# Patient Record
Sex: Male | Born: 1995
Health system: Southern US, Community
[De-identification: ages and names within clinical notes are randomized; demographics above are authoritative.]

## PROBLEM LIST (undated history)

## (undated) ENCOUNTER — Ambulatory Visit (HOSPITAL_COMMUNITY): Source: Home / Self Care

## (undated) DIAGNOSIS — Q893 Situs inversus: Secondary | ICD-10-CM

## (undated) DIAGNOSIS — R011 Cardiac murmur, unspecified: Secondary | ICD-10-CM

## (undated) DIAGNOSIS — A539 Syphilis, unspecified: Secondary | ICD-10-CM

## (undated) DIAGNOSIS — B20 Human immunodeficiency virus [HIV] disease: Secondary | ICD-10-CM

## (undated) DIAGNOSIS — J45909 Unspecified asthma, uncomplicated: Secondary | ICD-10-CM

## (undated) HISTORY — DX: Syphilis, unspecified: A53.9

## (undated) HISTORY — DX: Human immunodeficiency virus (HIV) disease: B20

---
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
## 2014-03-27 ENCOUNTER — Emergency Department (HOSPITAL_COMMUNITY)
Admission: EM | Admit: 2014-03-27 | Discharge: 2014-03-27 | Disposition: A | Discharge status: Home / Self Care | Payer: Medicaid Other | Attending: Emergency Medicine | Admitting: Emergency Medicine

## 2014-03-27 ENCOUNTER — Encounter (HOSPITAL_COMMUNITY): Payer: Self-pay | Admitting: Emergency Medicine

## 2014-03-27 ENCOUNTER — Emergency Department (HOSPITAL_COMMUNITY): Payer: Medicaid Other

## 2014-03-27 DIAGNOSIS — Z79899 Other long term (current) drug therapy: Secondary | ICD-10-CM | POA: Insufficient documentation

## 2014-03-27 DIAGNOSIS — J45901 Unspecified asthma with (acute) exacerbation: Secondary | ICD-10-CM | POA: Insufficient documentation

## 2014-03-27 DIAGNOSIS — IMO0002 Reserved for concepts with insufficient information to code with codable children: Secondary | ICD-10-CM | POA: Insufficient documentation

## 2014-03-27 DIAGNOSIS — Q893 Situs inversus: Secondary | ICD-10-CM | POA: Insufficient documentation

## 2014-03-27 DIAGNOSIS — R011 Cardiac murmur, unspecified: Secondary | ICD-10-CM | POA: Insufficient documentation

## 2014-03-27 DIAGNOSIS — J45909 Unspecified asthma, uncomplicated: Secondary | ICD-10-CM

## 2014-03-27 MED ORDER — ALBUTEROL SULFATE HFA 108 (90 BASE) MCG/ACT IN AERS: 2.0000 | INHALATION_SPRAY | RESPIRATORY_TRACT | Status: DC | PRN

## 2014-03-27 NOTE — Discharge Instructions (Signed)
Use the inhaler as needed for wheezing or shortness of breath. You can take ibuprofen 600 mg 4 times a day for fever. Recheck if you get a cough, sore throat, runny nose or struggle to breathe.  Asthma Attack Prevention Although there is no way to prevent asthma from starting, you can take steps to control the disease and reduce its symptoms. Learn about your asthma and how to control it. Take an active role to control your asthma by working with your health care provider to create and follow an asthma action plan. An asthma action plan guides you in:  Taking your medicines properly.  Avoiding things that set off your asthma or make your asthma worse (asthma triggers).  Tracking your level of asthma control.  Responding to worsening asthma.  Seeking emergency care when needed. To track your asthma, keep records of your symptoms, check your peak flow number using a handheld device that shows how well air moves out of your lungs (peak flow meter), and get regular asthma checkups.  WHAT ARE SOME WAYS TO PREVENT AN ASTHMA ATTACK?  Take medicines as directed by your health care provider.  Keep track of your asthma symptoms and level of control.  With your health care provider, write a detailed plan for taking medicines and managing an asthma attack. Then be sure to follow your action plan. Asthma is an ongoing condition that needs regular monitoring and treatment.  Identify and avoid asthma triggers. Many outdoor allergens and irritants (such as pollen, mold, cold air, and air pollution) can trigger asthma attacks. Find out what your asthma triggers are and take steps to avoid them.  Monitor your breathing. Learn to recognize warning signs of an attack, such as coughing, wheezing, or shortness of breath. Your lung function may decrease before you notice any signs or symptoms, so regularly measure and record your peak airflow with a home peak flow meter.  Identify and treat attacks early. If you  act quickly, you are less likely to have a severe attack. You will also need less medicine to control your symptoms. When your peak flow measurements decrease and alert you to an upcoming attack, take your medicine as instructed and immediately stop any activity that may have triggered the attack. If your symptoms do not improve, get medical help.  Pay attention to increasing quick-relief inhaler use. If you find yourself relying on your quick-relief inhaler, your asthma is not under control. See your health care provider about adjusting your treatment. WHAT CAN MAKE MY SYMPTOMS WORSE? A number of common things can set off or make your asthma symptoms worse and cause temporary increased inflammation of your airways. Keep track of your asthma symptoms for several weeks, detailing all the environmental and emotional factors that are linked with your asthma. When you have an asthma attack, go back to your asthma diary to see which factor, or combination of factors, might have contributed to it. Once you know what these factors are, you can take steps to control many of them. If you have allergies and asthma, it is important to take asthma prevention steps at home. Minimizing contact with the substance to which you are allergic will help prevent an asthma attack. Some triggers and ways to avoid these triggers are: Animal Dander:  Some people are allergic to the flakes of skin or dried saliva from animals with fur or feathers.   There is no such thing as a hypoallergenic dog or cat breed. All dogs or cats can cause  allergies, even if they don't shed.  Keep these pets out of your home.  If you are not able to keep a pet outdoors, keep the pet out of your bedroom and other sleeping areas at all times, and keep the door closed.  Remove carpets and furniture covered with cloth from your home. If that is not possible, keep the pet away from fabric-covered furniture and carpets. Dust Mites: Many people with  asthma are allergic to dust mites. Dust mites are tiny bugs that are found in every home in mattresses, pillows, carpets, fabric-covered furniture, bedcovers, clothes, stuffed toys, and other fabric-covered items.   Cover your mattress in a special dust-proof cover.  Cover your pillow in a special dust-proof cover, or wash the pillow each week in hot water. Water must be hotter than 130 F (54.4 C) to kill dust mites. Cold or warm water used with detergent and bleach can also be effective.  Wash the sheets and blankets on your bed each week in hot water.  Try not to sleep or lie on cloth-covered cushions.  Call ahead when traveling and ask for a smoke-free hotel room. Bring your own bedding and pillows in case the hotel only supplies feather pillows and down comforters, which may contain dust mites and cause asthma symptoms.  Remove carpets from your bedroom and those laid on concrete, if you can.  Keep stuffed toys out of the bed, or wash the toys weekly in hot water or cooler water with detergent and bleach. Cockroaches: Many people with asthma are allergic to the droppings and remains of cockroaches.   Keep food and garbage in closed containers. Never leave food out.  Use poison baits, traps, powders, gels, or paste (for example, boric acid).  If a spray is used to kill cockroaches, stay out of the room until the odor goes away. Indoor Mold:  Fix leaky faucets, pipes, or other sources of water that have mold around them.  Clean floors and moldy surfaces with a fungicide or diluted bleach.  Avoid using humidifiers, vaporizers, or swamp coolers. These can spread molds through the air. Pollen and Outdoor Mold:  When pollen or mold spore counts are high, try to keep your windows closed.  Stay indoors with windows closed from late morning to afternoon. Pollen and some mold spore counts are highest at that time.  Ask your health care provider whether you need to take  anti-inflammatory medicine or increase your dose of the medicine before your allergy season starts. Other Irritants to Avoid:  Tobacco smoke is an irritant. If you smoke, ask your health care provider how you can quit. Ask family members to quit smoking, too. Do not allow smoking in your home or car.  If possible, do not use a wood-burning stove, kerosene heater, or fireplace. Minimize exposure to all sources of smoke, including incense, candles, fires, and fireworks.  Try to stay away from strong odors and sprays, such as perfume, talcum powder, hair spray, and paints.  Decrease humidity in your home and use an indoor air cleaning device. Reduce indoor humidity to below 60%. Dehumidifiers or central air conditioners can do this.  Decrease house dust exposure by changing furnace and air cooler filters frequently.  Try to have someone else vacuum for you once or twice a week. Stay out of rooms while they are being vacuumed and for a short while afterward.  If you vacuum, use a dust mask from a hardware store, a double-layered or microfilter vacuum cleaner bag,  or a vacuum cleaner with a HEPA filter.  Sulfites in foods and beverages can be irritants. Do not drink beer or wine or eat dried fruit, processed potatoes, or shrimp if they cause asthma symptoms.  Cold air can trigger an asthma attack. Cover your nose and mouth with a scarf on cold or windy days.  Several health conditions can make asthma more difficult to manage, including a runny nose, sinus infections, reflux disease, psychological stress, and sleep apnea. Work with your health care provider to manage these conditions.  Avoid close contact with people who have a respiratory infection such as a cold or the flu, since your asthma symptoms may get worse if you catch the infection. Wash your hands thoroughly after touching items that may have been handled by people with a respiratory infection.  Get a flu shot every year to protect  against the flu virus, which often makes asthma worse for days or weeks. Also get a pneumonia shot if you have not previously had one. Unlike the flu shot, the pneumonia shot does not need to be given yearly. Medicines:  Talk to your health care provider about whether it is safe for you to take aspirin or non-steroidal anti-inflammatory medicines (NSAIDs). In a small number of people with asthma, aspirin and NSAIDs can cause asthma attacks. These medicines must be avoided by people who have known aspirin-sensitive asthma. It is important that people with aspirin-sensitive asthma read labels of all over-the-counter medicines used to treat pain, colds, coughs, and fever.  Beta-blockers and ACE inhibitors are other medicines you should discuss with your health care provider. HOW CAN I FIND OUT WHAT I AM ALLERGIC TO? Ask your asthma health care provider about allergy skin testing or blood testing (the RAST test) to identify the allergens to which you are sensitive. If you are found to have allergies, the most important thing to do is to try to avoid exposure to any allergens that you are sensitive to as much as possible. Other treatments for allergies, such as medicines and allergy shots (immunotherapy) are available.  CAN I EXERCISE? Follow your health care provider's advice regarding asthma treatment before exercising. It is important to maintain a regular exercise program, but vigorous exercise or exercise in cold, humid, or dry environments can cause asthma attacks, especially for those people who have exercise-induced asthma. Document Released: 09/14/2009 Document Revised: 10/01/2013 Document Reviewed: 04/03/2013 [employer] Patient Information 2015 [employer] [place]. This information is not intended to replace advice given to you by your health care provider. Make sure you discuss any questions you have with your health care provider.

## 2014-03-27 NOTE — ED Notes (Signed)
Having chest tightness and shortness of breath. Asthma attack before leaving home. Has been running a fever at home of 101 per patient. Used inhaler at home.

## 2014-03-27 NOTE — ED Provider Notes (Signed)
CSN: [id number]     Arrival date & time 03/27/14  1940 History   First MD Initiated Contact with Patient 03/27/14 1949 This chart was scribed for Ward GivensIva L Knapp, MD by Valera CastleSteven Perry, ED Scribe. This patient was seen in room APA01/APA01 and the patient's care was started at 8:07 PM.    Chief Complaint  Patient presents with  . Chest Pain  . Asthma   (Consider location/radiation/quality/duration/timing/severity/associated sxs/prior Treatment) HPI HPI Comments: Kelly Knight is a 18 y.o. male with h/o asthma, who presents to the Emergency Department complaining of an asthma flare up including symptoms of intermittent chest tightness, SOB, and wheezing, onset a few days ago but getting worse today. He has tried his inhaler with temporary relief, stating it helps for a few hours. He denies chest tightness, wheezing currently. He reports associated dry mouth. He states his last asthma flare up was last Winter. He denies h/o hospitalization for his asthma. He states he used to be put on steroids in his youth. He also reports associated chills and fever, with a max temperature of 102, onset last night around 7:00 PM. He denies chest pain, sore throat, rhinorrhea, cough, vomiting, diarrhea, and any other associated symptoms. He denies h/o smoking. He reports starting college Monday at [employer].   He denies having PCP due to just turning 17 and aging out at his pediatrician.  Past Medical History  Diagnosis Date  . Asthma   . Situs inversus   . Heart murmur    History reviewed. No pertinent past surgical history. No family history on file. History  Substance Use Topics  . Smoking status: Never Smoker   . Smokeless tobacco: Not on file  . Alcohol Use: No  college student  Review of Systems  Constitutional: Positive for fever (102) and chills.  HENT: Negative for rhinorrhea, sore throat and trouble swallowing.   Respiratory: Positive for chest tightness, shortness of breath and wheezing.    Cardiovascular: Negative for chest pain.  Gastrointestinal: Negative for nausea, vomiting and diarrhea.  All other systems reviewed and are negative.   Allergies  Sulfa antibiotics  Home Medications   Prior to Admission medications   Medication Sig Start Date End Date Taking? Authorizing Illona Bulman  acetaminophen (TYLENOL) 500 MG tablet Take 500 mg by mouth once as needed for pain (chest pain as a result of cough).   Yes Historical Tristain Daily, MD  albuterol (PROVENTIL HFA;VENTOLIN HFA) 108 (90 BASE) MCG/ACT inhaler Inhale 2 puffs into the lungs every 6 (six) hours as needed for wheezing.   Yes Historical Yannet Rincon, MD  albuterol (PROVENTIL) (2.5 MG/3ML) 0.083% nebulizer solution Take 2.5 mg by nebulization every 6 (six) hours as needed for wheezing.   Yes Historical Molley Houser, MD  beclomethasone (QVAR) 80 MCG/ACT inhaler Inhale 2 puffs into the lungs 2 (two) times daily.   Yes Historical Anarely Nicholls, MD  albuterol (PROVENTIL HFA;VENTOLIN HFA) 108 (90 BASE) MCG/ACT inhaler Inhale 2 puffs into the lungs every 4 (four) hours as needed. 03/27/14   Ward GivensIva L Knapp, MD   BP 122/79  Pulse 103  Temp(Src) 99.9 F (37.7 C) (Oral)  Resp 20  Ht 5\' 5"  (1.651 m)  Wt 145 lb 7 oz (65.97 kg)  BMI 24.20 kg/m2  SpO2 100%  Vital signs normal except tachycardia, low grade temp  Physical Exam  Nursing note and vitals reviewed. Constitutional: He is oriented to person, place, and time. He appears well-developed and well-nourished.  Non-toxic appearance. He does not appear ill. No distress.  HENT:  Head: Normocephalic and atraumatic.  Right Ear: External ear normal.  Left Ear: External ear normal.  Nose: Nose normal. No mucosal edema or rhinorrhea.  Mouth/Throat: Oropharynx is clear and moist. Mucous membranes are dry. No dental abscesses or uvula swelling.  Mouth is dry.  Eyes: Conjunctivae and EOM are normal. Pupils are equal, round, and reactive to light.  Neck: Normal range of motion and full passive range  of motion without pain. Neck supple.  Cardiovascular: Normal rate and regular rhythm.  Exam reveals no gallop and no friction rub.   Murmur (faint short systolic murmur heard base left lower sternal border) heard.  Systolic murmur is present  Pulmonary/Chest: Effort normal and breath sounds normal. No respiratory distress. He has no wheezes. He has no rhonchi. He has no rales. He exhibits no tenderness and no crepitus.  Abdominal: Soft. Normal appearance and bowel sounds are normal. He exhibits no distension. There is no tenderness. There is no rebound and no guarding.  Musculoskeletal: Normal range of motion. He exhibits no edema and no tenderness.  Moves all extremities well.   Neurological: He is alert and oriented to person, place, and time. He has normal strength. No cranial nerve deficit.  Skin: Skin is warm, dry and intact. No rash noted. No erythema. No pallor.  Psychiatric: He has a normal mood and affect. His speech is normal and behavior is normal. His mood appears not anxious.    ED Course  Procedures (including critical care time)  Medications - No data to display  DIAGNOSTIC STUDIES: Oxygen Saturation is 100% on room air, normal by my interpretation.    COORDINATION OF CARE: 8:14 PM-Discussed treatment plan which includes CXR with pt at bedside and pt agreed to plan. Patient agrees that he does not have any wheezing for any chest pain at this time. He does not want a breathing treatment at this time.  9:37 PM - Pt reports feeling improved. Discussed negative imaging results with pt. He denies chest tightness currently. Pt requests a Rx for another inhaler.   Dg Chest 2 View  03/27/2014   CLINICAL DATA:  Mid chest pain for 2 days. Fever. History of asthma.  EXAM: CHEST  2 VIEW  COMPARISON:  Chest radiograph performed 07/01/2005  FINDINGS: The lungs are well-aerated and clear. There is no evidence of focal opacification, pleural effusion or pneumothorax.  The heart is normal  in size; the mediastinal contour is within normal limits. No acute osseous abnormalities are seen.  IMPRESSION: No acute cardiopulmonary process seen.   Electronically Signed   By: Roanna RaiderJeffery  Chang M.D.   On: 03/27/2014 21:14     EKG Interpretation   Date/Time:  Thursday March 27 2014 19:54:03 EDT Ventricular Rate:  88 PR Interval:  158 QRS Duration: 83 QT Interval:  323 QTC Calculation: 391 R Axis:   102 Text Interpretation:  Sinus rhythm Borderline right axis deviation  Abnormal Q suggests anterior infarct Borderline T abnormalities, inferior  leads No old tracing to compare Confirmed by KNAPP  MD-I, IVA ([id number]) on  03/27/2014 9:38:42 PM      MDM   Final diagnoses:  Asthma    I personally performed the services described in this documentation, which was scribed in my presence. The recorded information has been reviewed and considered.  Devoria AlbeIva Knapp, MD, [employer]     Ward GivensIva L Knapp, MD 03/27/14 [phone]

## 2014-07-15 ENCOUNTER — Encounter (HOSPITAL_COMMUNITY): Payer: Self-pay | Admitting: Emergency Medicine

## 2014-07-15 ENCOUNTER — Emergency Department (HOSPITAL_COMMUNITY)
Admission: EM | Admit: 2014-07-15 | Discharge: 2014-07-15 | Disposition: A | Discharge status: Home / Self Care | Payer: Medicaid Other | Attending: Emergency Medicine | Admitting: Emergency Medicine

## 2014-07-15 DIAGNOSIS — Z79899 Other long term (current) drug therapy: Secondary | ICD-10-CM | POA: Insufficient documentation

## 2014-07-15 DIAGNOSIS — Q893 Situs inversus: Secondary | ICD-10-CM | POA: Insufficient documentation

## 2014-07-15 DIAGNOSIS — R011 Cardiac murmur, unspecified: Secondary | ICD-10-CM | POA: Insufficient documentation

## 2014-07-15 DIAGNOSIS — L02412 Cutaneous abscess of left axilla: Secondary | ICD-10-CM | POA: Insufficient documentation

## 2014-07-15 DIAGNOSIS — J45909 Unspecified asthma, uncomplicated: Secondary | ICD-10-CM | POA: Insufficient documentation

## 2014-07-15 DIAGNOSIS — Z7951 Long term (current) use of inhaled steroids: Secondary | ICD-10-CM | POA: Insufficient documentation

## 2014-07-15 MED ORDER — DOXYCYCLINE HYCLATE 100 MG PO CAPS: 100.0000 mg | ORAL_CAPSULE | Freq: Two times a day (BID) | ORAL | Status: DC

## 2014-07-15 MED ORDER — HYDROCODONE-ACETAMINOPHEN 5-325 MG PO TABS: ORAL_TABLET | ORAL | Status: DC

## 2014-07-15 MED ORDER — LIDOCAINE HCL (PF) 2 % IJ SOLN
10.0000 mL | Freq: Once | INTRAMUSCULAR | Status: DC
Start: 2014-07-15 — End: 2014-07-15
  Filled 2014-07-15: qty 10

## 2014-07-15 NOTE — ED Notes (Signed)
Abscess under left arm x 4 days

## 2014-07-15 NOTE — ED Notes (Signed)
Suture tray set up at bedside

## 2014-07-15 NOTE — ED Provider Notes (Signed)
CSN: [id number]     Arrival date & time 07/15/14  1249 History   First MD Initiated Contact with Patient 07/15/14 1259     Chief Complaint  Patient presents with  . Abscess     (Consider location/radiation/quality/duration/timing/severity/associated sxs/prior Treatment) HPI   Kelly Knight is a 18 y.o. male who presents to the Emergency Department complaining of pain, redness, and swelling of the left axilla for 4 days. He states that he has tried over-the-counter boil medication and warm compresses without relief. He states pain is worse with movement of his arm and improves if he keeps his arm above his head. He denies any fever, chills, nausea or vomiting, numbness or weakness to his arm. He he also denies use of new deodorant or  Past Medical History  Diagnosis Date  . Asthma   . Situs inversus   . Heart murmur    History reviewed. No pertinent past surgical history. No family history on file. History  Substance Use Topics  . Smoking status: Never Smoker   . Smokeless tobacco: Not on file  . Alcohol Use: No    Review of Systems  Constitutional: Negative for fever and chills.  Gastrointestinal: Negative for nausea and vomiting.  Musculoskeletal: Negative for arthralgias and joint swelling.  Skin: Positive for color change.       Abscess   Hematological: Negative for adenopathy.  All other systems reviewed and are negative.     Allergies  Sulfa antibiotics  Home Medications   Prior to Admission medications   Medication Sig Start Date End Date Taking? Authorizing Provider  acetaminophen (TYLENOL) 500 MG tablet Take 500 mg by mouth every 6 (six) hours as needed (pain).    Yes Historical Provider, MD  albuterol (PROVENTIL HFA;VENTOLIN HFA) 108 (90 BASE) MCG/ACT inhaler Inhale 2 puffs into the lungs every 6 (six) hours as needed for wheezing.   Yes Historical Provider, MD  albuterol (PROVENTIL) (2.5 MG/3ML) 0.083% nebulizer solution Take 2.5 mg by nebulization  every 6 (six) hours as needed for wheezing.   Yes Historical Provider, MD  beclomethasone (QVAR) 80 MCG/ACT inhaler Inhale 2 puffs into the lungs 2 (two) times daily.   Yes Historical Provider, MD  doxycycline (VIBRAMYCIN) 100 MG capsule Take 1 capsule (100 mg total) by mouth 2 (two) times daily. For 10 days 07/15/14   Summer Mccolgan L. Yusra Ravert, PA-C  HYDROcodone-acetaminophen (NORCO/VICODIN) 5-325 MG per tablet Take one-two tabs po q 4-6 hrs prn pain 07/15/14   Nikia Levels L. Eulas Schweitzer, PA-C   BP 124/66  Pulse 84  Temp(Src) 98.6 F (37 C) (Oral)  Resp 16  SpO2 100% Physical Exam  Nursing note and vitals reviewed. Constitutional: He is oriented to person, place, and time. He appears well-developed and well-nourished. No distress.  HENT:  Head: Normocephalic and atraumatic.  Cardiovascular: Normal rate, regular rhythm, normal heart sounds and intact distal pulses.   No murmur heard. Pulmonary/Chest: Effort normal and breath sounds normal. No respiratory distress.  Neurological: He is alert and oriented to person, place, and time. He exhibits normal muscle tone. Coordination normal.  Skin: Skin is warm and dry. There is erythema.  Abscess to the left axilla. Moderate fluctuance. Minimal erythema, no drainage    ED Course  Procedures (including critical care time) Labs Review Labs Reviewed - No data to display  Imaging Review No results found.   EKG Interpretation None       INCISION AND DRAINAGE Performed by: Maxwell CaulRIPLETT,Nehal Witting L. Consent: Verbal consent obtained. Risks and  benefits: risks, benefits and alternatives were discussed Type: abscess  Body area: Left axilla Anesthesia: local infiltration  Incision was made with a #11 scalpel.  Local anesthetic: lidocaine 2 % without epinephrine  Anesthetic total: 3 ml  Complexity: complex Blunt dissection to break up loculations  Drainage: purulent  Drainage amount: Copious  Packing material: 1/4 in iodoform gauze  Patient tolerance:  Patient tolerated the procedure well with no immediate complications.    MDM   Final diagnoses:  Abscess of axilla, left   Patient is well appearing. Vital signs are stable. He is nontoxic appearing. Localized abscess to the left axilla without significant cellulitis. He agrees to keep the area bandaged and packing to be removed in 2 days. Prescription for Vicodin and doxycycline. Patient agrees to return here for any worsening symptoms.     Annamae Shivley L. Dezmond Downie, PA-C 07/15/14 1425

## 2014-07-15 NOTE — Discharge Instructions (Signed)
Abscess °An abscess (boil or furuncle) is an infected area on or under the skin. This area is filled with yellowish-white fluid (pus) and other material (debris). °HOME CARE  °· Only take medicines as told by your doctor. °· If you were given antibiotic medicine, take it as directed. Finish the medicine even if you start to feel better. °· If gauze is used, follow your doctor's directions for changing the gauze. °· To avoid spreading the infection: °¨ Keep your abscess covered with a bandage. °¨ Wash your hands well. °¨ Do not share personal care items, towels, or whirlpools with others. °¨ Avoid skin contact with others. °· Keep your skin and clothes clean around the abscess. °· Keep all doctor visits as told. °GET HELP RIGHT AWAY IF:  °· You have more pain, puffiness (swelling), or redness in the wound site. °· You have more fluid or blood coming from the wound site. °· You have muscle aches, chills, or you feel sick. °· You have a fever. °MAKE SURE YOU:  °· Understand these instructions. °· Will watch your condition. °· Will get help right away if you are not doing well or get worse. °Document Released: 03/14/2008 Document Revised: 03/27/2012 Document Reviewed: 12/09/2011 °[employer]® Patient Information ©2015 [employer]. This information is not intended to replace advice given to you by your health care provider. Make sure you discuss any questions you have with your health care provider. ° °

## 2014-07-15 NOTE — ED Notes (Signed)
Telfa Dressing applied.Patient given discharge instruction, verbalized understand. Patient ambulatory out of the department.

## 2014-07-15 NOTE — ED Notes (Signed)
T Triplett at the bedside to lance abscess

## 2014-07-16 NOTE — ED Provider Notes (Signed)
Medical screening examination/treatment/procedure(s) were performed by non-physician practitioner and as supervising physician I was immediately available for consultation/collaboration.   EKG Interpretation None       Kamla Skilton, MD 07/16/14 0730 

## 2017-02-09 ENCOUNTER — Emergency Department (HOSPITAL_COMMUNITY): Payer: BLUE CROSS/BLUE SHIELD

## 2017-02-09 ENCOUNTER — Encounter (HOSPITAL_COMMUNITY): Payer: Self-pay | Admitting: *Deleted

## 2017-02-09 ENCOUNTER — Emergency Department (HOSPITAL_COMMUNITY)
Admission: EM | Admit: 2017-02-09 | Discharge: 2017-02-09 | Disposition: A | Discharge status: Home / Self Care | Payer: BLUE CROSS/BLUE SHIELD | Attending: Emergency Medicine | Admitting: Emergency Medicine

## 2017-02-09 DIAGNOSIS — Y92410 Unspecified street and highway as the place of occurrence of the external cause: Secondary | ICD-10-CM | POA: Insufficient documentation

## 2017-02-09 DIAGNOSIS — Y999 Unspecified external cause status: Secondary | ICD-10-CM | POA: Insufficient documentation

## 2017-02-09 DIAGNOSIS — S0990XA Unspecified injury of head, initial encounter: Secondary | ICD-10-CM | POA: Diagnosis not present

## 2017-02-09 DIAGNOSIS — S46812A Strain of other muscles, fascia and tendons at shoulder and upper arm level, left arm, initial encounter: Secondary | ICD-10-CM | POA: Insufficient documentation

## 2017-02-09 DIAGNOSIS — Z79899 Other long term (current) drug therapy: Secondary | ICD-10-CM | POA: Diagnosis not present

## 2017-02-09 DIAGNOSIS — Y9389 Activity, other specified: Secondary | ICD-10-CM | POA: Diagnosis not present

## 2017-02-09 DIAGNOSIS — S46811A Strain of other muscles, fascia and tendons at shoulder and upper arm level, right arm, initial encounter: Secondary | ICD-10-CM | POA: Diagnosis not present

## 2017-02-09 DIAGNOSIS — M25561 Pain in right knee: Secondary | ICD-10-CM | POA: Insufficient documentation

## 2017-02-09 DIAGNOSIS — S4991XA Unspecified injury of right shoulder and upper arm, initial encounter: Secondary | ICD-10-CM | POA: Diagnosis present

## 2017-02-09 DIAGNOSIS — S0083XA Contusion of other part of head, initial encounter: Secondary | ICD-10-CM

## 2017-02-09 DIAGNOSIS — J45909 Unspecified asthma, uncomplicated: Secondary | ICD-10-CM | POA: Insufficient documentation

## 2017-02-09 DIAGNOSIS — T148XXA Other injury of unspecified body region, initial encounter: Secondary | ICD-10-CM

## 2017-02-09 LAB — CBG MONITORING, ED: Glucose-Capillary: 88 mg/dL (ref 65–99)

## 2017-02-09 MED ORDER — CYCLOBENZAPRINE HCL 10 MG PO TABS
10.0000 mg | ORAL_TABLET | Freq: Once | ORAL | Status: AC
  Administered 2017-02-09: 10 mg via ORAL
  Filled 2017-02-09: qty 1

## 2017-02-09 MED ORDER — IBUPROFEN 800 MG PO TABS
800.0000 mg | ORAL_TABLET | Freq: Once | ORAL | Status: AC
  Administered 2017-02-09: 800 mg via ORAL
  Filled 2017-02-09: qty 1

## 2017-02-09 MED ORDER — CYCLOBENZAPRINE HCL 10 MG PO TABS: 10.0000 mg | ORAL_TABLET | Freq: Three times a day (TID) | ORAL | 0 refills | Status: DC

## 2017-02-09 MED ORDER — IBUPROFEN 600 MG PO TABS: 600.0000 mg | ORAL_TABLET | Freq: Four times a day (QID) | ORAL | 0 refills | Status: DC

## 2017-02-09 NOTE — ED Notes (Signed)
Patient transported to CT 
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
## 2017-02-09 NOTE — ED Notes (Signed)
Patient transported to X-ray 
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
## 2017-02-14 ENCOUNTER — Ambulatory Visit: Payer: Self-pay | Admitting: Family Medicine

## 2017-03-02 ENCOUNTER — Emergency Department (HOSPITAL_COMMUNITY)
Admission: EM | Admit: 2017-03-02 | Discharge: 2017-03-02 | Disposition: A | Discharge status: Home / Self Care | Payer: BLUE CROSS/BLUE SHIELD | Attending: Emergency Medicine | Admitting: Emergency Medicine

## 2017-03-02 ENCOUNTER — Encounter (HOSPITAL_COMMUNITY): Payer: Self-pay

## 2017-03-02 DIAGNOSIS — K0889 Other specified disorders of teeth and supporting structures: Secondary | ICD-10-CM | POA: Diagnosis present

## 2017-03-02 DIAGNOSIS — K029 Dental caries, unspecified: Secondary | ICD-10-CM | POA: Insufficient documentation

## 2017-03-02 DIAGNOSIS — J45909 Unspecified asthma, uncomplicated: Secondary | ICD-10-CM | POA: Diagnosis not present

## 2017-03-02 MED ORDER — IBUPROFEN 600 MG PO TABS: 600.0000 mg | ORAL_TABLET | Freq: Four times a day (QID) | ORAL | 0 refills | Status: DC | PRN

## 2017-03-02 MED ORDER — PENICILLIN V POTASSIUM 500 MG PO TABS: 500.0000 mg | ORAL_TABLET | Freq: Four times a day (QID) | ORAL | 0 refills | Status: AC

## 2017-03-02 NOTE — Discharge Instructions (Signed)
Keep your dental appt for next week.  Warm salt water rinses. °

## 2017-03-02 NOTE — ED Provider Notes (Signed)
[facility] Provider Note   CSN: [id number] Arrival date & time: 03/02/17  1044     History   Chief Complaint Chief Complaint  Patient presents with  . Dental Pain    HPI Kelly Knight is a 21 y.o. male.  HPI   Kelly Knight is a 21 y.o. male who presents to the Emergency Department complaining of dental pain for nearly one week.  Pain has been increasing in severity.  Taking ibuprofen with some improvement.  Describes a sharp pain to left lower molars that radiates to left face and ear.  Worse with chewing.  He denies facial swelling, fever, chills, neck pain or difficulty swallowing.   Past Medical History:  Diagnosis Date  . Asthma   . Heart murmur   . Situs inversus     There are no active problems to display for this patient.   History reviewed. No pertinent surgical history.     Home Medications    Prior to Admission medications   Medication Sig Start Date End Date Taking? Authorizing Provider  acetaminophen (TYLENOL) 500 MG tablet Take 500 mg by mouth every 6 (six) hours as needed (pain).     [provider]  albuterol (PROVENTIL HFA;VENTOLIN HFA) 108 (90 BASE) MCG/ACT inhaler Inhale 2 puffs into the lungs every 6 (six) hours as needed for wheezing.    [provider]  albuterol (PROVENTIL) (2.5 MG/3ML) 0.083% nebulizer solution Take 2.5 mg by nebulization every 6 (six) hours as needed for wheezing.    [provider]  beclomethasone (QVAR) 80 MCG/ACT inhaler Inhale 2 puffs into the lungs 2 (two) times daily.    [provider]  cyclobenzaprine (FLEXERIL) 10 MG tablet Take 1 tablet (10 mg total) by mouth 3 (three) times daily. 02/09/17   Ivery Quale, PA-C  doxycycline (VIBRAMYCIN) 100 MG capsule Take 1 capsule (100 mg total) by mouth 2 (two) times daily. For 10 days 07/15/14   Pauline Aus, PA-C  HYDROcodone-acetaminophen (NORCO/VICODIN) 5-325 MG per tablet Take one-two tabs po q 4-6 hrs prn pain 07/15/14    Alexsis Branscom, PA-C  ibuprofen (ADVIL,MOTRIN) 600 MG tablet Take 1 tablet (600 mg total) by mouth 4 (four) times daily. 02/09/17   Ivery Quale, PA-C    Family History No family history on file.  Social History Social History  Substance Use Topics  . Smoking status: Never Smoker  . Smokeless tobacco: Never Used  . Alcohol use No     Allergies   Shrimp [shellfish allergy] and Sulfa antibiotics   Review of Systems Review of Systems  Constitutional: Negative for appetite change and fever.  HENT: Positive for dental problem. Negative for congestion, facial swelling, sore throat and trouble swallowing.   Eyes: Negative for pain and visual disturbance.  Musculoskeletal: Negative for neck pain and neck stiffness.  Neurological: Negative for dizziness, facial asymmetry and headaches.  Hematological: Negative for adenopathy.  All other systems reviewed and are negative.    Physical Exam Updated Vital Signs BP 136/86 (BP Location: Right Arm)   Pulse 93   Temp 98 F (36.7 C) (Oral)   Resp 15   Ht 5\' 10"  (1.778 m)   Wt 63.5 kg (140 lb)   SpO2 100%   BMI 20.09 kg/m   Physical Exam  Constitutional: He is oriented to person, place, and time. He appears well-developed and well-nourished. No distress.  HENT:  Head: Normocephalic and atraumatic.  Right Ear: Tympanic membrane and ear canal normal.  Left Ear:  Tympanic membrane and ear canal normal.  Mouth/Throat: Uvula is midline, oropharynx is clear and moist and mucous membranes are normal. No trismus in the jaw. Dental caries present. No dental abscesses or uvula swelling.  ttp of the left lower second and third molars. No obvious decay.  No facial swelling, obvious dental abscess, trismus, or sublingual abnml.    Neck: Normal range of motion. Neck supple.  Cardiovascular: Normal rate, regular rhythm and normal heart sounds.   No murmur heard. Pulmonary/Chest: Effort normal and breath sounds normal.  Musculoskeletal: Normal  range of motion.  Lymphadenopathy:    He has no cervical adenopathy.  Neurological: He is alert and oriented to person, place, and time. He exhibits normal muscle tone. Coordination normal.  Skin: Skin is warm and dry.  Nursing note and vitals reviewed.    ED Treatments / Results  Labs (all labs ordered are listed, but only abnormal results are displayed) Labs Reviewed - No data to display  EKG  EKG Interpretation None       Radiology No results found.  Procedures Procedures (including critical care time)  Medications Ordered in ED Medications - No data to display   Initial Impression / Assessment and Plan / ED Course  I have reviewed the triage vital signs and the nursing notes.  Pertinent labs & imaging results that were available during my care of the patient were reviewed by me and considered in my medical decision making (see chart for details).    Pt well appearing.  Airway patent.  No concerning sx's for Ludwig's angina or dental abscess.  Pt agrees to f/u with his dentist.     Final Clinical Impressions(s) / ED Diagnoses   Final diagnoses:  Pain, dental    New Prescriptions New Prescriptions   No medications on file     Pauline Ausriplett, Toby Breithaupt, Cordelia Poche-C 03/04/17 1749    Mancel BaleWentz, Elliott, MD 03/07/17 1319

## 2017-03-02 NOTE — ED Triage Notes (Signed)
Pt. Is complaining of lower wisdom tooth pain on the left side. States pain started on Friday and has taken 600mg  ibuprofen for the pain, but hasn't had any relief. No drainage coming from tooth, just extreme pain.

## 2018-12-21 ENCOUNTER — Other Ambulatory Visit: Payer: Self-pay | Admitting: *Deleted

## 2018-12-21 ENCOUNTER — Ambulatory Visit: Payer: Self-pay

## 2018-12-21 ENCOUNTER — Other Ambulatory Visit: Payer: Self-pay

## 2018-12-21 DIAGNOSIS — B20 Human immunodeficiency virus [HIV] disease: Secondary | ICD-10-CM

## 2018-12-21 DIAGNOSIS — Z113 Encounter for screening for infections with a predominantly sexual mode of transmission: Secondary | ICD-10-CM

## 2018-12-21 DIAGNOSIS — Z79899 Other long term (current) drug therapy: Secondary | ICD-10-CM

## 2019-01-17 ENCOUNTER — Other Ambulatory Visit (HOSPITAL_COMMUNITY)
Admission: RE | Admit: 2019-01-17 | Discharge: 2019-01-17 | Disposition: A | Discharge status: Home / Self Care | Payer: Medicaid Other | Source: Ambulatory Visit | Attending: Infectious Diseases | Admitting: Infectious Diseases

## 2019-01-17 ENCOUNTER — Ambulatory Visit (INDEPENDENT_AMBULATORY_CARE_PROVIDER_SITE_OTHER): Payer: Self-pay | Admitting: Pharmacist

## 2019-01-17 ENCOUNTER — Ambulatory Visit (INDEPENDENT_AMBULATORY_CARE_PROVIDER_SITE_OTHER): Payer: Self-pay | Admitting: Infectious Diseases

## 2019-01-17 ENCOUNTER — Encounter: Payer: Self-pay | Admitting: Infectious Diseases

## 2019-01-17 ENCOUNTER — Telehealth: Payer: Self-pay | Admitting: Pharmacy Technician

## 2019-01-17 ENCOUNTER — Other Ambulatory Visit: Payer: Self-pay

## 2019-01-17 VITALS — BP 130/80 | HR 85 | Temp 98.3°F | Wt 164.0 lb

## 2019-01-17 DIAGNOSIS — B20 Human immunodeficiency virus [HIV] disease: Secondary | ICD-10-CM | POA: Insufficient documentation

## 2019-01-17 DIAGNOSIS — Z113 Encounter for screening for infections with a predominantly sexual mode of transmission: Secondary | ICD-10-CM

## 2019-01-17 DIAGNOSIS — Z79899 Other long term (current) drug therapy: Secondary | ICD-10-CM

## 2019-01-17 DIAGNOSIS — A539 Syphilis, unspecified: Secondary | ICD-10-CM

## 2019-01-17 MED ORDER — EMTRICITABINE-TENOFOVIR AF 200-25 MG PO TABS: 1.0000 | ORAL_TABLET | Freq: Every day | ORAL | 5 refills | Status: DC

## 2019-01-17 MED ORDER — DOLUTEGRAVIR SODIUM 50 MG PO TABS: 50.0000 mg | ORAL_TABLET | Freq: Every day | ORAL | 5 refills | Status: DC

## 2019-01-17 MED FILL — DESCOVY 200-25 MG TABS: 200-25 | 30 days supply | Qty: 30 | Fill #0

## 2019-01-17 MED FILL — TIVICAY 50 MG TABLET: 50 | 30 days supply | Qty: 30 | Fill #0

## 2019-01-17 NOTE — Progress Notes (Signed)
HPI: Kelly Knight is a 23 y.o. male who presents to the [facility] clinic today to initiate care with Judeth CornfieldStephanie for his newly diagnosed HIV infection.  There are no active problems to display for this patient.   Patient's Medications  New Prescriptions   No medications on file  Previous Medications   ACETAMINOPHEN (TYLENOL) 500 MG TABLET    Take 500 mg by mouth every 6 (six) hours as needed (pain).    ALBUTEROL (PROVENTIL HFA;VENTOLIN HFA) 108 (90 BASE) MCG/ACT INHALER    Inhale 2 puffs into the lungs every 6 (six) hours as needed for wheezing.   ALBUTEROL (PROVENTIL) (2.5 MG/3ML) 0.083% NEBULIZER SOLUTION    Take 2.5 mg by nebulization every 6 (six) hours as needed for wheezing.   BECLOMETHASONE (QVAR) 80 MCG/ACT INHALER    Inhale 2 puffs into the lungs 2 (two) times daily.   CYCLOBENZAPRINE (FLEXERIL) 10 MG TABLET    Take 1 tablet (10 mg total) by mouth 3 (three) times daily.   DOLUTEGRAVIR (TIVICAY) 50 MG TABLET    Take 1 tablet (50 mg total) by mouth daily.   DOXYCYCLINE (VIBRAMYCIN) 100 MG CAPSULE    Take 1 capsule (100 mg total) by mouth 2 (two) times daily. For 10 days   EMTRICITABINE-TENOFOVIR AF (DESCOVY) 200-25 MG TABLET    Take 1 tablet by mouth daily.   HYDROCODONE-ACETAMINOPHEN (NORCO/VICODIN) 5-325 MG PER TABLET    Take one-two tabs po q 4-6 hrs prn pain   IBUPROFEN (ADVIL,MOTRIN) 600 MG TABLET    Take 1 tablet (600 mg total) by mouth every 6 (six) hours as needed.  Modified Medications   No medications on file  Discontinued Medications   No medications on file    Allergies: Allergies  Allergen Reactions  . Shrimp [Shellfish Allergy]   . Sulfa Antibiotics Rash    Past Medical History: Past Medical History:  Diagnosis Date  . Asthma   . Heart murmur   . Situs inversus     Social History: Social History   Socioeconomic History  . Marital status: Single    Spouse name: Not on file  . Number of children: Not on file  . Years of education: Not on file  .  Highest education level: Not on file  Occupational History  . Not on file  Social Needs  . Financial resource strain: Not on file  . Food insecurity:    Worry: Not on file    Inability: Not on file  . Transportation needs:    Medical: Not on file    Non-medical: Not on file  Tobacco Use  . Smoking status: Never Smoker  . Smokeless tobacco: Never Used  Substance and Sexual Activity  . Alcohol use: No    Comment: socially   . Drug use: No  . Sexual activity: Not Currently    Partners: Male  Lifestyle  . Physical activity:    Days per week: Not on file    Minutes per session: Not on file  . Stress: Not on file  Relationships  . Social connections:    Talks on phone: Not on file    Gets together: Not on file    Attends religious service: Not on file    Active member of club or organization: Not on file    Attends meetings of clubs or organizations: Not on file    Relationship status: Not on file  Other Topics Concern  . Not on file  Social History Narrative  .  Not on file    Labs: No results found for: HIV1RNAQUANT, HIV1RNAVL, CD4TABS  RPR and STI No results found for: LABRPR, RPRTITER  No flowsheet data found.  Hepatitis B No results found for: HEPBSAB, HEPBSAG, HEPBCAB Hepatitis C No results found for: HEPCAB, HCVRNAPCRQN Hepatitis A No results found for: HAV Lipids: No results found for: CHOL, TRIG, HDL, CHOLHDL, VLDL, LDLCALC  Current HIV Regimen: Treatment naive  Assessment: Kelly Knight is here today to initiate care with Judeth Cornfield for his newly diagnosed HIV infection.  He is treatment naive but we do not have any current labs on him yet as he missed his initial lab appointment a few weeks ago.  He will get labs drawn today.  He has issues with swallowing pills and is requesting something small or something that can be crushed.  Will start him on Tivicay and Descovy.  Explained that Tivicay and Descovy are two pills once daily with or without food.  Explained to him the importance of not missing any doses. Explained resistance and how it develops and why it is so important to take both medications daily and not skip days or doses. Counseled on the importance of never taking one without the other. Counseled him to take it around the same time each day. Counseled on what to do if dose is missed, if closer to missed dose take immediately, if closer to next dose then skip and resume normal schedule.   Cautioned on possible side effects the first week or so including nausea, diarrhea, dizziness, and headaches but that they should resolve after the first couple of weeks. I reviewed patient medications and found no drug interactions. Counseled patient to separate Tivicay from divalent cations including multivitamins. Discussed with patient to call clinic if he starts a new medication or herbal supplement. I gave the patient my card and told him to call me with any issues/questions/concerns.  He is uninsured and applied for HMAP today. Kathie Rhodes was able to get him a 30 day supply of both Tivicay and Descovy and he will go pick up today at [facility].  Plan: - Start Tivicay and Descovy once daily  Isak Sotomayor L. Gildardo Tickner, PharmD, BCIDP, AAHIVP, CPP Infectious Diseases Clinical Pharmacist [facility] 01/17/2019, 3:43 PM

## 2019-01-17 NOTE — Progress Notes (Signed)
Name: Kelly Knight  DOB: December 24, 1995 MRN: [id number] PCP: Patient, No Pcp Per    Patient Active Problem List   Diagnosis Date Noted   HIV (human immunodeficiency virus infection) ([place]) 01/23/2019   Syphilis 01/23/2019     Brief Narrative:  Kelly Knight is a 23 y.o. male with HIV disease, Dx 10/2018. CD4 nadir unknown.  HIV Risk: MSM History of OIs: none Entry labs not yet drawn  Previous Regimens:  naive  Genotypes:  Not yet drawn   Subjective:   Chief Complaint  Patient presents with   New Patient (Initial Visit)     HPI: Kelly Knight is in for his first visit for HIV care. He was diagnosed formally in January of 2020 in the setting of routine asymptomatic screening for STIs through the [facility]. He reports that he is a versatile MSM partner with inconsistent condom use. He is not currently sexually active and has not been since finding out about his HIV test. He reports that he has had a little bit of a hard time coming to grips with this diagnosis but feels better after discussing with some friends whom are on treatment and after doing a little research on his own. He reports that he often has a hard time swallowing pills and sometimes needs to crush medications. He has not yet discussed results with his parents but has discussed with his aunt whom is very supportive. He would like to bring her at future visit if possible. He reports no symptoms or concerns today suggestive of associated opportunistic infection or advancing HIV disease such as fevers, night sweats, weight loss, anorexia, cough, SOB, nausea, vomiting, diarrhea, headache, sensory changes, lymphadenopathy or oral thrush.    He has not yet been treated for his syphilis infection but plans to go to [facility] this week for treatment. He has no symptoms and denies ever having had any distinct rash but does recall a genital ulcer in the past but cannot recall specifically when. Denies any rashes, vision changes, headaches,  ringing in the ears, dizziness or neck pain.    Depression screen PHQ 2/9 01/17/2019  Decreased Interest 0  Down, Depressed, Hopeless 1  PHQ - 2 Score 1   He does not have PCP. Uncertain as to childhood vaccine history but was born in [place] and thinks he got all his shots. No other significant health history.   Review of Systems  Constitutional: Negative for chills, fever, malaise/fatigue and weight loss.  HENT: Negative for sore throat.   Respiratory: Negative for cough, sputum production and shortness of breath.   Cardiovascular: Negative.   Gastrointestinal: Negative for abdominal pain, diarrhea and vomiting.  Musculoskeletal: Negative for joint pain, myalgias and neck pain.  Skin: Negative for rash.  Neurological: Negative for headaches.  Psychiatric/Behavioral: Negative for depression and substance abuse. The patient is not nervous/anxious.     Past Medical History:  Diagnosis Date   Asthma    Heart murmur    HIV (human immunodeficiency virus infection) ([place]) 01/23/2019   Situs inversus    Syphilis 01/23/2019    Outpatient Medications Prior to Visit  Medication Sig Dispense Refill   acetaminophen (TYLENOL) 500 MG tablet Take 500 mg by mouth every 6 (six) hours as needed (pain).      albuterol (PROVENTIL HFA;VENTOLIN HFA) 108 (90 BASE) MCG/ACT inhaler Inhale 2 puffs into the lungs every 6 (six) hours as needed for wheezing.     albuterol (PROVENTIL) (2.5 MG/3ML) 0.083% nebulizer solution Take 2.5 mg  by nebulization every 6 (six) hours as needed for wheezing.     ibuprofen (ADVIL,MOTRIN) 600 MG tablet Take 1 tablet (600 mg total) by mouth every 6 (six) hours as needed. 30 tablet 0   beclomethasone (QVAR) 80 MCG/ACT inhaler Inhale 2 puffs into the lungs 2 (two) times daily.     cyclobenzaprine (FLEXERIL) 10 MG tablet Take 1 tablet (10 mg total) by mouth 3 (three) times daily. (Patient not taking: Reported on 01/17/2019) 20 tablet 0   doxycycline (VIBRAMYCIN) 100 MG capsule  Take 1 capsule (100 mg total) by mouth 2 (two) times daily. For 10 days (Patient not taking: Reported on 01/17/2019) 20 capsule 0   HYDROcodone-acetaminophen (NORCO/VICODIN) 5-325 MG per tablet Take one-two tabs po q 4-6 hrs prn pain (Patient not taking: Reported on 01/17/2019) 20 tablet 0   No facility-administered medications prior to visit.      Allergies  Allergen Reactions   Shrimp [Shellfish Allergy]    Sulfa Antibiotics Rash    Social History   Tobacco Use   Smoking status: Never Smoker   Smokeless tobacco: Never Used  Substance Use Topics   Alcohol use: No    Comment: socially    Drug use: No    Family History  Problem Relation Age of Onset   Diabetes Mother    Hypertension Mother    Hypertension Father     Social History   Substance and Sexual Activity  Sexual Activity Not Currently   Partners: Male     Objective:   Vitals:   01/17/19 1356  BP: 130/80  Pulse: 85  Temp: 98.3 F (36.8 C)  TempSrc: Oral  Weight: 164 lb (74.4 kg)   Body mass index is 23.53 kg/m.  Physical Exam Constitutional:      Appearance: He is well-developed.     Comments: Seated comfortably in chair during visit.   HENT:     Mouth/Throat:     Mouth: Mucous membranes are moist.     Dentition: Normal dentition. No dental abscesses.     Pharynx: Oropharynx is clear. No oropharyngeal exudate.  Eyes:     General: No scleral icterus.    Conjunctiva/sclera: Conjunctivae normal.     Pupils: Pupils are equal, round, and reactive to light.  Cardiovascular:     Rate and Rhythm: Normal rate and regular rhythm.     Heart sounds: Normal heart sounds.  Pulmonary:     Effort: Pulmonary effort is normal.     Breath sounds: Normal breath sounds.  Abdominal:     General: There is no distension.     Palpations: Abdomen is soft.     Tenderness: There is no abdominal tenderness.  Lymphadenopathy:     Cervical: No cervical adenopathy.  Skin:    General: Skin is warm and dry.      Capillary Refill: Capillary refill takes less than 2 seconds.     Findings: No rash.  Neurological:     Mental Status: He is alert and oriented to person, place, and time.  Psychiatric:        Judgment: Judgment normal.     Comments: In good spirits today and engaged in care discussion.      Lab Results Lab Results  Component Value Date   WBC 6.4 01/17/2019   HGB 13.1 (L) 01/17/2019   HCT 39.8 01/17/2019   MCV 82.9 01/17/2019   PLT 235 01/17/2019    Lab Results  Component Value Date   CREATININE 0.88 01/17/2019  BUN 14 01/17/2019   NA 134 (L) 01/17/2019   K 3.9 01/17/2019   CL 100 01/17/2019   CO2 25 01/17/2019    Lab Results  Component Value Date   ALT 23 01/17/2019   AST 20 01/17/2019   ALKPHOS 265 02/09/2010   BILITOT 0.3 01/17/2019    Lab Results  Component Value Date   CHOL 131 01/17/2019   HDL 31 (L) 01/17/2019   LDLCALC 84 01/17/2019   TRIG 69 01/17/2019   CHOLHDL 4.2 01/17/2019   CD4 T Cell Abs (/uL)  Date Value  01/17/2019 360 (L)     Assessment & Plan:   Problem List Items Addressed This Visit      Unprioritized   HIV (human immunodeficiency virus infection) ([place]) - Primary (Chronic)    New patient here to establish for HIV care. I discussed with [name] treatment options/side effects, benefits of treatment and long-term outcomes. I discussed how HIV is transmitted and the process of untreated HIV including increased risk for opportunistic infections, cancer, dementia and renal failure. Patient was counseled on routine HIV care including medication adherence, blood monitoring, necessary vaccines and follow up visits. Counseled regarding safe sex practices including: condom use, partner disclosure, limiting partners. Patient spent time talking with our pharmacist Cassie regarding successful practices of ART and understands to reach out to our clinic in the future with questions.   Kelly Knight start [place] for HIV treatment given his  desire for crushable regimen. He is uninsured and Kelly Knight need referral to financial team to obtain HMAP/RW (he met with Timmothy Sours today.) Kelly Knight return to clinic in 4 weeks to review lab work and continue HIV education. Discussed interval for re-application today and services provided on  HMAP.   General introduction to our clinic and integrated services. Discussed counseling and THP services/resources today. Dental referral currently unable to be placed with current COVID-19 pandemic. No acute needs.   I spent greater than 45 minutes with the patient today. Greater than 50% of the time spent face-to-face counseling and coordination of care re: HIV and health maintenance.        Relevant Medications   dolutegravir (TIVICAY) 50 MG tablet   emtricitabine-tenofovir AF (DESCOVY) 200-25 MG tablet   Other Relevant Orders   HIV-1/2 AB - differentiation (Completed)   Syphilis    Latent syphilis of unknown duration. Untreated currently but with plans to go to [facility] this [facility] for treatment. He Kelly Knight need weekly IM injections of 2.4 million units bicillin x 3. Follow titers every 3-3m       Relevant Medications   dolutegravir (TIVICAY) 50 MG tablet   emtricitabine-tenofovir AF (DESCOVY) 200-25 MG tablet    Other Visit Diagnoses    High risk medication use       Screening examination for venereal disease       Relevant Orders   Rpr titer (Completed)   Fluorescent treponemal ab(fta)-IgG-bld (Completed)     Kelly Knight give prevnar at upcoming visit (declined today) and appropriate hepatitis vaccine boosters as indicated.   SJanene Madeira MSN, NP-C RGundersen St Josephs Hlth Svcsfor Infectious DCoburgPager: [phone]: [phone] 01/23/19  1:50 PM

## 2019-01-17 NOTE — Telephone Encounter (Signed)
[facility] Patient Advocate Encounter  Completed and sent [employer] Advancing Access application for Descovy for this patient who is uninsured.   Patient is approved for 30 days to allow for ADAP to be approved.  BIN [id number] PCN    [id number] GRP     [id number] ID       [id number]   Completed and sent [employer] patient assistance application for Tivicay for this patient who is uninsured. Patient is approved for 3- 30 day fills to allow for ADAP approval. BIN [id number] PCN    [id number] GRP    [id number] ID     [id number]  Kathie Rhodes E. Dimas Aguas CPhT Specialty Pharmacy Patient [facility] Phone: [phone] Fax:  [phone]

## 2019-01-17 NOTE — Patient Instructions (Addendum)
Very nice to meet you today!  The medications that I want start you on today are called Tivicay and Descovy.  The Tivicay is a small circle yellow pill and the Descovy is a small blue rectangle pill.  These both can be crushed and taken with applesauce as you need.  Please take them always together at the same time every day.  Side effects initially can include some mild headache (please take Tylenol to treat), loose bowel movements.  These typically resolve with 3 to 4 weeks of treatment.  You are more than welcome to bring your aunt with you to your next appointment with me in 2 months.  Please sign up for your my chart so you can see your lab work results from today.  Please come back in 4 weeks after you start your medication to meet with our pharmacy team so you can review your lab work and we will repeat your viral load.  Would like to give you your pneumonia vaccine at your return visit.

## 2019-01-18 LAB — URINALYSIS
Bilirubin Urine: NEGATIVE
Glucose, UA: NEGATIVE
Hgb urine dipstick: NEGATIVE
Ketones, ur: NEGATIVE
Nitrite: NEGATIVE
Protein, ur: NEGATIVE
Specific Gravity, Urine: 1.028 (ref 1.001–1.03)
pH: 7.5 (ref 5.0–8.0)

## 2019-01-18 LAB — T-HELPER CELL (CD4) - (RCID CLINIC ONLY)
CD4 % Helper T Cell: 10 % — ABNORMAL LOW (ref 33–55)
CD4 T Cell Abs: 360 /uL — ABNORMAL LOW (ref 400–2700)

## 2019-01-21 LAB — URINE CYTOLOGY ANCILLARY ONLY
Chlamydia: POSITIVE — AB
Neisseria Gonorrhea: POSITIVE — AB

## 2019-01-21 NOTE — Progress Notes (Signed)
Attempted to call Kelly Knight about his labs but not able to leave voice mail on his phone - his syphilis titer is extremely elevated. [facility] - I want to make sure he has received his first of 3 injections. If he has not would like to offer him treatment at [facility].   Please try to call him back later this afternoon please. I want to know if he is having any headaches, vision changes, vision loss, blurry vision, neck pain, ringing in the ears, feels off-balance. If the answers to this is yes Kelly Knight likely need to refer to ER for evaluation of neurosyphilis. I am happy to talk with him further should he call back - feel free to text/page me if he does.   I Kelly Knight send a MyChart to also get in touch with him.

## 2019-01-22 ENCOUNTER — Telehealth: Payer: Self-pay | Admitting: *Deleted

## 2019-01-22 NOTE — Telephone Encounter (Signed)
Per Kelly Knight called and left a message for him to call the office as soon as he is able to. Will inform of results once he calls back.

## 2019-01-22 NOTE — Telephone Encounter (Signed)
-----   Message from Blanchard Kelch, NP sent at 01/21/2019 12:55 PM EDT ----- Attempted to call Kelly Knight about his labs but not able to leave voice mail on his phone - his syphilis titer is extremely elevated. He was [facility] to [facility] with [facility] - I want to make sure he has received his first of 3 injections. If he has not would like to offer him treatment at [facility].   Please try to call him back later this afternoon please. I want to know if he is having any headaches, vision changes, vision loss, blurry vision, neck pain, ringing in the ears, feels off-balance. If the answers to this is yes Kelly Knight likely need to refer to ER for evaluation of neurosyphilis. I am happy to talk with him further should he call back - feel free to text/page me if he does.   I Kelly Knight send a MyChart to also get in touch with him.

## 2019-01-22 NOTE — Telephone Encounter (Signed)
If he is not quick to call back (I see he read my myChart message I sent him) would refer him back to [facility] to ensure he gets started on treatment.   Thank you for your help - he again did not answer my call this afternoon.

## 2019-01-23 ENCOUNTER — Encounter: Payer: Self-pay | Admitting: Infectious Diseases

## 2019-01-23 DIAGNOSIS — B20 Human immunodeficiency virus [HIV] disease: Secondary | ICD-10-CM

## 2019-01-23 DIAGNOSIS — A539 Syphilis, unspecified: Secondary | ICD-10-CM

## 2019-01-23 DIAGNOSIS — Z8619 Personal history of other infectious and parasitic diseases: Secondary | ICD-10-CM | POA: Insufficient documentation

## 2019-01-23 DIAGNOSIS — Z21 Asymptomatic human immunodeficiency virus [HIV] infection status: Secondary | ICD-10-CM

## 2019-01-23 HISTORY — DX: Syphilis, unspecified: A53.9

## 2019-01-23 HISTORY — DX: Asymptomatic human immunodeficiency virus (hiv) infection status: Z21

## 2019-01-23 HISTORY — DX: Human immunodeficiency virus (HIV) disease: B20

## 2019-01-23 NOTE — Assessment & Plan Note (Signed)
Latent syphilis of unknown duration. Untreated currently but with plans to go to [facility] this [facility] for treatment. He will need weekly IM injections of 2.4 million units bicillin x 3. Follow titers every 3-31m.

## 2019-01-23 NOTE — Assessment & Plan Note (Signed)
New patient here to establish for HIV care. I discussed with Kelly Knight treatment options/side effects, benefits of treatment and long-term outcomes. I discussed how HIV is transmitted and the process of untreated HIV including increased risk for opportunistic infections, cancer, dementia and renal failure. Patient was counseled on routine HIV care including medication adherence, blood monitoring, necessary vaccines and follow up visits. Counseled regarding safe sex practices including: condom use, partner disclosure, limiting partners. Patient spent time talking with our pharmacist Cassie regarding successful practices of ART and understands to reach out to our clinic in the future with questions.   Will start [place] for HIV treatment given his desire for crushable regimen. He is uninsured and will need referral to financial team to obtain HMAP/RW (he met with Timmothy Sours today.) Will return to clinic in 4 weeks to review lab work and continue HIV education. Discussed interval for re-application today and services provided on [place] HMAP.   General introduction to our clinic and integrated services. Discussed counseling and THP services/resources today. Dental referral currently unable to be placed with current COVID-19 pandemic. No acute needs.   I spent greater than 45 minutes with the patient today. Greater than 50% of the time spent face-to-face counseling and coordination of care re: HIV and health maintenance.

## 2019-01-23 NOTE — Telephone Encounter (Signed)
Information sent to [facility] for help linking to treatment for +RPR.

## 2019-01-28 ENCOUNTER — Telehealth: Payer: Self-pay | Admitting: Pharmacist

## 2019-01-28 NOTE — Telephone Encounter (Signed)
Patient is approved for [employer] Advancing Access from 01/17/2019 to 01/17/2020. The processing numbers for the pharmacy are:  ID: [id number] BIN: [id number] PCN: [id number] Group: [id number]

## 2019-01-31 LAB — COMPLETE METABOLIC PANEL WITH GFR
AG Ratio: 0.7 (calc) — ABNORMAL LOW (ref 1.0–2.5)
ALT: 23 U/L (ref 9–46)
AST: 20 U/L (ref 10–40)
Albumin: 3.9 g/dL (ref 3.6–5.1)
Alkaline phosphatase (APISO): 76 U/L (ref 36–130)
BUN: 14 mg/dL (ref 7–25)
CO2: 25 mmol/L (ref 20–32)
Calcium: 9.4 mg/dL (ref 8.6–10.3)
Chloride: 100 mmol/L (ref 98–110)
Creat: 0.88 mg/dL (ref 0.60–1.35)
GFR, Est African American: 140 mL/min/{1.73_m2} (ref >=60)
GFR, Est Non African American: 121 mL/min/{1.73_m2} (ref >=60)
Globulin: 5.5 g/dL (calc) — ABNORMAL HIGH (ref 1.9–3.7)
Glucose, Bld: 84 mg/dL (ref 65–99)
Potassium: 3.9 mmol/L (ref 3.5–5.3)
Sodium: 134 mmol/L — ABNORMAL LOW (ref 135–146)
Total Bilirubin: 0.3 mg/dL (ref 0.2–1.2)
Total Protein: 9.4 g/dL — ABNORMAL HIGH (ref 6.1–8.1)

## 2019-01-31 LAB — CBC WITH DIFFERENTIAL/PLATELET
Absolute Monocytes: 518 cells/uL (ref 200–950)
Basophils Absolute: 19 cells/uL (ref 0–200)
Basophils Relative: 0.3 %
Eosinophils Absolute: 237 cells/uL (ref 15–500)
Eosinophils Relative: 3.7 %
HCT: 39.8 % (ref 38.5–50.0)
Hemoglobin: 13.1 g/dL — ABNORMAL LOW (ref 13.2–17.1)
Lymphs Abs: 3622 cells/uL (ref 850–3900)
MCH: 27.3 pg (ref 27.0–33.0)
MCHC: 32.9 g/dL (ref 32.0–36.0)
MCV: 82.9 fL (ref 80.0–100.0)
MPV: 12.8 fL — ABNORMAL HIGH (ref 7.5–12.5)
Monocytes Relative: 8.1 %
Neutro Abs: 2003 cells/uL (ref 1500–7800)
Neutrophils Relative %: 31.3 %
Platelets: 235 10*3/uL (ref 140–400)
RBC: 4.8 10*6/uL (ref 4.20–5.80)
RDW: 14.1 % (ref 11.0–15.0)
Total Lymphocyte: 56.6 %
WBC: 6.4 10*3/uL (ref 3.8–10.8)

## 2019-01-31 LAB — HIV-1 RNA ULTRAQUANT REFLEX TO GENTYP+
HIV 1 RNA Quant: 223000 copies/mL — ABNORMAL HIGH
HIV-1 RNA Quant, Log: 5.35 Log copies/mL — ABNORMAL HIGH

## 2019-01-31 LAB — HIV ANTIBODY (ROUTINE TESTING W REFLEX): HIV 1&2 Ab, 4th Generation: REACTIVE — AB

## 2019-01-31 LAB — HEPATITIS C ANTIBODY
Hepatitis C Ab: NONREACTIVE
SIGNAL TO CUT-OFF: 0.18 (ref <1.00)

## 2019-01-31 LAB — HEPATITIS B SURFACE ANTIGEN: Hepatitis B Surface Ag: NONREACTIVE

## 2019-01-31 LAB — HEPATITIS A ANTIBODY, TOTAL: Hepatitis A AB,Total: REACTIVE — AB

## 2019-01-31 LAB — FLUORESCENT TREPONEMAL AB(FTA)-IGG-BLD: Fluorescent Treponemal ABS: REACTIVE — AB

## 2019-01-31 LAB — QUANTIFERON-TB GOLD PLUS
Mitogen-NIL: >10 IU/mL
NIL: 0.04 IU/mL
QuantiFERON-TB Gold Plus: NEGATIVE
TB1-NIL: 0.01 IU/mL
TB2-NIL: 0.01 IU/mL

## 2019-01-31 LAB — HEPATITIS B CORE ANTIBODY, TOTAL: Hep B Core Total Ab: NONREACTIVE

## 2019-01-31 LAB — HIV-1 GENOTYPE: HIV-1 Genotype: DETECTED — AB

## 2019-01-31 LAB — HLA B*5701: HLA-B*5701 w/rflx HLA-B High: NEGATIVE

## 2019-01-31 LAB — HIV-1/2 AB - DIFFERENTIATION
HIV-1 antibody: POSITIVE — AB
HIV-2 Ab: NEGATIVE

## 2019-01-31 LAB — LIPID PANEL
Cholesterol: 131 mg/dL (ref <200)
HDL: 31 mg/dL — ABNORMAL LOW (ref >=40)
LDL Cholesterol (Calc): 84 mg/dL (calc)
Non-HDL Cholesterol (Calc): 100 mg/dL (calc) (ref <130)
Total CHOL/HDL Ratio: 4.2 (calc) (ref <5.0)
Triglycerides: 69 mg/dL (ref <150)

## 2019-01-31 LAB — HEPATITIS B SURFACE ANTIBODY,QUALITATIVE: Hep B S Ab: NONREACTIVE

## 2019-01-31 LAB — RPR TITER: RPR Titer: 1:2048 {titer} — ABNORMAL HIGH

## 2019-01-31 LAB — RPR: RPR Ser Ql: REACTIVE — AB

## 2019-02-12 ENCOUNTER — Encounter: Payer: Self-pay | Admitting: Infectious Diseases

## 2019-02-15 MED FILL — DESCOVY 200-25 MG TABS: 200-25 | 30 days supply | Qty: 30 | Fill #1

## 2019-02-15 MED FILL — TIVICAY 50 MG TABLET: 50 | 30 days supply | Qty: 30 | Fill #1

## 2019-02-18 ENCOUNTER — Telehealth: Payer: Self-pay | Admitting: *Deleted

## 2019-02-18 NOTE — Telephone Encounter (Signed)
Melton Alar from [facility] called to follow up on patient's syphilis treatment. Per our records, he has not responded to [place], we referred him to [facility].  Per Melton Alar, patient keeps telling the health department that he is getting treatment here. She will follow up with him today. Please call Melton Alar (work cell [phone]) if patient is successfully treated at [facility]. She will let us know as well if they are able to reach the patient and successfully treat him.  Andree Coss, RN

## 2019-03-12 ENCOUNTER — Ambulatory Visit: Payer: Medicaid Other | Admitting: Infectious Diseases

## 2019-03-14 ENCOUNTER — Telehealth: Payer: Self-pay | Admitting: *Deleted

## 2019-03-14 ENCOUNTER — Ambulatory Visit: Payer: Medicaid Other | Admitting: Infectious Diseases

## 2019-03-14 NOTE — Telephone Encounter (Signed)
Notified Kelly Knight at [facility] that patient has no-showed twice to [facility].  She states he has not followed up with her for syphilis treatment. She is unable to do field visits right now, but will send him a letter.  She will refer him to [facility] and local health department for further (potential legal) action regarding untreated syphilis. Andree Coss, RN

## 2019-05-09 ENCOUNTER — Other Ambulatory Visit: Payer: Self-pay

## 2019-05-09 ENCOUNTER — Ambulatory Visit (INDEPENDENT_AMBULATORY_CARE_PROVIDER_SITE_OTHER): Payer: Self-pay | Admitting: Infectious Diseases

## 2019-05-09 ENCOUNTER — Encounter: Payer: Self-pay | Admitting: Infectious Diseases

## 2019-05-09 ENCOUNTER — Telehealth: Payer: Self-pay | Admitting: Infectious Diseases

## 2019-05-09 VITALS — BP 114/84 | HR 83 | Temp 98.7°F

## 2019-05-09 DIAGNOSIS — A539 Syphilis, unspecified: Secondary | ICD-10-CM

## 2019-05-09 DIAGNOSIS — Z23 Encounter for immunization: Secondary | ICD-10-CM

## 2019-05-09 DIAGNOSIS — Z113 Encounter for screening for infections with a predominantly sexual mode of transmission: Secondary | ICD-10-CM

## 2019-05-09 DIAGNOSIS — B20 Human immunodeficiency virus [HIV] disease: Secondary | ICD-10-CM

## 2019-05-09 DIAGNOSIS — Z202 Contact with and (suspected) exposure to infections with a predominantly sexual mode of transmission: Secondary | ICD-10-CM

## 2019-05-09 DIAGNOSIS — F329 Major depressive disorder, single episode, unspecified: Secondary | ICD-10-CM

## 2019-05-09 DIAGNOSIS — Z21 Asymptomatic human immunodeficiency virus [HIV] infection status: Secondary | ICD-10-CM

## 2019-05-09 MED ORDER — PENICILLIN G BENZATHINE 1200000 UNIT/2ML IM SUSP
1.2000 10*6.[IU] | Freq: Once | INTRAMUSCULAR | Status: AC
  Administered 2019-05-09: 17:00:00 1.2 10*6.[IU] via INTRAMUSCULAR

## 2019-05-09 MED ORDER — DIPHENOXYLATE-ATROPINE 2.5-0.025 MG PO TABS: 1.0000 | ORAL_TABLET | Freq: Four times a day (QID) | ORAL | 0 refills | Status: DC | PRN

## 2019-05-09 MED ORDER — PENICILLIN G BENZATHINE 1200000 UNIT/2ML IM SUSP
1.2000 10*6.[IU] | Freq: Once | INTRAMUSCULAR | Status: AC
  Administered 2019-05-09: 1.2 10*6.[IU] via INTRAMUSCULAR

## 2019-05-09 NOTE — Telephone Encounter (Signed)
COVID-19 Pre-Screening Questions: ° °Do you currently have a fever (>100 °F), chills or unexplained body aches? N ° °Are you currently experiencing new cough, shortness of breath, sore throat, runny nose? N °•  °Have you recently travelled outside the state of [place] in the last 14 days? N  °•  °Have you been in contact with someone that is currently pending confirmation of Covid19 testing or has been confirmed to have the Covid19 virus?  N ° °**If the patient answers NO to ALL questions -  advise the patient to please call the clinic before coming to the office should any symptoms develop.  ° ° ° °

## 2019-05-09 NOTE — Progress Notes (Signed)
Name: Kelly Knight  DOB: 1996-04-26 MRN: [id number] PCP: Patient, No Pcp Per    Patient Active Problem List   Diagnosis Date Noted  . Depression 05/10/2019  . HIV (human immunodeficiency virus infection) ([place]) 01/23/2019  . Syphilis 01/23/2019     Brief Narrative:  Kelly Knight is a 23 y.o. male with HIV disease, Dx 10/2018. CD4 nadir unknown.  HIV Risk: MSM History of OIs: none Intake Labs 01/2019: Hep B sAg (-) sAb (-)  cAb (-); hep A (+); hep c Ab (-) VL 223,000 CD4  RPR 1:2056 Quantiferon (-) HLA*B 5701 (-)   Previous Regimens: . Biktarvy   Genotypes: . 01/2019: K103N  Subjective:   Chief Complaint  Patient presents with  . Follow-up     HPI: [name] has not been seen since April of this year and has not responded to mulitple attempts to get in touch with him regarding his syphilis lab work. When I walked into his room he began crying and sobbing about his depression and worry over his condition stating that "it took everything inside of him to make an appointment today." He does have a boyfriend who he states is his support system. Other than that he has no one he relies on consistently. He states that he has lost multiple family members and had others "battling with COVID double pneumonia" recently and he cannot handle the stress. He has struggled with depression off and on since he was a teenager. He was hospitalized for suicide attempt x 2 years ago but has found counseling and talk therapy have helped. He desires this support again. He has not lost weight but is sleeping poorly. He does not wish to start antidepressants at this time. No plans for suicide only fleeting general thoughts about "what if I wasn't here." He states that his boyfriend is the reason he would never do anything to harm himself but he does think about it.   He states that he has been taking his Tivicay + Descovyevery day but has missed a few doses due to the stress. He still has  diarrhea every day (3-5 times) since starting. He still needs a crushable regimen to get the pills down, however. No associated cramping/pain, nausea or fevers.   He has never been treated for syphilis. No rashes, ulcers, vision changes. Does get some intermittent frontal headaches from time to time.   Depression screen PHQ 2/9 05/09/2019  Decreased Interest 1  Down, Depressed, Hopeless 1  PHQ - 2 Score 2  Altered sleeping 1  Tired, decreased energy 1  Change in appetite 0  Feeling bad or failure about yourself  1  Trouble concentrating 0  Moving slowly or fidgety/restless 0  Suicidal thoughts 1  PHQ-9 Score 6  Difficult doing work/chores Somewhat difficult    Review of Systems  Constitutional: Negative for chills, fever, malaise/fatigue and weight loss.  HENT: Negative for sore throat.   Respiratory: Negative for cough, sputum production and shortness of breath.   Cardiovascular: Negative.   Gastrointestinal: Negative for abdominal pain, diarrhea and vomiting.  Musculoskeletal: Negative for joint pain, myalgias and neck pain.  Skin: Negative for rash.  Neurological: Negative for headaches.  Psychiatric/Behavioral: Negative for depression and substance abuse. The patient is not nervous/anxious.     Past Medical History:  Diagnosis Date  . Asthma   . Heart murmur   . HIV (human immunodeficiency virus infection) ([place]) 01/23/2019  . Situs inversus   . Syphilis 01/23/2019  Outpatient Medications Prior to Visit  Medication Sig Dispense Refill  . acetaminophen (TYLENOL) 500 MG tablet Take 500 mg by mouth every 6 (six) hours as needed (pain).     Marland Kitchen albuterol (PROVENTIL HFA;VENTOLIN HFA) 108 (90 BASE) MCG/ACT inhaler Inhale 2 puffs into the lungs every 6 (six) hours as needed for wheezing.    Marland Kitchen albuterol (PROVENTIL) (2.5 MG/3ML) 0.083% nebulizer solution Take 2.5 mg by nebulization every 6 (six) hours as needed for wheezing.    . dolutegravir (TIVICAY) 50 MG tablet Take 1 tablet  (50 mg total) by mouth daily. 30 tablet 5  . emtricitabine-tenofovir AF (DESCOVY) 200-25 MG tablet Take 1 tablet by mouth daily. 30 tablet 5  . beclomethasone (QVAR) 80 MCG/ACT inhaler Inhale 2 puffs into the lungs 2 (two) times daily.    Marland Kitchen ibuprofen (ADVIL,MOTRIN) 600 MG tablet Take 1 tablet (600 mg total) by mouth every 6 (six) hours as needed. (Patient not taking: Reported on 05/09/2019) 30 tablet 0  . cyclobenzaprine (FLEXERIL) 10 MG tablet Take 1 tablet (10 mg total) by mouth 3 (three) times daily. (Patient not taking: Reported on 01/17/2019) 20 tablet 0  . doxycycline (VIBRAMYCIN) 100 MG capsule Take 1 capsule (100 mg total) by mouth 2 (two) times daily. For 10 days (Patient not taking: Reported on 01/17/2019) 20 capsule 0  . HYDROcodone-acetaminophen (NORCO/VICODIN) 5-325 MG per tablet Take one-two tabs po q 4-6 hrs prn pain (Patient not taking: Reported on 01/17/2019) 20 tablet 0   No facility-administered medications prior to visit.      Allergies  Allergen Reactions  . Shrimp [Shellfish Allergy]   . Sulfa Antibiotics Rash    Social History   Tobacco Use  . Smoking status: Never Smoker  . Smokeless tobacco: Never Used  Substance Use Topics  . Alcohol use: No    Comment: socially   . Drug use: No    Family History  Problem Relation Age of Onset  . Diabetes Mother   . Hypertension Mother   . Hypertension Father     Social History   Substance and Sexual Activity  Sexual Activity Not Currently  . Partners: Male     Objective:   Vitals:   05/09/19 1624  BP: 114/84  Pulse: 83  Temp: 98.7 F (37.1 C)  TempSrc: Oral   There is no height or weight on file to calculate BMI.  Physical Exam Constitutional:      Appearance: He is well-developed.     Comments: Seated comfortably in chair during visit.   HENT:     Mouth/Throat:     Mouth: Mucous membranes are moist.     Dentition: Normal dentition. No dental abscesses.     Pharynx: Oropharynx is clear. No  oropharyngeal exudate.  Eyes:     General: No scleral icterus.    Conjunctiva/sclera: Conjunctivae normal.     Pupils: Pupils are equal, round, and reactive to light.  Cardiovascular:     Rate and Rhythm: Normal rate and regular rhythm.     Heart sounds: Normal heart sounds.  Pulmonary:     Effort: Pulmonary effort is normal.     Breath sounds: Normal breath sounds.  Abdominal:     General: There is no distension.     Palpations: Abdomen is soft.     Tenderness: There is no abdominal tenderness.  Lymphadenopathy:     Cervical: No cervical adenopathy.  Skin:    General: Skin is warm and dry.     Capillary Refill:  Capillary refill takes less than 2 seconds.     Findings: No rash.  Neurological:     Mental Status: He is alert and oriented to person, place, and time.  Psychiatric:        Judgment: Judgment normal.     Comments: Crying and sobbing throughout visit. Very fearful about future of his health and inability to suppress HIV.      Lab Results Lab Results  Component Value Date   WBC 6.4 01/17/2019   HGB 13.1 (L) 01/17/2019   HCT 39.8 01/17/2019   MCV 82.9 01/17/2019   PLT 235 01/17/2019    Lab Results  Component Value Date   CREATININE 0.88 01/17/2019   BUN 14 01/17/2019   NA 134 (L) 01/17/2019   K 3.9 01/17/2019   CL 100 01/17/2019   CO2 25 01/17/2019    Lab Results  Component Value Date   ALT 23 01/17/2019   AST 20 01/17/2019   ALKPHOS 265 02/09/2010   BILITOT 0.3 01/17/2019    Lab Results  Component Value Date   CHOL 131 01/17/2019   HDL 31 (L) 01/17/2019   LDLCALC 84 01/17/2019   TRIG 69 01/17/2019   CHOLHDL 4.2 01/17/2019   HIV 1 RNA Quant (copies/mL)  Date Value  01/17/2019 223,000 (H)   CD4 T Cell Abs (/uL)  Date Value  05/09/2019 300 (L)  01/17/2019 360 (L)     Assessment & Plan:   Problem List Items Addressed This Visit      Unprioritized   HIV (human immunodeficiency virus infection) ([place]) (Chronic)    Will continue tivicay +  descovy so he can crush/split as needed. He seems to be taking it correctly. Will check VL and CD4 today. Diarrhea likely related to medications. Will check rectal STI's today and give trial of Lomotil since OTC imodium does not seem to work.  No signs of OI on exam today.  Prevnar vaccine today.  RTC in 6-8 weeks or sooner if needed.  He will schedule with Marcie Bal for support for depression exacerbation during adjustment period.       Syphilis    He had a very high RPR titer in April (1:2056). We attempted to get him in for a LP given uncontrolled HIV with high viral load and concern for possible asymptomatic neurosyphilis infection. He does not want to do this today. He is having some headaches intermittently that resolve on their own and now his exacerbated depression. Will go ahead and treat for latent syphilis of unknown duration with 3 weekly injections of Bicillin. Draw RPR titer today. If less will follow post treatment in 2-3 months to see if it is coming down. If his titer remains persistently elevated or incomplete treatment response will arrange LP.       Depression    Acute exacerbation of chronic depressed mood in the setting of prolonged adjustment period related to his HIV diagnosis. He has a lot of fear surrounding his diagnosis and fears he will never get undetectable. Unable to complete warm hand off with Marcie Bal today as she is with another patient but will have him schedule to see her next week. He does not want to consider any SSRI at this moment but will consider if counseling alone is not enough.  He is not suicidal.  Precautions and resources provided regarding emergency help should this be needed.        Other Visit Diagnoses    Routine screening for STI (sexually transmitted infection)    -  Primary   Relevant Orders   RPR (Completed)   Urine cytology ancillary only   Cytology (oral, anal, urethral) ancillary only   HIV disease ([place])       Relevant Orders   HIV-1 RNA  quant-no reflex-bld   T-helper cell (CD4)- ([facility] clinic only) (Completed)   Need for vaccination with 13-polyvalent pneumococcal conjugate vaccine       Relevant Orders   Pneumococcal conjugate vaccine 13-valent IM (Completed)   Syphilis contact, untreated       Relevant Medications   penicillin g benzathine (BICILLIN LA) 1200000 UNIT/2ML injection 1.2 Million Units (Completed)   penicillin g benzathine (BICILLIN LA) 1200000 UNIT/2ML injection 1.2 Million Units (Completed)      Janene Madeira, MSN, NP-C [facility] for Infectious [place] Pager: [phone] Office: [phone]  05/10/19  4:20 PM

## 2019-05-09 NOTE — Patient Instructions (Signed)
Will give you a shot for your syphilis treatment today. We may need to get you set up for a lumbar puncture (spine fluid draw) to make certain you don't have any syphilis infection causing your headaches. Treatment if this is the case is a little different.   Please continue taking your tivicay and descovy for now.   I will send in a new diarrhea medication that is on the [facility] pharmacy list.   Please come back next week for your second injection to treat your syphilis and the week after for your third.   I would like to see you back in the office in 2 months but here for you sooner if you need me.   Please schedule a follow up appointment with Marcie Bal to meet with her for counseling services. I think this will be very helpful for you.

## 2019-05-10 ENCOUNTER — Encounter: Payer: Self-pay | Admitting: Infectious Diseases

## 2019-05-10 DIAGNOSIS — F329 Major depressive disorder, single episode, unspecified: Secondary | ICD-10-CM | POA: Insufficient documentation

## 2019-05-10 DIAGNOSIS — F32A Depression, unspecified: Secondary | ICD-10-CM | POA: Insufficient documentation

## 2019-05-10 LAB — T-HELPER CELL (CD4) - (RCID CLINIC ONLY)
CD4 % Helper T Cell: 9 % — ABNORMAL LOW (ref 33–65)
CD4 T Cell Abs: 300 /uL — ABNORMAL LOW (ref 400–1790)

## 2019-05-10 NOTE — Assessment & Plan Note (Signed)
He had a very high RPR titer in April (1:2056). We attempted to get him in for a LP given uncontrolled HIV with high viral load and concern for possible asymptomatic neurosyphilis infection. He does not want to do this today. He is having some headaches intermittently that resolve on their own and now his exacerbated depression. Will go ahead and treat for latent syphilis of unknown duration with 3 weekly injections of Bicillin. Draw RPR titer today. If less will follow post treatment in 2-3 months to see if it is coming down. If his titer remains persistently elevated or incomplete treatment response will arrange LP.

## 2019-05-10 NOTE — Assessment & Plan Note (Signed)
Acute exacerbation of chronic depressed mood in the setting of prolonged adjustment period related to his HIV diagnosis. He has a lot of fear surrounding his diagnosis and fears he will never get undetectable. Unable to complete warm hand off with Marcie Bal today as she is with another patient but will have him schedule to see her next week. He does not want to consider any SSRI at this moment but will consider if counseling alone is not enough.  He is not suicidal.  Precautions and resources provided regarding emergency help should this be needed.

## 2019-05-10 NOTE — Assessment & Plan Note (Addendum)
Will continue tivicay + descovy so he can crush/split as needed. He seems to be taking it correctly. Will check VL and CD4 today. Diarrhea likely related to medications. Will check rectal STI's today and give trial of Lomotil since OTC imodium does not seem to work.  No signs of OI on exam today.  Prevnar vaccine today.  RTC in 6-8 weeks or sooner if needed.  He will schedule with Marcie Bal for support for depression exacerbation during adjustment period.

## 2019-05-13 ENCOUNTER — Encounter: Payer: Self-pay | Admitting: Infectious Diseases

## 2019-05-16 ENCOUNTER — Other Ambulatory Visit: Payer: Self-pay

## 2019-05-16 ENCOUNTER — Ambulatory Visit (INDEPENDENT_AMBULATORY_CARE_PROVIDER_SITE_OTHER): Payer: Self-pay

## 2019-05-16 ENCOUNTER — Telehealth: Payer: Self-pay

## 2019-05-16 DIAGNOSIS — A539 Syphilis, unspecified: Secondary | ICD-10-CM

## 2019-05-16 DIAGNOSIS — B20 Human immunodeficiency virus [HIV] disease: Secondary | ICD-10-CM

## 2019-05-16 LAB — CYTOLOGY, (ORAL, ANAL, URETHRAL) ANCILLARY ONLY
Chlamydia: POSITIVE — AB
Neisseria Gonorrhea: POSITIVE — AB

## 2019-05-16 LAB — URINE CYTOLOGY ANCILLARY ONLY
Chlamydia: POSITIVE — AB
Neisseria Gonorrhea: POSITIVE — AB

## 2019-05-16 MED ORDER — PENICILLIN G BENZATHINE 1200000 UNIT/2ML IM SUSP
1.2000 10*6.[IU] | Freq: Once | INTRAMUSCULAR | Status: AC
  Administered 2019-05-16: 1.2 10*6.[IU] via INTRAMUSCULAR

## 2019-05-16 NOTE — Telephone Encounter (Signed)
Patient made aware of new appointment for final treatment via [place], LPN

## 2019-05-16 NOTE — Telephone Encounter (Signed)
-----   Message from [place] Callas, NP sent at 05/16/2019  2:50 PM EDT ----- Regarding: RE: syphilis treatment Yes that is OK - glad he will complete the series!   Been after him for months..... ----- Message ----- From: Eugenia Mcalpine, LPN Sent: 11/14/4268   2:47 PM EDT To: [place] Callas, NP Subject: syphilis treatment                             Patient asks if he could come one day early next week for final weekly bicillin injection (Wed instead of Thurs. )  Are you ok with this? ~Masyn Rostro

## 2019-05-16 NOTE — Telephone Encounter (Signed)
-----   Message from [place] Callas, NP sent at 05/16/2019  3:22 PM EDT ----- Patient is positive on urine and rectal swab for gonorrhea and chlamydia. Patient will also need treatment at his upcoming appointment for last bicillin injection with 250 mg IM Ceftriaxone x 1 with Azithromycin 1 gm PO. Would have him have something to eat 30 minutes before taking medication to help with nausea or can bring something with him to clinic.   His sexual partners/boyfriend needs to be notified and treated at HD. Would abstain from all forms of sexual encounters until he has completed treatment for this.

## 2019-05-16 NOTE — Progress Notes (Signed)
Patient is positive on urine and rectal swab for gonorrhea and chlamydia. Patient will also need treatment at his upcoming appointment for last bicillin injection with 250 mg IM Ceftriaxone x 1 with Azithromycin 1 gm PO. Would have him have something to eat 30 minutes before taking medication to help with nausea or can bring something with him to clinic.   His sexual partners/boyfriend needs to be notified and treated at HD. Would abstain from all forms of sexual encounters until he has completed treatment for this.

## 2019-05-16 NOTE — Progress Notes (Signed)
Bicillin 2.4 IM weekly dose #2 of 3 administered today. Patient offered/accepted condoms. Tolerated injections well.  Patient would like to schedule last weekly injection one day early. LPN will verify with provider and make patient aware via [place].  Patient provided with work note.  Eugenia Mcalpine, LPN

## 2019-05-17 LAB — RPR TITER: RPR Titer: 1:128 {titer} — ABNORMAL HIGH

## 2019-05-17 LAB — HIV-1 RNA QUANT-NO REFLEX-BLD
HIV 1 RNA Quant: 307000 copies/mL — ABNORMAL HIGH
HIV-1 RNA Quant, Log: 5.49 Log copies/mL — ABNORMAL HIGH

## 2019-05-17 LAB — FLUORESCENT TREPONEMAL AB(FTA)-IGG-BLD: Fluorescent Treponemal ABS: REACTIVE — AB

## 2019-05-17 LAB — RPR: RPR Ser Ql: REACTIVE — AB

## 2019-05-20 NOTE — Telephone Encounter (Signed)
Excellent thank you

## 2019-05-20 NOTE — Telephone Encounter (Signed)
Patient is scheduled to come for bicillin #3/3 on 8/12.  Added note about ceftriaxone/azithromycin to the appointment information.

## 2019-05-22 ENCOUNTER — Ambulatory Visit: Payer: Medicaid Other

## 2019-05-22 ENCOUNTER — Telehealth: Payer: Self-pay

## 2019-05-22 NOTE — Telephone Encounter (Signed)
Attempted to call patient to reschedule missed appointment. Left voicemail to return call to [facility].  Eugenia Mcalpine, LPN

## 2019-05-22 NOTE — Telephone Encounter (Signed)
I see he may have rescheduled for tomorrow 8/13 but now that appointment is cancelled.  I reached out to Kelly Knight via his MyChart requesting him to call back. If he waits > 10 days between injections he Kelly Knight need to restart them, unfortunately.

## 2019-05-23 ENCOUNTER — Ambulatory Visit: Payer: Medicaid Other

## 2019-05-30 ENCOUNTER — Encounter: Payer: Self-pay | Admitting: Infectious Diseases

## 2019-05-30 ENCOUNTER — Ambulatory Visit (INDEPENDENT_AMBULATORY_CARE_PROVIDER_SITE_OTHER): Payer: Self-pay | Admitting: Infectious Diseases

## 2019-05-30 ENCOUNTER — Ambulatory Visit: Payer: Self-pay

## 2019-05-30 ENCOUNTER — Other Ambulatory Visit: Payer: Self-pay

## 2019-05-30 ENCOUNTER — Ambulatory Visit: Payer: Medicaid Other

## 2019-05-30 DIAGNOSIS — F329 Major depressive disorder, single episode, unspecified: Secondary | ICD-10-CM

## 2019-05-30 DIAGNOSIS — Z21 Asymptomatic human immunodeficiency virus [HIV] infection status: Secondary | ICD-10-CM

## 2019-05-30 DIAGNOSIS — A549 Gonococcal infection, unspecified: Secondary | ICD-10-CM

## 2019-05-30 DIAGNOSIS — A749 Chlamydial infection, unspecified: Secondary | ICD-10-CM

## 2019-05-30 DIAGNOSIS — A539 Syphilis, unspecified: Secondary | ICD-10-CM

## 2019-05-30 MED ORDER — PENICILLIN G BENZATHINE 1200000 UNIT/2ML IM SUSP
1.2000 10*6.[IU] | Freq: Once | INTRAMUSCULAR | Status: AC
  Administered 2019-05-30: 11:00:00 1.2 10*6.[IU] via INTRAMUSCULAR

## 2019-05-30 MED ORDER — DOXYCYCLINE MONOHYDRATE 100 MG PO CAPS: 100.0000 mg | ORAL_CAPSULE | Freq: Two times a day (BID) | ORAL | 0 refills | Status: AC

## 2019-05-30 MED ORDER — CEFTRIAXONE SODIUM 250 MG IJ SOLR
250.0000 mg | Freq: Once | INTRAMUSCULAR | Status: AC
  Administered 2019-05-30: 11:00:00 250 mg via INTRAMUSCULAR

## 2019-05-30 NOTE — Assessment & Plan Note (Signed)
Continue to meet with Marcie Bal for counseling. He does not wish to consider antidepressants at this time.

## 2019-05-30 NOTE — Assessment & Plan Note (Signed)
He has been doing well getting his Tivicay + Descovy in every day with pill crusher. He will see me back in a few weeks as planned to repeat his VL.

## 2019-05-30 NOTE — Assessment & Plan Note (Signed)
Covered with 28-days doxycycline PO.

## 2019-05-30 NOTE — Assessment & Plan Note (Addendum)
250 mg IM ceftriaxone today x 1 with doxycycline p.o. Counseled on prevention for futures STIs.

## 2019-05-30 NOTE — Assessment & Plan Note (Signed)
Unfortunately lapse between 2nd and 3rd injection today warrants restarting. We discussed this and given his difficulty with appts will give 28-day course of doxycycline. Follow up RPR in 3 months

## 2019-05-30 NOTE — Patient Instructions (Signed)
For your syphilis - will do last shot today and have you take doxycycline twice a day with food for 28 days. This will also treat chlamydia infection.   I am so pleased you met with Marcie Bal and I look forward to seeing you soon!

## 2019-05-30 NOTE — Progress Notes (Signed)
Name: Kelly Knight  DOB: [birth year] MRN: [id number] PCP: Patient, No Pcp Per    Patient Active Problem List   Diagnosis Date Noted  . Chlamydia 05/30/2019  . Gonorrhea 05/30/2019  . Depression 05/10/2019  . HIV (human immunodeficiency virus infection) ([place]) 01/23/2019  . Syphilis 01/23/2019     Brief Narrative:  Kelly Knight is a 23 y.o. male with HIV disease, Dx 10/2018. CD4 nadir unknown.  HIV Risk: MSM History of OIs: none Intake Labs 01/2019: Hep B sAg (-) sAb (-)  cAb (-); hep A (+); hep c Ab (-) VL 223,000 CD4  RPR 1:2056 --> 1:128 Quantiferon (-) HLA*B 5701 (-)   Previous Regimens: . Tivicay + Descovy (needs crushable regimen)   Genotypes: . 01/2019: K103N   Subjective:  CC:  HIV follow up care and STD treatment.    HPI: Kelly Knight is here today meeting with our clinic counselor Marcie Bal and had a very good introductory conversation to engage in counseling services. He unfortunately lost his grandmother recently and he was very close to her. He has continued to take his tivicay and descovy every day crushed.   He has has 2 shots for syphilis treatment and is here for his third today; however it has been > 14 days since his second shot.    Review of Systems  Constitutional: Negative for chills, fever, malaise/fatigue and weight loss.  HENT: Negative for sore throat.   Respiratory: Negative for cough, sputum production and shortness of breath.   Cardiovascular: Negative.   Gastrointestinal: Negative for abdominal pain, diarrhea and vomiting.  Musculoskeletal: Negative for joint pain, myalgias and neck pain.  Skin: Negative for rash.  Neurological: Negative for headaches.  Psychiatric/Behavioral: Negative for depression and substance abuse. The patient is not nervous/anxious.     Past Medical History:  Diagnosis Date  . Asthma   . Heart murmur   . HIV (human immunodeficiency virus infection) ([place]) 01/23/2019  . Situs inversus   . Syphilis 01/23/2019     Outpatient Medications Prior to Visit  Medication Sig Dispense Refill  . acetaminophen (TYLENOL) 500 MG tablet Take 500 mg by mouth every 6 (six) hours as needed (pain).     Marland Kitchen albuterol (PROVENTIL HFA;VENTOLIN HFA) 108 (90 BASE) MCG/ACT inhaler Inhale 2 puffs into the lungs every 6 (six) hours as needed for wheezing.    Marland Kitchen albuterol (PROVENTIL) (2.5 MG/3ML) 0.083% nebulizer solution Take 2.5 mg by nebulization every 6 (six) hours as needed for wheezing.    . beclomethasone (QVAR) 80 MCG/ACT inhaler Inhale 2 puffs into the lungs 2 (two) times daily.    . diphenoxylate-atropine (LOMOTIL) 2.5-0.025 MG tablet Take 1 tablet by mouth 4 (four) times daily as needed for diarrhea or loose stools. 30 tablet 0  . dolutegravir (TIVICAY) 50 MG tablet Take 1 tablet (50 mg total) by mouth daily. 30 tablet 5  . emtricitabine-tenofovir AF (DESCOVY) 200-25 MG tablet Take 1 tablet by mouth daily. 30 tablet 5  . ibuprofen (ADVIL,MOTRIN) 600 MG tablet Take 1 tablet (600 mg total) by mouth every 6 (six) hours as needed. (Patient not taking: Reported on 05/09/2019) 30 tablet 0   No facility-administered medications prior to visit.      Allergies  Allergen Reactions  . Shrimp [Shellfish Allergy]   . Sulfa Antibiotics Rash    Social History   Tobacco Use  . Smoking status: Never Smoker  . Smokeless tobacco: Never Used  Substance Use Topics  . Alcohol use: No  Comment: socially   . Drug use: No    Social History   Substance and Sexual Activity  Sexual Activity Not Currently  . Partners: Male     Objective:   There were no vitals filed for this visit. There is no height or weight on file to calculate BMI.  Physical Exam Neurological:     Mental Status: He is alert and oriented to person, place, and time.  Psychiatric:        Mood and Affect: Mood normal.        Thought Content: Thought content normal.        Judgment: Judgment normal.     Lab Results Lab Results  Component Value  Date   WBC 6.4 01/17/2019   HGB 13.1 (L) 01/17/2019   HCT 39.8 01/17/2019   MCV 82.9 01/17/2019   PLT 235 01/17/2019    Lab Results  Component Value Date   CREATININE 0.88 01/17/2019   BUN 14 01/17/2019   NA 134 (L) 01/17/2019   K 3.9 01/17/2019   CL 100 01/17/2019   CO2 25 01/17/2019    Lab Results  Component Value Date   ALT 23 01/17/2019   AST 20 01/17/2019   ALKPHOS 265 02/09/2010   BILITOT 0.3 01/17/2019    Lab Results  Component Value Date   CHOL 131 01/17/2019   HDL 31 (L) 01/17/2019   LDLCALC 84 01/17/2019   TRIG 69 01/17/2019   CHOLHDL 4.2 01/17/2019   HIV 1 RNA Quant (copies/mL)  Date Value  05/09/2019 307,000 (H)  01/17/2019 223,000 (H)   CD4 T Cell Abs (/uL)  Date Value  05/09/2019 300 (L)  01/17/2019 360 (L)     Assessment & Plan:   Problem List Items Addressed This Visit      Unprioritized   HIV (human immunodeficiency virus infection) ([place]) - Primary (Chronic)    He has been doing well getting his Tivicay + Descovy in every day with pill crusher. He Kelly Knight see me back in a few weeks as planned to repeat his VL.       Syphilis    Unfortunately lapse between 2nd and 3rd injection today warrants restarting. We discussed this and given his difficulty with appts Kelly Knight give 28-day course of doxycycline. Follow up RPR in 3 months       Relevant Medications   doxycycline (MONODOX) 100 MG capsule   Depression    Continue to meet with Marcie Bal for counseling. He does not wish to consider antidepressants at this time.       Chlamydia    Covered with 28-days doxycycline PO.       Gonorrhea    250 mg IM ceftriaxone today x 1 with doxycycline p.o. Counseled on prevention for futures STIs.       Relevant Medications   doxycycline (MONODOX) 100 MG capsule      Janene Madeira, MSN, NP-C [facility] for Infectious Disease [email or web address] Pager: [phone] Office: [phone] [place]:  [phone]   05/30/19  1:12 PM

## 2019-06-06 ENCOUNTER — Ambulatory Visit: Payer: Medicaid Other

## 2019-07-04 ENCOUNTER — Ambulatory Visit: Payer: Medicaid Other | Admitting: Infectious Diseases

## 2019-10-17 ENCOUNTER — Ambulatory Visit: Payer: Self-pay

## 2019-10-17 ENCOUNTER — Other Ambulatory Visit: Payer: Self-pay

## 2019-11-21 ENCOUNTER — Other Ambulatory Visit: Payer: Self-pay

## 2019-11-21 ENCOUNTER — Ambulatory Visit: Payer: Self-pay

## 2019-11-21 ENCOUNTER — Encounter: Payer: Self-pay | Admitting: Infectious Diseases

## 2019-11-21 ENCOUNTER — Ambulatory Visit (INDEPENDENT_AMBULATORY_CARE_PROVIDER_SITE_OTHER): Payer: Self-pay | Admitting: Infectious Diseases

## 2019-11-21 ENCOUNTER — Ambulatory Visit: Payer: Medicaid Other

## 2019-11-21 VITALS — BP 126/75 | HR 86 | Temp 97.6°F | Ht 69.0 in | Wt 164.0 lb

## 2019-11-21 DIAGNOSIS — F329 Major depressive disorder, single episode, unspecified: Secondary | ICD-10-CM

## 2019-11-21 DIAGNOSIS — Z8619 Personal history of other infectious and parasitic diseases: Secondary | ICD-10-CM

## 2019-11-21 DIAGNOSIS — B20 Human immunodeficiency virus [HIV] disease: Secondary | ICD-10-CM

## 2019-11-21 DIAGNOSIS — A539 Syphilis, unspecified: Secondary | ICD-10-CM

## 2019-11-21 DIAGNOSIS — Z23 Encounter for immunization: Secondary | ICD-10-CM

## 2019-11-21 MED ORDER — TIVICAY 50 MG PO TABS: 50.0000 mg | ORAL_TABLET | Freq: Every day | ORAL | 5 refills | Status: DC

## 2019-11-21 MED ORDER — EMTRICITABINE-TENOFOVIR AF 200-25 MG PO TABS: 1.0000 | ORAL_TABLET | Freq: Every day | ORAL | 5 refills | Status: DC

## 2019-11-21 MED ORDER — ALBUTEROL SULFATE HFA 108 (90 BASE) MCG/ACT IN AERS: 2.0000 | INHALATION_SPRAY | Freq: Four times a day (QID) | RESPIRATORY_TRACT | 3 refills | Status: DC | PRN

## 2019-11-21 MED ORDER — BECLOMETHASONE DIPROPIONATE 80 MCG/ACT IN AERS: 2.0000 | INHALATION_SPRAY | Freq: Two times a day (BID) | RESPIRATORY_TRACT | 3 refills | Status: DC

## 2019-11-21 NOTE — Patient Instructions (Addendum)
Nice to see you today!  Will get you back on your Tivicay and Descovy once a day  Please stop by the lab today   We gave you your flu shot today  Will refill your inhalers - use the GoodRx card to help with paying for them  Please schedule an appointment with Marylu Lund as soon as you can.   Please come back to see Judeth Cornfield in 4 weeks - tell them 30 minute appointment when you schedule.

## 2019-11-21 NOTE — Assessment & Plan Note (Signed)
S/P incomplete administration of Bicillin in 2020; completed 28d doxycycline as prescribed.  No active symptoms today. Will repeat titer. If still elevated would recommend LP to evaluate for asymptomatic neurosyphilis given his very elevated initial titer of 1:256.
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
## 2019-11-21 NOTE — Assessment & Plan Note (Addendum)
Discussed options regarding getting back on treatment. Offered to have him hold off another month to work on mental health but he feels that he is ready and it would cause more stress if we wait longer. Will check genotype today and resume tivicay and descovy.  He is very interested in injectable option once we have this available at clinic. Will need to make sure he will remain local and meets appointments to consider this option for him.  Will refer to [facility] today for case management assistance given he may be homeless (staying currently with his best friend).   No OIs on exam - will check CD4 as well to determine if he needs prophylaxis.

## 2019-11-21 NOTE — Assessment & Plan Note (Signed)
Active and uncontrolled. We spent a lot of time during our visit today breaking down problems/concerns and trying to prioritize and plan for forward motion. He is not at risk for harming himself and future oriented today. Will refer him back to counseling services with Marylu Lund and have him back to see me in a month.   May need medications to help him get through this - politely declined for now but will re-evaluate at upcoming visit.

## 2019-11-22 ENCOUNTER — Encounter: Payer: Self-pay | Admitting: Infectious Diseases

## 2019-11-22 LAB — T-HELPER CELL (CD4) - (RCID CLINIC ONLY)
CD4 % Helper T Cell: 10 % — ABNORMAL LOW (ref 33–65)
CD4 T Cell Abs: 220 /uL — ABNORMAL LOW (ref 400–1790)

## 2019-11-27 ENCOUNTER — Other Ambulatory Visit: Payer: Self-pay | Admitting: Pharmacist

## 2019-11-27 DIAGNOSIS — J45909 Unspecified asthma, uncomplicated: Secondary | ICD-10-CM

## 2019-11-27 MED ORDER — FLOVENT HFA 44 MCG/ACT IN AERO: 2.0000 | INHALATION_SPRAY | Freq: Two times a day (BID) | RESPIRATORY_TRACT | 12 refills | Status: DC

## 2019-11-27 NOTE — Progress Notes (Addendum)
Qvar not on Medicaid formulary. Will send in Flovent HFA instead.

## 2019-12-04 NOTE — Progress Notes (Signed)
RPR has not dropped by at least 4 fold. He had an extremely high titer at onset and delay for treatment. Will discuss with him re: need for LP to investigate asymptomatic neurosyphilis.

## 2019-12-05 ENCOUNTER — Ambulatory Visit: Payer: Self-pay

## 2019-12-05 ENCOUNTER — Other Ambulatory Visit: Payer: Self-pay

## 2019-12-09 LAB — HIV-1 INTEGRASE GENOTYPE

## 2019-12-09 LAB — HIV RNA, RTPCR W/R GT (RTI, PI,INT)
HIV 1 RNA Quant: 220000 copies/mL — ABNORMAL HIGH
HIV-1 RNA Quant, Log: 5.34 Log copies/mL — ABNORMAL HIGH

## 2019-12-09 LAB — HIV-1 GENOTYPE: HIV-1 Genotype: DETECTED — AB

## 2019-12-09 LAB — FLUORESCENT TREPONEMAL AB(FTA)-IGG-BLD: Fluorescent Treponemal ABS: REACTIVE — AB

## 2019-12-09 LAB — RPR TITER: RPR Titer: 1:64 {titer} — ABNORMAL HIGH

## 2019-12-09 LAB — RPR: RPR Ser Ql: REACTIVE — AB

## 2019-12-18 ENCOUNTER — Telehealth: Payer: Self-pay

## 2019-12-18 NOTE — Telephone Encounter (Signed)
COVID-19 Pre-Screening Questions:12/18/19  Do you currently have a fever (>100 F), chills or unexplained body aches?NO   Are you currently experiencing new cough, shortness of breath, sore throat, runny nose? NO  .  Have you recently travelled outside the state of [place] in the last 14 days?NO .  Have you been in contact with someone that is currently pending confirmation of Covid19 testing or has been confirmed to have the Covid19 virus?  NO   **If the patient answers NO to ALL questions -  advise the patient to please call the clinic before coming to the office should any symptoms develop.     

## 2019-12-19 ENCOUNTER — Other Ambulatory Visit: Payer: Self-pay

## 2019-12-19 ENCOUNTER — Ambulatory Visit (INDEPENDENT_AMBULATORY_CARE_PROVIDER_SITE_OTHER): Payer: Self-pay | Admitting: Infectious Diseases

## 2019-12-19 ENCOUNTER — Encounter: Payer: Self-pay | Admitting: Infectious Diseases

## 2019-12-19 ENCOUNTER — Ambulatory Visit: Payer: Medicaid Other

## 2019-12-19 VITALS — BP 126/80 | HR 84 | Temp 97.9°F | Ht 69.0 in | Wt 165.0 lb

## 2019-12-19 DIAGNOSIS — Z8619 Personal history of other infectious and parasitic diseases: Secondary | ICD-10-CM

## 2019-12-19 DIAGNOSIS — F329 Major depressive disorder, single episode, unspecified: Secondary | ICD-10-CM

## 2019-12-19 DIAGNOSIS — F419 Anxiety disorder, unspecified: Secondary | ICD-10-CM

## 2019-12-19 DIAGNOSIS — Z21 Asymptomatic human immunodeficiency virus [HIV] infection status: Secondary | ICD-10-CM

## 2019-12-19 DIAGNOSIS — F32A Depression, unspecified: Secondary | ICD-10-CM

## 2019-12-19 NOTE — Assessment & Plan Note (Signed)
Seems that he is doing very well with resuming his Tivicay and Descovy. He will continue this everyday. Will have him sign for consideration of Cabenuva today so we can start that process for him.   I would like for him to stay on the current regimen for another 2 months prior to transitioning to oral lead in with Cabotegravir/Rilpivirine x 28d so we can keep viral load suppressed.  RTC in 2 months.

## 2019-12-19 NOTE — Assessment & Plan Note (Addendum)
Initial titer very high in 01-2019 1:2056. Received 3 injections of Bicillin 05/09/19, 05/16/19, 05/30/19. Titer follow up 1:128 with appropriate drop 4-fold.  Titer now 1:64 - still elevated but may still be dropping and finding nadir. Will follow up again in 3 months. May need to consider LP to evaluate for asymptomatic neurosyphilis if titer remains 1:64; discussed with Will today and understands the plan.

## 2019-12-19 NOTE — Patient Instructions (Signed)
Nice to see you!  Please stop by the lab today before you leave to repeat your viral load.   I would also like for you to meet with our pharmacy team to sign consent for the injectable treatment Cabenuva   I would like to see you back in 2 months to check in with you.

## 2019-12-19 NOTE — Progress Notes (Signed)
Name: Kelly Knight  DOB: 1996-05-03 MRN: [id number] PCP: Patient, No Pcp Per    Patient Active Problem List   Diagnosis Date Noted  . HIV (human immunodeficiency virus infection) ([place]) 01/23/2019    Priority: High  . Anxiety and depression 05/10/2019  . History of syphilis 01/23/2019     Brief Narrative:  Kelly Knight is a 24 y.o. male with HIV disease, Dx 10/2018. CD4 nadir unknown.  HIV Risk: MSM History of OIs: none Intake Labs 01/2019: Hep B sAg (-) sAb (-)  cAb (-); hep A (+); hep c Ab (-) VL 223,000 CD4  RPR 1:2056 --> 1:128 --> 1:64  Quantiferon (-) HLA*B 5701 (-)   Previous Regimens: . Tivicay + Descovy (needs crushable regimen)   Genotypes: . 01/2019: K103N   Subjective:  CC:  HIV follow up care  Depression follow up    HPI: Kelly Knight has been doing well since our last office visit. Has resumed his Tivicay + Descovy every day without missed dose. He crushes the pills and puts them in his yogurt. He has had no side effects to his medications since resuming 4 weeks ago.   He is excited because he is now working a part time job at [employer] and loves it. Gets him out of the house and is good for his soul. He has also re-kindled conversations with his mother. Really trying to focus on working on moving forward with regards to his depression/anxiety. He has a meeting with Marcie Bal today which he is very much looking forward to. He describes his mood in general lately as happy. Sleeping well and eating well.    Review of Systems  Constitutional: Negative for appetite change, chills, fatigue, fever and unexpected weight change.  HENT: Negative for sore throat.   Eyes: Negative for visual disturbance.  Respiratory: Negative for cough, chest tightness, shortness of breath and wheezing.   Cardiovascular: Negative for chest pain and leg swelling.  Gastrointestinal: Negative for abdominal pain, diarrhea and nausea.  Genitourinary: Negative for discharge, dysuria  and genital sores.  Musculoskeletal: Negative for joint swelling.  Skin: Negative for color change and rash.  Neurological: Negative for dizziness and headaches.  Hematological: Negative for adenopathy.  Psychiatric/Behavioral: Positive for dysphoric mood. Negative for sleep disturbance. The patient is nervous/anxious.      Past Medical History:  Diagnosis Date  . Asthma   . Heart murmur   . HIV (human immunodeficiency virus infection) ([place]) 01/23/2019  . Situs inversus   . Syphilis 01/23/2019    Outpatient Medications Prior to Visit  Medication Sig Dispense Refill  . acetaminophen (TYLENOL) 500 MG tablet Take 500 mg by mouth every 6 (six) hours as needed (pain).     Marland Kitchen albuterol (PROVENTIL) (2.5 MG/3ML) 0.083% nebulizer solution Take 2.5 mg by nebulization every 6 (six) hours as needed for wheezing.    Marland Kitchen albuterol (VENTOLIN HFA) 108 (90 Base) MCG/ACT inhaler Inhale 2 puffs into the lungs every 6 (six) hours as needed for wheezing. 18 g 3  . dolutegravir (TIVICAY) 50 MG tablet Take 1 tablet (50 mg total) by mouth daily. 30 tablet 5  . emtricitabine-tenofovir AF (DESCOVY) 200-25 MG tablet Take 1 tablet by mouth daily. 30 tablet 5  . diphenoxylate-atropine (LOMOTIL) 2.5-0.025 MG tablet Take 1 tablet by mouth 4 (four) times daily as needed for diarrhea or loose stools. (Patient not taking: Reported on 12/19/2019) 30 tablet 0  . fluticasone (FLOVENT HFA) 44 MCG/ACT inhaler Inhale 2 puffs into the  lungs in the morning and at bedtime. 1 Inhaler 12  . ibuprofen (ADVIL,MOTRIN) 600 MG tablet Take 1 tablet (600 mg total) by mouth every 6 (six) hours as needed. (Patient not taking: Reported on 05/09/2019) 30 tablet 0   No facility-administered medications prior to visit.     Allergies  Allergen Reactions  . Shrimp [Shellfish Allergy]   . Sulfa Antibiotics Rash    Social History   Tobacco Use  . Smoking status: Never Smoker  . Smokeless tobacco: Never Used  Substance Use Topics  . Alcohol  use: Yes    Comment: socially   . Drug use: Yes    Types: Marijuana    Social History   Substance and Sexual Activity  Sexual Activity Not Currently  . Partners: Male     Objective:   Vitals:   12/19/19 0906  BP: 126/80  Pulse: 84  Temp: 97.9 F (36.6 C)  SpO2: 100%  Weight: 165 lb (74.8 kg)  Height: '5\' 9"'  (1.753 m)   Body mass index is 24.37 kg/m.  Physical Exam Vitals reviewed.  Constitutional:      Comments: Seated in chair in no physical distress.   HENT:     Mouth/Throat:     Mouth: No oral lesions.     Dentition: Normal dentition. No dental caries.  Eyes:     General: No scleral icterus. Cardiovascular:     Rate and Rhythm: Normal rate and regular rhythm.     Heart sounds: Normal heart sounds.  Pulmonary:     Effort: Pulmonary effort is normal.     Breath sounds: Normal breath sounds.  Abdominal:     General: There is no distension.     Palpations: Abdomen is soft.     Tenderness: There is no abdominal tenderness.  Lymphadenopathy:     Cervical: No cervical adenopathy.  Skin:    General: Skin is warm and dry.     Findings: No rash.  Neurological:     Mental Status: He is alert and oriented to person, place, and time.  Psychiatric:        Thought Content: Thought content normal.     Comments: In good spirits today. Smiling.      Lab Results Lab Results  Component Value Date   WBC 6.4 01/17/2019   HGB 13.1 (L) 01/17/2019   HCT 39.8 01/17/2019   MCV 82.9 01/17/2019   PLT 235 01/17/2019    Lab Results  Component Value Date   CREATININE 0.88 01/17/2019   BUN 14 01/17/2019   NA 134 (L) 01/17/2019   K 3.9 01/17/2019   CL 100 01/17/2019   CO2 25 01/17/2019    Lab Results  Component Value Date   ALT 23 01/17/2019   AST 20 01/17/2019   ALKPHOS 265 02/09/2010   BILITOT 0.3 01/17/2019    Lab Results  Component Value Date   CHOL 131 01/17/2019   HDL 31 (L) 01/17/2019   LDLCALC 84 01/17/2019   TRIG 69 01/17/2019   CHOLHDL 4.2  01/17/2019   HIV 1 RNA Quant (copies/mL)  Date Value  11/21/2019 220,000 (H)  05/09/2019 307,000 (H)  01/17/2019 223,000 (H)   CD4 T Cell Abs (/uL)  Date Value  11/21/2019 220 (L)  05/09/2019 300 (L)  01/17/2019 360 (L)     Assessment & Plan:   Problem List Items Addressed This Visit      High   HIV (human immunodeficiency virus infection) ([place]) - Primary (Chronic)  Seems that he is doing very well with resuming his Tivicay and Descovy. He Kelly Knight continue this everyday. Kelly Knight have him sign for consideration of Cabenuva today so we can start that process for him.   I would like for him to stay on the current regimen for another 2 months prior to transitioning to oral lead in with Cabotegravir/Rilpivirine x 28d so we can keep viral load suppressed.  RTC in 2 months.       Relevant Orders   HIV-1 RNA quant-no reflex-bld     Unprioritized   History of syphilis    Initial titer very high in 01-2019 1:2056. Received 3 injections of Bicillin 05/09/19, 05/16/19, 05/30/19. Titer follow up 1:128 with appropriate drop 4-fold.  Titer now 1:64 - still elevated but may still be dropping and finding nadir. Kelly Knight follow up again in 3 months. May need to consider LP to evaluate for asymptomatic neurosyphilis if titer remains 1:64; discussed with Kelly Knight today and understands the plan.       Anxiety and depression    He has made good strides forward in dealing with his anxiety and depression. He now has a part time job which has been very positive for him.  He Kelly Knight continue to work with Marcie Bal.          Janene Madeira, MSN, NP-C [facility] Infect[facility] Disease [email or web address] Pager: [phone] Office: [place]: [phone]   12/19/19  11:24 AM

## 2019-12-19 NOTE — Assessment & Plan Note (Addendum)
He has made good strides forward in dealing with his anxiety and depression. He now has a part time job which has been very positive for him.  He will continue to work with Marylu Lund.

## 2019-12-21 LAB — HIV-1 RNA QUANT-NO REFLEX-BLD
HIV 1 RNA Quant: 82 copies/mL — ABNORMAL HIGH
HIV-1 RNA Quant, Log: 1.91 Log copies/mL — ABNORMAL HIGH

## 2019-12-24 ENCOUNTER — Other Ambulatory Visit: Payer: Self-pay

## 2019-12-25 ENCOUNTER — Telehealth: Payer: Self-pay | Admitting: *Deleted

## 2019-12-25 NOTE — Telephone Encounter (Signed)
RN called patient regarding lomotil refill request. Per patient, he has restarted descovy/tivicay and feels his diarrhea is related to that. It is not as severe as last summer. He will try immodium at [facility] request, will call back if this is ineffective. Andree Coss, RN

## 2020-02-12 ENCOUNTER — Telehealth: Payer: Self-pay | Admitting: Pharmacist

## 2020-02-12 NOTE — Telephone Encounter (Signed)
Faxed patient's Cabenuva enrollment form to [facility] today. They will assess patient's insurance and send a benefits investigation form detailing how to start the injections (where to get it from and cost). Once benefits investigation is complete, we will order oral lead-in therapy and start patient on treatment. Will update encounter with further details when available. 

## 2020-02-17 ENCOUNTER — Telehealth: Payer: Self-pay

## 2020-02-17 NOTE — Telephone Encounter (Signed)
COVID-19 Pre-Screening Questions:02/17/20  Do you currently have a fever (>100 F), chills or unexplained body aches? NO  Are you currently experiencing new cough, shortness of breath, sore throat, runny nose? NO  .  Have you recently travelled outside the state of [place] in the last 14 days? NO .  Have you been in contact with someone that is currently pending confirmation of Covid19 testing or has been confirmed to have the Covid19 virus? NO  **If the patient answers NO to ALL questions -  advise the patient to please call the clinic before coming to the office should any symptoms develop.     

## 2020-02-18 ENCOUNTER — Other Ambulatory Visit: Payer: Self-pay

## 2020-02-18 ENCOUNTER — Ambulatory Visit (INDEPENDENT_AMBULATORY_CARE_PROVIDER_SITE_OTHER): Payer: Self-pay | Admitting: Infectious Diseases

## 2020-02-18 ENCOUNTER — Encounter: Payer: Self-pay | Admitting: Infectious Diseases

## 2020-02-18 VITALS — BP 125/80 | HR 70 | Temp 98.1°F | Ht 70.0 in | Wt 157.0 lb

## 2020-02-18 DIAGNOSIS — F419 Anxiety disorder, unspecified: Secondary | ICD-10-CM

## 2020-02-18 DIAGNOSIS — Z8619 Personal history of other infectious and parasitic diseases: Secondary | ICD-10-CM

## 2020-02-18 DIAGNOSIS — Z21 Asymptomatic human immunodeficiency virus [HIV] infection status: Secondary | ICD-10-CM

## 2020-02-18 DIAGNOSIS — F329 Major depressive disorder, single episode, unspecified: Secondary | ICD-10-CM

## 2020-02-18 NOTE — Progress Notes (Signed)
Name: Kelly Knight  DOB: 1996-05-14 MRN: [id number] PCP: Patient, No Pcp Per    Patient Active Problem List   Diagnosis Date Noted  . HIV (human immunodeficiency virus infection) ([place]) 01/23/2019    Priority: High  . Anxiety and depression 05/10/2019  . History of syphilis 01/23/2019     Brief Narrative:  Kelly Knight is a 24 y.o. male with HIV disease, Dx 10/2018. CD4 nadir unknown.  HIV Risk: MSM History of OIs: none Intake Labs 01/2019: Hep B sAg (-) sAb (-)  cAb (-); hep A (+); hep c Ab (-) VL 223,000 CD4  RPR 1:2056 --> 1:128 --> 1:64  Quantiferon (-) HLA*B 5701 (-)   Previous Regimens: . Tivicay + Descovy (needs crushable regimen)   Genotypes: . 01/2019: K103N   Subjective:  CC:  HIV follow up care. Depression follow up - he is much improved and in a good head space.    HPI: Kelly Knight is here for follow up and feeling very good today. He has been working with Marcie Bal our counselor and surrounded himself with supportive family/friends and is in a much better space. He is starting school this summer at [employer] for communications major. No changes in his weight  He has done great taking his Tivicay and Descovy and hasn't missed any doses. He does on occasion need to crush it up in yogurt. He was having some diarrhea but this has since resolved with time and food from the [employer]. Rice was especially helpful.    Review of Systems  Constitutional: Negative for appetite change, chills, fatigue, fever and unexpected weight change.  HENT: Negative for sore throat.   Eyes: Negative for visual disturbance.  Respiratory: Negative for cough, chest tightness, shortness of breath and wheezing.   Cardiovascular: Negative for chest pain and leg swelling.  Gastrointestinal: Negative for abdominal pain, diarrhea and nausea.  Genitourinary: Negative for discharge, dysuria and genital sores.  Musculoskeletal: Negative for joint swelling.  Skin: Negative for color change and  rash.  Neurological: Negative for dizziness and headaches.  Hematological: Negative for adenopathy.  Psychiatric/Behavioral: Negative for dysphoric mood and sleep disturbance. The patient is not nervous/anxious.      Past Medical History:  Diagnosis Date  . Asthma   . Heart murmur   . HIV (human immunodeficiency virus infection) ([place]) 01/23/2019  . Situs inversus   . Syphilis 01/23/2019    Outpatient Medications Prior to Visit  Medication Sig Dispense Refill  . albuterol (VENTOLIN HFA) 108 (90 Base) MCG/ACT inhaler Inhale 2 puffs into the lungs every 6 (six) hours as needed for wheezing. 18 g 3  . dolutegravir (TIVICAY) 50 MG tablet Take 1 tablet (50 mg total) by mouth daily. 30 tablet 5  . emtricitabine-tenofovir AF (DESCOVY) 200-25 MG tablet Take 1 tablet by mouth daily. 30 tablet 5  . acetaminophen (TYLENOL) 500 MG tablet Take 500 mg by mouth every 6 (six) hours as needed (pain).     Marland Kitchen albuterol (PROVENTIL) (2.5 MG/3ML) 0.083% nebulizer solution Take 2.5 mg by nebulization every 6 (six) hours as needed for wheezing.    . diphenoxylate-atropine (LOMOTIL) 2.5-0.025 MG tablet Take 1 tablet by mouth 4 (four) times daily as needed for diarrhea or loose stools. (Patient not taking: Reported on 12/19/2019) 30 tablet 0   No facility-administered medications prior to visit.     Allergies  Allergen Reactions  . Shrimp [Shellfish Allergy]   . Sulfa Antibiotics Rash    Social History   Tobacco  Use  . Smoking status: Never Smoker  . Smokeless tobacco: Never Used  Substance Use Topics  . Alcohol use: Yes    Comment: socially   . Drug use: Yes    Types: Marijuana    Social History   Substance and Sexual Activity  Sexual Activity Not Currently  . Partners: Male     Objective:   Vitals:   02/18/20 0908  BP: 125/80  Pulse: 70  Temp: 98.1 F (36.7 C)  SpO2: 99%  Weight: 157 lb (71.2 kg)  Height: '5\' 10"'  (1.778 m)   Body mass index is 22.53 kg/m.  Physical Exam Vitals  reviewed.  Constitutional:      Comments: Seated in chair in no physical distress.   HENT:     Mouth/Throat:     Mouth: No oral lesions.     Dentition: Normal dentition. No dental caries.  Eyes:     General: No scleral icterus. Cardiovascular:     Rate and Rhythm: Normal rate and regular rhythm.     Heart sounds: Normal heart sounds.  Pulmonary:     Effort: Pulmonary effort is normal.     Breath sounds: Normal breath sounds.  Abdominal:     General: There is no distension.     Palpations: Abdomen is soft.     Tenderness: There is no abdominal tenderness.  Lymphadenopathy:     Cervical: No cervical adenopathy.  Skin:    General: Skin is warm and dry.     Findings: No rash.  Neurological:     Mental Status: He is alert and oriented to person, place, and time.  Psychiatric:        Thought Content: Thought content normal.     Comments: In good spirits today. Smiling.      Lab Results Lab Results  Component Value Date   WBC 6.4 01/17/2019   HGB 13.1 (L) 01/17/2019   HCT 39.8 01/17/2019   MCV 82.9 01/17/2019   PLT 235 01/17/2019    Lab Results  Component Value Date   CREATININE 0.88 01/17/2019   BUN 14 01/17/2019   NA 134 (L) 01/17/2019   K 3.9 01/17/2019   CL 100 01/17/2019   CO2 25 01/17/2019    Lab Results  Component Value Date   ALT 23 01/17/2019   AST 20 01/17/2019   ALKPHOS 265 02/09/2010   BILITOT 0.3 01/17/2019    Lab Results  Component Value Date   CHOL 131 01/17/2019   HDL 31 (L) 01/17/2019   LDLCALC 84 01/17/2019   TRIG 69 01/17/2019   CHOLHDL 4.2 01/17/2019   HIV 1 RNA Quant (copies/mL)  Date Value  12/19/2019 82 (H)  11/21/2019 220,000 (H)  05/09/2019 307,000 (H)   CD4 T Cell Abs (/uL)  Date Value  11/21/2019 220 (L)  05/09/2019 300 (L)  01/17/2019 360 (L)     Assessment & Plan:   Problem List Items Addressed This Visit      High   HIV (human immunodeficiency virus infection) ([place] ) - Primary (Chronic)    He has done very well  on Tivicay and Descovy getting back on track. Kelly Knight update VL and CD4 today to follow for therapeutic effect and to help give him reassurance he is undetectable.  He is taking this correctly and no concerns with drug interactions at this time.  He is going to get the COVID vaccine soon - Kelly Knight defer pneumovax until he completes this.  RTC in 3-4 months.  Relevant Orders   HIV-1 RNA quant-no reflex-bld   T-helper cell (CD4)- ([facility] clinic only)     Unprioritized   History of syphilis    Follow RPR titer with next lab draw.       Anxiety and depression    Much improved today - Kelly Knight continue to follow closely for support and see how he is doing getting back in school.          Janene Madeira, MSN, NP-C [facility] [email or web address] Pager: [phone] Office: [place]: [phone]   02/18/20  9:35 AM

## 2020-02-18 NOTE — Assessment & Plan Note (Signed)
He has done very well on Tivicay and Descovy getting back on track. Will update VL and CD4 today to follow for therapeutic effect and to help give him reassurance he is undetectable.  He is taking this correctly and no concerns with drug interactions at this time.  He is going to get the COVID vaccine soon - will defer pneumovax until he completes this.  RTC in 3-4 months.

## 2020-02-18 NOTE — Assessment & Plan Note (Signed)
Much improved today - will continue to follow closely for support and see how he is doing getting back in school.

## 2020-02-18 NOTE — Assessment & Plan Note (Signed)
Follow RPR titer with next lab draw.

## 2020-02-18 NOTE — Patient Instructions (Addendum)
Please continue your Tivicay and Descovy - you are taking it perfectly. Please stop by the lab on your way out so we can update your blood work.   Hopefully we can get the process for [facility] for you soon - it will be a slow roll out for [place] but you are one of the first on my list!  Radene Knee is a great option if you can tolerate. Ocean spray saline rinse will also be helpful for you.  Flonase - this is a allergy nasal spray that will be helpful for you in the mornings everyday when you wake up.   Please schedule a follow up in 3 months. Can do labs same day

## 2020-02-19 LAB — T-HELPER CELL (CD4) - (RCID CLINIC ONLY)
CD4 % Helper T Cell: 11 % — ABNORMAL LOW (ref 33–65)
CD4 T Cell Abs: 296 /uL — ABNORMAL LOW (ref 400–1790)

## 2020-02-21 LAB — HIV-1 RNA QUANT-NO REFLEX-BLD
HIV 1 RNA Quant: <20 copies/mL — AB
HIV-1 RNA Quant, Log: <1.3 Log copies/mL — AB

## 2020-05-11 ENCOUNTER — Other Ambulatory Visit: Payer: Self-pay | Admitting: Infectious Diseases

## 2020-05-11 DIAGNOSIS — B20 Human immunodeficiency virus [HIV] disease: Secondary | ICD-10-CM

## 2020-06-02 ENCOUNTER — Other Ambulatory Visit: Payer: Self-pay

## 2020-06-02 ENCOUNTER — Encounter: Payer: Self-pay | Admitting: Infectious Diseases

## 2020-06-02 ENCOUNTER — Ambulatory Visit (INDEPENDENT_AMBULATORY_CARE_PROVIDER_SITE_OTHER): Payer: Self-pay | Admitting: Infectious Diseases

## 2020-06-02 VITALS — BP 120/74 | HR 55 | Temp 98.3°F | Wt 150.0 lb

## 2020-06-02 DIAGNOSIS — Z21 Asymptomatic human immunodeficiency virus [HIV] infection status: Secondary | ICD-10-CM

## 2020-06-02 DIAGNOSIS — A539 Syphilis, unspecified: Secondary | ICD-10-CM

## 2020-06-02 DIAGNOSIS — Z9109 Other allergy status, other than to drugs and biological substances: Secondary | ICD-10-CM

## 2020-06-02 MED ORDER — ALBUTEROL SULFATE HFA 108 (90 BASE) MCG/ACT IN AERS
2.0000 | INHALATION_SPRAY | Freq: Four times a day (QID) | RESPIRATORY_TRACT | 6 refills | Status: DC | PRN
Start: 2020-06-02 — End: 2021-10-29

## 2020-06-02 NOTE — Progress Notes (Signed)
Name: Kelly Knight  DOB: 07-Mar-1996 MRN: [id number] PCP: Patient, No Pcp Per    Patient Active Problem List   Diagnosis Date Noted   HIV (human immunodeficiency virus infection) ([place]) 01/23/2019    Priority: High   Environmental allergies 07/05/2020   Anxiety and depression 05/10/2019   History of syphilis 01/23/2019     Brief Narrative:  Kelly Knight is a 24 y.o. male with HIV disease, Dx 10/2018. CD4 nadir unknown.  HIV Risk: MSM History of OIs: none Intake Labs 01/2019: Hep B sAg (-) sAb (-)  cAb (-); hep A (+); hep c Ab (-) VL 223,000 CD4  RPR 1:2056 --> 1:128 --> 1:64  Quantiferon (-) HLA*B 5701 (-)   Previous Regimens:  Tivicay + Descovy (needs crushable regimen)   Genotypes:  01/2019: K103N   Subjective:  CC:  HIV follow up care.  Allergies and coughing with mold in apartment.      HPI: Kelly Knight is doing well. He is getting ready to start a new job in [place] and planning a move but would like to continue care here locally with [place].  He continues to take his Tivicay and Descovy with no trouble.  No missed doses.  Mood continues to do better and better and he is sleeping well.  Main concern today includes coughing and increased allergies likely from mold in his apartment.  He has been working with his landlord trying to get this taken care of, however not much has been done.  He has not taken much over-the-counter to help and made some suggestions.  Cough is worse at night.  He does have his inhaler if needed which does help.    Review of Systems  Constitutional: Negative for appetite change, chills, fatigue, fever and unexpected weight change.  HENT: Positive for postnasal drip and rhinorrhea. Negative for sore throat.   Eyes: Negative for visual disturbance.  Respiratory: Positive for cough and wheezing. Negative for chest tightness and shortness of breath.   Cardiovascular: Negative for chest pain and leg swelling.  Gastrointestinal:  Negative for abdominal pain, diarrhea and nausea.  Genitourinary: Negative for discharge, dysuria and genital sores.  Musculoskeletal: Negative for joint swelling.  Skin: Negative for color change and rash.  Neurological: Negative for dizziness and headaches.  Hematological: Negative for adenopathy.  Psychiatric/Behavioral: Negative for dysphoric mood and sleep disturbance. The patient is not nervous/anxious.      Past Medical History:  Diagnosis Date   Asthma    Heart murmur    HIV (human immunodeficiency virus infection) ([place]) 01/23/2019   Situs inversus    Syphilis 01/23/2019    Outpatient Medications Prior to Visit  Medication Sig Dispense Refill   acetaminophen (TYLENOL) 500 MG tablet Take 500 mg by mouth every 6 (six) hours as needed (pain).      albuterol (PROVENTIL) (2.5 MG/3ML) 0.083% nebulizer solution Take 2.5 mg by nebulization every 6 (six) hours as needed for wheezing.     albuterol (VENTOLIN HFA) 108 (90 Base) MCG/ACT inhaler Inhale 2 puffs into the lungs every 6 (six) hours as needed for wheezing. 18 g 3   DESCOVY 200-25 MG tablet TAKE 1 TABLET BY MOUTH DAILY 30 tablet 5   diphenoxylate-atropine (LOMOTIL) 2.5-0.025 MG tablet Take 1 tablet by mouth 4 (four) times daily as needed for diarrhea or loose stools. 30 tablet 0   TIVICAY 50 MG tablet TAKE 1 TABLET(50 MG) BY MOUTH DAILY 30 tablet 5   No facility-administered medications prior to visit.  Allergies  Allergen Reactions   Shrimp [Shellfish Allergy]    Sulfa Antibiotics Rash    Social History   Tobacco Use   Smoking status: Never Smoker   Smokeless tobacco: Never Used  Substance Use Topics   Alcohol use: Yes    Comment: socially    Drug use: Not Currently    Types: Marijuana    Social History   Substance and Sexual Activity  Sexual Activity Not Currently   Partners: Male     Objective:   Vitals:   06/02/20 0848  BP: 120/74  Pulse: (!) 55  Temp: 98.3 F (36.8 C)   TempSrc: Oral  Weight: 150 lb (68 kg)   Body mass index is 21.52 kg/m.  Physical Exam Vitals reviewed.  Constitutional:      Comments: Seated in chair in no physical distress.   HENT:     Nose: Congestion present. No rhinorrhea.     Mouth/Throat:     Mouth: Mucous membranes are moist. No oral lesions.     Dentition: Normal dentition. No dental caries.     Pharynx: Posterior oropharyngeal erythema present. No oropharyngeal exudate.  Eyes:     General: No scleral icterus. Cardiovascular:     Rate and Rhythm: Normal rate and regular rhythm.     Heart sounds: Normal heart sounds.  Pulmonary:     Effort: Pulmonary effort is normal.     Breath sounds: Normal breath sounds.  Abdominal:     General: There is no distension.     Palpations: Abdomen is soft.     Tenderness: There is no abdominal tenderness.  Lymphadenopathy:     Cervical: No cervical adenopathy.  Skin:    General: Skin is warm and dry.     Findings: No rash.  Neurological:     Mental Status: He is alert and oriented to person, place, and time.  Psychiatric:        Thought Content: Thought content normal.     Comments: In good spirits today. Smiling.      Lab Results Lab Results  Component Value Date   WBC 6.4 01/17/2019   HGB 13.1 (L) 01/17/2019   HCT 39.8 01/17/2019   MCV 82.9 01/17/2019   PLT 235 01/17/2019    Lab Results  Component Value Date   CREATININE 0.88 01/17/2019   BUN 14 01/17/2019   NA 134 (L) 01/17/2019   K 3.9 01/17/2019   CL 100 01/17/2019   CO2 25 01/17/2019    Lab Results  Component Value Date   ALT 23 01/17/2019   AST 20 01/17/2019   ALKPHOS 265 02/09/2010   BILITOT 0.3 01/17/2019    Lab Results  Component Value Date   CHOL 131 01/17/2019   HDL 31 (L) 01/17/2019   LDLCALC 84 01/17/2019   TRIG 69 01/17/2019   CHOLHDL 4.2 01/17/2019   HIV 1 RNA Quant  Date Value  06/02/2020 78 Copies/mL (H)  02/18/2020 <20 DETECTED copies/mL (A)  12/19/2019 82 copies/mL (H)   CD4  T Cell Abs (/uL)  Date Value  06/02/2020 304 (L)  02/18/2020 296 (L)  11/21/2019 220 (L)     Assessment & Plan:   Problem List Items Addressed This Visit      High   HIV (human immunodeficiency virus infection) ([place]) - Primary (Chronic)    We Kelly Knight check pertinent labs today to ensure on still undetectable.  We Kelly Knight continue Tivicay and Descovy once daily. Return in about 4 months (around 10/02/2020).  Relevant Orders   HIV-1 RNA quant-no reflex-bld (Completed)   T-helper cell (CD4)- ([facility] clinic only) (Completed)     Unprioritized   Environmental allergies    Discussed treatment today with over-the-counter antihistamines, flonase, inhaler at night and consider adding dehumidifier to room for moisture control.        Other Visit Diagnoses    Syphilis       Relevant Orders   RPR (Completed)      Janene Madeira, MSN, NP-C [place] for Infectious Disease [email or web address] Pager: [phone] Office: [phone] [facility] Main Line: [phone]   07/05/20  1:45 PM

## 2020-06-02 NOTE — Patient Instructions (Addendum)
Continue your Tivicay and Descovy once a day - stop by the lab on your way out  For your allergies   1. I sent in an inhaler for you to use at night to help with cough  2. Start taking a generic version of the antihistamines over the counter (Zyrtec, Claritin, Xyzal, or Allegra - Benadryl at night if you need that to sleep)  3. Flonase one spray per nostril 1-2 times a day for nasal congestion (get a coupon)    Consider a dehumidifier for your apartment if you can to help with the moisture   Will see you back in 4 months   Please get your flu shot in the fall

## 2020-06-03 LAB — T-HELPER CELL (CD4) - (RCID CLINIC ONLY)
CD4 % Helper T Cell: 15 % — ABNORMAL LOW (ref 33–65)
CD4 T Cell Abs: 304 /uL — ABNORMAL LOW (ref 400–1790)

## 2020-06-04 LAB — HIV-1 RNA QUANT-NO REFLEX-BLD
HIV 1 RNA Quant: 78 Copies/mL — ABNORMAL HIGH
HIV-1 RNA Quant, Log: 1.89 Log cps/mL — ABNORMAL HIGH

## 2020-06-04 LAB — RPR TITER: RPR Titer: 1:64 {titer} — ABNORMAL HIGH

## 2020-06-04 LAB — RPR: RPR Ser Ql: REACTIVE — AB

## 2020-06-04 LAB — FLUORESCENT TREPONEMAL AB(FTA)-IGG-BLD: Fluorescent Treponemal ABS: REACTIVE — AB

## 2020-07-05 DIAGNOSIS — Z9109 Other allergy status, other than to drugs and biological substances: Secondary | ICD-10-CM | POA: Insufficient documentation

## 2020-07-05 NOTE — Assessment & Plan Note (Signed)
Discussed treatment today with over-the-counter antihistamines, flonase, inhaler at night and consider adding dehumidifier to room for moisture control.

## 2020-07-05 NOTE — Assessment & Plan Note (Signed)
We will check pertinent labs today to ensure on still undetectable.  We will continue Tivicay and Descovy once daily. Return in about 4 months (around 10/02/2020).

## 2020-07-29 ENCOUNTER — Telehealth: Payer: Self-pay

## 2020-07-29 NOTE — Telephone Encounter (Signed)
My Chart Cabenuva Message  

## 2020-09-22 ENCOUNTER — Telehealth: Payer: Self-pay | Admitting: Pharmacist

## 2020-09-22 NOTE — Telephone Encounter (Signed)
Called patient to discuss starting Cabenuva. No answer, left HIPAA compliant VM to call me back to schedule an appointment with me to start oral lead-in therapy.

## 2020-10-01 ENCOUNTER — Ambulatory Visit: Payer: Medicaid Other | Admitting: Infectious Diseases

## 2020-10-27 ENCOUNTER — Ambulatory Visit: Payer: Medicaid Other | Admitting: Infectious Diseases

## 2020-11-16 ENCOUNTER — Other Ambulatory Visit: Payer: Self-pay | Admitting: Infectious Diseases

## 2020-11-16 DIAGNOSIS — B20 Human immunodeficiency virus [HIV] disease: Secondary | ICD-10-CM

## 2020-11-23 ENCOUNTER — Telehealth: Payer: Self-pay

## 2020-11-23 NOTE — Telephone Encounter (Signed)
Left voice mail to call back to schedule office visit with Dixon.

## 2020-11-24 ENCOUNTER — Telehealth: Payer: Self-pay

## 2020-11-24 DIAGNOSIS — B20 Human immunodeficiency virus [HIV] disease: Secondary | ICD-10-CM

## 2020-11-24 MED ORDER — TIVICAY 50 MG PO TABS: ORAL_TABLET | ORAL | 0 refills | Status: DC

## 2020-11-24 MED ORDER — DESCOVY 200-25 MG PO TABS: 1.0000 | ORAL_TABLET | Freq: Every day | ORAL | 0 refills | Status: DC

## 2020-11-24 NOTE — Telephone Encounter (Signed)
Received call from patient, he is scheduled to see Judeth Cornfield on 12/14/20 but thinks he will run out of medication a couple of days before then. RN advised that we will send in one refill, but that he must keep his upcoming appointment for further refills. Patient verbalized understanding. He is asking about the Cabenuva injections, RN advised him to discuss possible medication switch with Judeth Cornfield at upcoming appointment.   Sandie Ano, RN

## 2020-12-13 ENCOUNTER — Other Ambulatory Visit: Payer: Self-pay | Admitting: Infectious Diseases

## 2020-12-13 DIAGNOSIS — B20 Human immunodeficiency virus [HIV] disease: Secondary | ICD-10-CM

## 2020-12-14 ENCOUNTER — Other Ambulatory Visit: Payer: Self-pay

## 2020-12-14 ENCOUNTER — Ambulatory Visit: Payer: Self-pay

## 2020-12-14 ENCOUNTER — Telehealth (INDEPENDENT_AMBULATORY_CARE_PROVIDER_SITE_OTHER): Payer: Self-pay | Admitting: Infectious Diseases

## 2020-12-14 ENCOUNTER — Ambulatory Visit (INDEPENDENT_AMBULATORY_CARE_PROVIDER_SITE_OTHER): Payer: Self-pay

## 2020-12-14 ENCOUNTER — Encounter: Payer: Self-pay | Admitting: Infectious Diseases

## 2020-12-14 DIAGNOSIS — F419 Anxiety disorder, unspecified: Secondary | ICD-10-CM

## 2020-12-14 DIAGNOSIS — B20 Human immunodeficiency virus [HIV] disease: Secondary | ICD-10-CM

## 2020-12-14 DIAGNOSIS — F32A Depression, unspecified: Secondary | ICD-10-CM

## 2020-12-14 DIAGNOSIS — A539 Syphilis, unspecified: Secondary | ICD-10-CM

## 2020-12-14 DIAGNOSIS — Z21 Asymptomatic human immunodeficiency virus [HIV] infection status: Secondary | ICD-10-CM

## 2020-12-14 DIAGNOSIS — Z23 Encounter for immunization: Secondary | ICD-10-CM

## 2020-12-14 DIAGNOSIS — Z8619 Personal history of other infectious and parasitic diseases: Secondary | ICD-10-CM

## 2020-12-14 MED ORDER — DESCOVY 200-25 MG PO TABS: 1.0000 | ORAL_TABLET | Freq: Every day | ORAL | 5 refills | Status: DC

## 2020-12-14 MED ORDER — TIVICAY 50 MG PO TABS: ORAL_TABLET | ORAL | 5 refills | Status: DC

## 2020-12-14 NOTE — Assessment & Plan Note (Signed)
Has had intermittent migraines since syphilis infection with very hight titer > 2056. He has had a persistent titer 1:64 now and I worry about asymptomatic CNS infection. We discussed options with getting LP to rule in/out vs empiric treatment with IV penicillin - he would rather just do the treatment to see if it helps. Will see what his titer looks like today and consult with [facility] for possible charity care and PICC placement.

## 2020-12-14 NOTE — Progress Notes (Signed)
Administered Pneumovax 23 and Menveo vaccines per Rexene Alberts, NP. Patient tolerated well.   Sandie Ano, RN

## 2020-12-14 NOTE — Assessment & Plan Note (Signed)
Doing well on Tivicay and Descvy - low level viremia < 100 copies last lab check. He has transportation set up to make it here this afternoon for labs and vaccines. Will give Pneumovax and Menveo #1.  Needs second menveo after 2 months.  STI screening at next OV. UMAP to be taken care of today to re-enroll.  Discussed cabenuva Q64m indication. He would like to proceed. Discussed that we will need to cycle in patients on this therapy to be successful and will hopefully get this started for him the next OV. He is completely fine with waiting until then.  FU in August 2022.

## 2020-12-14 NOTE — Progress Notes (Signed)
Name: Kelly Knight  DOB: 1996-02-03 MRN: [id number] PCP: Patient, No Pcp Per   VIRTUAL CARE ENCOUNTER  I connected with Kelly Knight on 12/14/20 at  8:45 AM EST by VIDEO and verified that I am speaking with the correct person using two identifiers.   I discussed the limitations, risks, security and privacy concerns of performing an evaluation and management service by telephone and the availability of in person appointments. I also discussed with the patient that there may be a patient responsible charge related to this service. The patient expressed understanding and agreed to proceed.  Patient Location: [facility]   Other Participants: none  Provider Location: [facility]    Brief Narrative:  Kelly Knight is a 25 y.o. male with HIV disease, Dx 10/2018. CD4 nadir unknown.  HIV Risk: MSM History of OIs: none Intake Labs 01/2019: Hep B sAg (-) sAb (-)  cAb (-); hep A (+); hep c Ab (-) VL 223,000 CD4  RPR 1:2056 --> 1:128 --> 1:64  Quantiferon (-) HLA*B 5701 (-)   Previous Regimens: . Tivicay + Descovy (needs crushable regimen)   Genotypes: . 01/2019: K103N   Subjective:  CC:  HIV follow up care.     HPI: Having trouble with an old boyfriend stalking him. Working with police and apartment staff for his safety and organizing a plan. Lost his job which has hit him hard emotionally and with regards to his mood. Having trouble sleeping and keeping his mood positive. He was working with someone at [employer], however cannot be fully open and candid with this person. Open to seeing Marcie Bal again in consult.   Missed his Tivicay + Descovy only once with a bad migraine when he was nauseated and afraid he would throw it up. Continues to crush and swallow pills. Very interested in [facility] still. Same boyfriend and no other partners.   One bad migraine last Thursday - that was the first one in 4-5 months. OTC medication works well to abort.    Review of Systems   Constitutional: Positive for fatigue. Negative for appetite change, chills, fever and unexpected weight change.  Eyes: Negative for visual disturbance.  Respiratory: Negative for cough and shortness of breath.   Cardiovascular: Negative for chest pain and leg swelling.  Gastrointestinal: Negative for abdominal pain, diarrhea and nausea.  Genitourinary: Negative for dysuria, genital sores and penile discharge.  Musculoskeletal: Negative for joint swelling.  Skin: Negative for color change and rash.  Neurological: Positive for headaches. Negative for dizziness.  Hematological: Negative for adenopathy.  Psychiatric/Behavioral: Positive for dysphoric mood. Negative for sleep disturbance. The patient is not nervous/anxious.      Past Medical History:  Diagnosis Date  . Asthma   . Heart murmur   . HIV (human immunodeficiency virus infection) (Kelly Knight) 01/23/2019  . Situs inversus   . Syphilis 01/23/2019    Outpatient Medications Prior to Visit  Medication Sig Dispense Refill  . acetaminophen (TYLENOL) 500 MG tablet Take 500 mg by mouth every 6 (six) hours as needed (pain).     Marland Kitchen albuterol (PROVENTIL) (2.5 MG/3ML) 0.083% nebulizer solution Take 2.5 mg by nebulization every 6 (six) hours as needed for wheezing.    Marland Kitchen albuterol (VENTOLIN HFA) 108 (90 Base) MCG/ACT inhaler Inhale 2 puffs into the lungs every 6 (six) hours as needed for wheezing. 18 g 3  . albuterol (VENTOLIN HFA) 108 (90 Base) MCG/ACT inhaler Inhale 2 puffs into the lungs every 6 (six) hours as needed for  wheezing or shortness of breath. 8 g 6  . diphenoxylate-atropine (LOMOTIL) 2.5-0.025 MG tablet Take 1 tablet by mouth 4 (four) times daily as needed for diarrhea or loose stools. 30 tablet 0  . dolutegravir (TIVICAY) 50 MG tablet TAKE 1 TABLET(50 MG) BY MOUTH DAILY 30 tablet 0  . emtricitabine-tenofovir AF (DESCOVY) 200-25 MG tablet Take 1 tablet by mouth daily. 30 tablet 0   No facility-administered medications prior to visit.      Allergies  Allergen Reactions  . Shrimp [Shellfish Allergy]   . Sulfa Antibiotics Rash    Social History   Tobacco Use  . Smoking status: Never Smoker  . Smokeless tobacco: Never Used  Substance Use Topics  . Alcohol use: Yes    Comment: socially   . Drug use: Not Currently    Types: Marijuana    Social History   Substance and Sexual Activity  Sexual Activity Not Currently  . Partners: Male     Objective:   There were no vitals filed for this visit. There is no height or weight on file to calculate BMI.  Physical Exam Constitutional:      Appearance: Normal appearance. He is not ill-appearing.  HENT:     Head: Normocephalic.     Mouth/Throat:     Mouth: Mucous membranes are moist.     Pharynx: Oropharynx is clear.  Eyes:     General: No scleral icterus. Pulmonary:     Effort: Pulmonary effort is normal.  Musculoskeletal:        General: Normal range of motion.     Cervical back: Normal range of motion.  Skin:    Coloration: Skin is not jaundiced or pale.  Neurological:     Mental Status: He is alert and oriented to person, place, and time.  Psychiatric:        Mood and Affect: Mood normal.        Judgment: Judgment normal.     Lab Results Lab Results  Component Value Date   WBC 6.4 01/17/2019   HGB 13.1 (L) 01/17/2019   HCT 39.8 01/17/2019   MCV 82.9 01/17/2019   PLT 235 01/17/2019    Lab Results  Component Value Date   CREATININE 0.88 01/17/2019   BUN 14 01/17/2019   NA 134 (L) 01/17/2019   K 3.9 01/17/2019   CL 100 01/17/2019   CO2 25 01/17/2019    Lab Results  Component Value Date   ALT 23 01/17/2019   AST 20 01/17/2019   ALKPHOS 265 02/09/2010   BILITOT 0.3 01/17/2019    Lab Results  Component Value Date   CHOL 131 01/17/2019   HDL 31 (L) 01/17/2019   LDLCALC 84 01/17/2019   TRIG 69 01/17/2019   CHOLHDL 4.2 01/17/2019   HIV 1 RNA Quant  Date Value  06/02/2020 78 Copies/mL (H)  02/18/2020 <20 DETECTED copies/mL (A)   12/19/2019 82 copies/mL (H)   CD4 T Cell Abs (/uL)  Date Value  06/02/2020 304 (L)  02/18/2020 296 (L)  11/21/2019 220 (L)     Assessment & Plan:   Problem List Items Addressed This Visit      High   HIV (human immunodeficiency virus infection) ([place]) - Primary (Chronic)    Doing well on Tivicay and Descvy - low level viremia < 100 copies last lab check. He has transportation set up to make it here this afternoon for labs and vaccines. Will give Pneumovax and Menveo #1.  Needs second menveo after  2 months.  STI screening at next OV. UMAP to be taken care of today to re-enroll.  Discussed cabenuva Q83mindication. He would like to proceed. Discussed that we will need to cycle in patients on this therapy to be successful and will hopefully get this started for him the next OV. He is completely fine with waiting until then.  FU in August 2022.       Relevant Orders   HIV-1 RNA quant-no reflex-bld   T-helper cell (CD4)- ([facility] clinic only)   RPR   COMPLETE METABOLIC PANEL WITH GFR   CBC with Differential/Platelet     Unprioritized   History of syphilis    Has had intermittent migraines since syphilis infection with very hight titer > 2056. He has had a persistent titer 1:64 now and I worry about asymptomatic CNS infection. We discussed options with getting LP to rule in/out vs empiric treatment with IV penicillin - he would rather just do the treatment to see if it helps. Will see what his titer looks like today and [facility].       Anxiety and depression    Acutely worsened with stress at home. Sleep hygiene discussed - avoid naps during the day, stop taking melatonin in th afternoon and only at night, exercise/walking program, goal 7-8 hours of sleep a night.  Will set him up to re-engage with JMarcie Balthis week. He was most appreciative.          SJanene Madeira MSN, NP-C [facility] Infect[facility]  Disease [email or web address] Pager: [phone]: [address] [phone]  12/14/20  9:18 AM

## 2020-12-14 NOTE — Assessment & Plan Note (Signed)
Acutely worsened with stress at home. Sleep hygiene discussed - avoid naps during the day, stop taking melatonin in th afternoon and only at night, exercise/walking program, goal 7-8 hours of sleep a night.  Will set him up to re-engage with Marylu Lund this week. He was most appreciative.

## 2020-12-14 NOTE — Patient Instructions (Signed)
It was very nice to talk to you on the phone today.  I am glad to hear that you are staying well and keeping healthy.    Your medication is working perfectly for you and you are doing a great job taking it as prescribed  Please continue your Tivican and Descovy crushed once a day as we discussed.   I think we can get you started on Cabenuva every 2 months around August - will discuss again then.   I have you in to see Marylu Lund this Thursday afternoon.    Vaccines Recommended:   Pneumovax and Menveo today --> next dose of menveo due in 2 months  Recommended Next Office Visit:   August 2022 (appt has been made for you)  Please continue to be well, stay away from folks that are sick, wash her hands frequently, do not touch her face or mouth and continue to take your medications.    Recommendations for improving sleep:   Avoid having pets sleep in the bedroom  Avoid caffeine consumption after 4pm  Avoid naps in the afternoon if you can  Keep bedroom cool and conducive to sleep  Avoid nicotine use, especially in the evening  Avoid exercise within 2-3 hours before bedtime  Stimulus Control:   Go to bed only when sleepy  Use the bedroom for sleep and sex only  Go to another room if you are unable to fall asleep within 15 to 20 minutes  Read or engage in other quiet activities and return to bed only when sleepy.  Melatonin 5-10 mg gummy at night about 1 hour before you are ready to sleep is what I would suggest. Don't take in the afternoon since it may mess up your ability to sleep at night.

## 2020-12-14 NOTE — Addendum Note (Signed)
Addended by: Harley Alto on: 12/14/2020 03:25 PM   Modules accepted: Orders

## 2020-12-15 LAB — T-HELPER CELL (CD4) - (RCID CLINIC ONLY)
CD4 % Helper T Cell: 19 % — ABNORMAL LOW (ref 33–65)
CD4 T Cell Abs: 389 /uL — ABNORMAL LOW (ref 400–1790)

## 2020-12-17 ENCOUNTER — Ambulatory Visit: Payer: Medicaid Other

## 2020-12-17 LAB — RPR TITER: RPR Titer: 1:64 {titer} — ABNORMAL HIGH

## 2020-12-17 LAB — COMPLETE METABOLIC PANEL WITH GFR
AG Ratio: 1.1 (calc) (ref 1.0–2.5)
ALT: 9 U/L (ref 9–46)
AST: 13 U/L (ref 10–40)
Albumin: 4.2 g/dL (ref 3.6–5.1)
Alkaline phosphatase (APISO): 68 U/L (ref 36–130)
BUN: 12 mg/dL (ref 7–25)
CO2: 27 mmol/L (ref 20–32)
Calcium: 9.1 mg/dL (ref 8.6–10.3)
Chloride: 106 mmol/L (ref 98–110)
Creat: 1.08 mg/dL (ref 0.60–1.35)
GFR, Est African American: 110 mL/min/{1.73_m2} (ref >=60)
GFR, Est Non African American: 95 mL/min/{1.73_m2} (ref >=60)
Globulin: 3.8 g/dL (calc) — ABNORMAL HIGH (ref 1.9–3.7)
Glucose, Bld: 86 mg/dL (ref 65–99)
Potassium: 4.1 mmol/L (ref 3.5–5.3)
Sodium: 138 mmol/L (ref 135–146)
Total Bilirubin: 0.3 mg/dL (ref 0.2–1.2)
Total Protein: 8 g/dL (ref 6.1–8.1)

## 2020-12-17 LAB — CBC WITH DIFFERENTIAL/PLATELET
Absolute Monocytes: 400 cells/uL (ref 200–950)
Basophils Absolute: 19 cells/uL (ref 0–200)
Basophils Relative: 0.4 %
Eosinophils Absolute: 61 cells/uL (ref 15–500)
Eosinophils Relative: 1.3 %
HCT: 41.3 % (ref 38.5–50.0)
Hemoglobin: 13.9 g/dL (ref 13.2–17.1)
Lymphs Abs: 2157 cells/uL (ref 850–3900)
MCH: 29.1 pg (ref 27.0–33.0)
MCHC: 33.7 g/dL (ref 32.0–36.0)
MCV: 86.4 fL (ref 80.0–100.0)
MPV: 10.1 fL (ref 7.5–12.5)
Monocytes Relative: 8.5 %
Neutro Abs: 2063 cells/uL (ref 1500–7800)
Neutrophils Relative %: 43.9 %
Platelets: 244 10*3/uL (ref 140–400)
RBC: 4.78 10*6/uL (ref 4.20–5.80)
RDW: 13.7 % (ref 11.0–15.0)
Total Lymphocyte: 45.9 %
WBC: 4.7 10*3/uL (ref 3.8–10.8)

## 2020-12-17 LAB — HIV-1 RNA QUANT-NO REFLEX-BLD
HIV 1 RNA Quant: 23 Copies/mL — ABNORMAL HIGH
HIV-1 RNA Quant, Log: 1.35 Log cps/mL — ABNORMAL HIGH

## 2020-12-17 LAB — FLUORESCENT TREPONEMAL AB(FTA)-IGG-BLD: Fluorescent Treponemal ABS: REACTIVE — AB

## 2020-12-17 LAB — RPR: RPR Ser Ql: REACTIVE — AB

## 2020-12-17 NOTE — Addendum Note (Signed)
Addended by: Blanchard Kelch on: 12/17/2020 03:33 PM   Modules accepted: Orders

## 2020-12-17 NOTE — Progress Notes (Signed)
Patient still with high titer and concern for neurosyphilis (has had migraines since his initial infection with syphilis when he had neck/headaches, new onset). I would like to treat with 2 weeks of IV penicillin via PICC line.  Patient will need PICC placement (orders in).  PCN 24 million units continuous IV x 14 days.  Thank you.

## 2020-12-18 NOTE — Progress Notes (Signed)
Attempted to reach x2 via phone. Also sent MyChart message. Will continue to follow. Valarie Cones

## 2020-12-18 NOTE — Telephone Encounter (Signed)
Attempted to reach out to patient to connect with [facility]. The voicemail box is full.  Will attempt to reach out again. Valarie Cones

## 2020-12-21 ENCOUNTER — Telehealth: Payer: Self-pay | Admitting: Infectious Diseases

## 2020-12-21 NOTE — Telephone Encounter (Signed)
Attempted to call again re: plan for treating suspected neurosyphilis (asymptmoatic presently but persistently elevated titer 1:64 following very high titer with acute infection and meningitis symptoms).   He read my mychart message explaining recommendations

## 2020-12-26 ENCOUNTER — Emergency Department (HOSPITAL_COMMUNITY): Payer: Self-pay

## 2020-12-26 ENCOUNTER — Other Ambulatory Visit: Payer: Self-pay

## 2020-12-26 ENCOUNTER — Encounter (HOSPITAL_COMMUNITY): Payer: Self-pay

## 2020-12-26 ENCOUNTER — Emergency Department (HOSPITAL_COMMUNITY)
Admission: EM | Admit: 2020-12-26 | Discharge: 2020-12-26 | Discharge status: Against Medical Advice | Payer: Medicaid Other | Attending: Emergency Medicine | Admitting: Emergency Medicine

## 2020-12-26 ENCOUNTER — Encounter (HOSPITAL_COMMUNITY): Payer: Self-pay | Admitting: Emergency Medicine

## 2020-12-26 ENCOUNTER — Emergency Department (HOSPITAL_COMMUNITY)
Admission: EM | Admit: 2020-12-26 | Discharge: 2020-12-26 | Disposition: A | Discharge status: Home / Self Care | Payer: Self-pay | Attending: Emergency Medicine | Admitting: Emergency Medicine

## 2020-12-26 DIAGNOSIS — J029 Acute pharyngitis, unspecified: Secondary | ICD-10-CM | POA: Insufficient documentation

## 2020-12-26 DIAGNOSIS — J45909 Unspecified asthma, uncomplicated: Secondary | ICD-10-CM | POA: Insufficient documentation

## 2020-12-26 DIAGNOSIS — T17208A Unspecified foreign body in pharynx causing other injury, initial encounter: Secondary | ICD-10-CM | POA: Insufficient documentation

## 2020-12-26 DIAGNOSIS — Z5321 Procedure and treatment not carried out due to patient leaving prior to being seen by health care provider: Secondary | ICD-10-CM | POA: Insufficient documentation

## 2020-12-26 DIAGNOSIS — R0989 Other specified symptoms and signs involving the circulatory and respiratory systems: Secondary | ICD-10-CM

## 2020-12-26 DIAGNOSIS — Z21 Asymptomatic human immunodeficiency virus [HIV] infection status: Secondary | ICD-10-CM | POA: Insufficient documentation

## 2020-12-26 DIAGNOSIS — F17210 Nicotine dependence, cigarettes, uncomplicated: Secondary | ICD-10-CM | POA: Insufficient documentation

## 2020-12-26 DIAGNOSIS — X58XXXA Exposure to other specified factors, initial encounter: Secondary | ICD-10-CM | POA: Insufficient documentation

## 2020-12-26 MED ORDER — SUCRALFATE 1 GM/10ML PO SUSP: 1.0000 g | Freq: Three times a day (TID) | ORAL | 0 refills | Status: DC

## 2020-12-26 MED ORDER — LIDOCAINE VISCOUS HCL 2 % MT SOLN
15.0000 mL | Freq: Once | OROMUCOSAL | Status: AC
  Administered 2020-12-26: 15 mL via OROMUCOSAL
  Filled 2020-12-26: qty 15

## 2020-12-26 MED ORDER — PANTOPRAZOLE SODIUM 20 MG PO TBEC: 20.0000 mg | DELAYED_RELEASE_TABLET | Freq: Every day | ORAL | 1 refills | Status: DC

## 2020-12-26 MED ORDER — LIDOCAINE VISCOUS HCL 2 % MT SOLN: 15.0000 mL | Freq: Two times a day (BID) | OROMUCOSAL | 2 refills | Status: DC | PRN

## 2020-12-26 MED ORDER — LIDOCAINE VISCOUS HCL 2 % MT SOLN: 15.0000 mL | OROMUCOSAL | 2 refills | Status: DC | PRN

## 2020-12-26 NOTE — ED Notes (Signed)
Pt called x3 no response.  

## 2020-12-26 NOTE — ED Triage Notes (Signed)
Patient states he feels like something lodged in his throat since yesterday. Patient states he is unable to swallow his own saliva at times. Patient is talking without difficulty.  Patient states he went to [facility] ED this AM, but left due to wait times.

## 2020-12-26 NOTE — ED Notes (Signed)
Patient tolerating PO fluids 

## 2020-12-26 NOTE — ED Notes (Signed)
Unable to locate in lobby , called name multiple times

## 2020-12-26 NOTE — ED Provider Notes (Signed)
[facility]-EMERGENCY DEPT Provider Note   CSN: [id number] Arrival date & time: 12/26/20  9211     History Chief Complaint  Patient presents with  . food lodged in throat    Kelly Knight is a 25 y.o. male.  HPI      Kelly Knight is a 25 y.o. male, with a history of asthma, HIV, presenting to the ED with foreign body sensation in the throat beginning yesterday.  He states this was present yesterday morning.  He endorses pain to the throat, "when I drink something it feels like the liquid is moving around something to get down my throat." He does not know what could be stuck in the throat. Denies fever/chills, vomiting, shortness of breath, drooling, coughing, chest pain, voice abnormality, pain with speaking, or any other complaints.    Past Medical History:  Diagnosis Date  . Asthma   . Heart murmur   . HIV (human immunodeficiency virus infection) ([facility]) 01/23/2019  . Situs inversus   . Syphilis 01/23/2019    Patient Active Problem List   Diagnosis Date Noted  . Environmental allergies 07/05/2020  . Anxiety and depression 05/10/2019  . HIV (human immunodeficiency virus infection) ([facility]) 01/23/2019  . History of syphilis 01/23/2019    History reviewed. No pertinent surgical history.     Family History  Problem Relation Age of Onset  . Diabetes Mother   . Hypertension Mother   . Hypertension Father     Social History   Tobacco Use  . Smoking status: Light Tobacco Smoker    Types: Cigarettes  . Smokeless tobacco: Never Used  Vaping Use  . Vaping Use: Every day  . Substances: THC  Substance Use Topics  . Alcohol use: Yes    Comment: socially   . Drug use: Not Currently    Types: Marijuana    Home Medications Prior to Admission medications   Medication Sig Start Date End Date Taking? Authorizing Provider  acetaminophen (TYLENOL) 500 MG tablet Take 500 mg by mouth every 6 (six) hours as needed (severe migraine).   Yes  [provider]  albuterol (PROVENTIL) (2.5 MG/3ML) 0.083% nebulizer solution Take 2.5 mg by nebulization every 6 (six) hours as needed for wheezing.   Yes [provider]  albuterol (VENTOLIN HFA) 108 (90 Base) MCG/ACT inhaler Inhale 2 puffs into the lungs every 6 (six) hours as needed for wheezing or shortness of breath. 06/02/20  Yes Blanchard Kelch, NP  aspirin-acetaminophen-caffeine (EXCEDRIN MIGRAINE) [phone] MG tablet Take 2 tablets by mouth 3 (three) times daily as needed for headache or migraine.   Yes [provider]  dolutegravir (TIVICAY) 50 MG tablet TAKE 1 TABLET(50 MG) BY MOUTH DAILY Patient taking differently: Take 50 mg by mouth daily after supper. 12/14/20  Yes Blanchard Kelch, NP  emtricitabine-tenofovir AF (DESCOVY) 200-25 MG tablet Take 1 tablet by mouth daily. Patient taking differently: Take 1 tablet by mouth daily after supper. 12/14/20  Yes Blanchard Kelch, NP  pantoprazole (PROTONIX) 20 MG tablet Take 1 tablet (20 mg total) by mouth daily. 12/26/20 02/24/21 Yes Joy, Shawn C, PA-C  sucralfate (CARAFATE) 1 GM/10ML suspension Take 10 mLs (1 g total) by mouth 4 (four) times daily -  with meals and at bedtime. 12/26/20  Yes Joy, Shawn C, PA-C  lidocaine (XYLOCAINE) 2 % solution Use as directed 15 mLs in the mouth or throat 2 (two) times daily as needed (throat pain). 12/26/20   Joy, Hillard Danker,  PA-C    Allergies    Shrimp [shellfish allergy] and Sulfa antibiotics  Review of Systems   Review of Systems  Constitutional: Negative for chills, diaphoresis and fever.  HENT: Positive for trouble swallowing. Negative for drooling and voice change.   Respiratory: Negative for cough, shortness of breath and stridor.   Cardiovascular: Negative for chest pain.  Gastrointestinal: Negative for abdominal pain, nausea and vomiting.  Musculoskeletal: Negative for neck pain and neck stiffness.  Neurological: Negative for dizziness, syncope and weakness.  All  other systems reviewed and are negative.   Physical Exam Updated Vital Signs BP 133/76 (BP Location: Left Arm)   Pulse (!) 108   Temp 98.3 F (36.8 C) (Oral)   Resp 16   Ht 5\' 10"  (1.778 m)   Wt 71.2 kg   SpO2 100%   BMI 22.53 kg/m   Physical Exam Vitals and nursing note reviewed.  Constitutional:      General: He is not in acute distress.    Appearance: He is well-developed. He is not diaphoretic.  HENT:     Head: Normocephalic and atraumatic.     Mouth/Throat:     Mouth: Mucous membranes are moist.     Pharynx: Oropharynx is clear.     Comments: No noted area of intraoral swelling.  No trismus or noted abnormal phonation.  Mouth opening to at least 3 finger widths.  Handles oral secretions without difficulty.  No noted facial swelling.  No sublingual swelling or tongue elevation.  No swelling or tenderness to the submental or submandibular regions.  No swelling or tenderness into the soft tissues of the neck. Eyes:     Conjunctiva/sclera: Conjunctivae normal.  Cardiovascular:     Rate and Rhythm: Normal rate and regular rhythm.     Pulses: Normal pulses.     Heart sounds: Normal heart sounds.  Pulmonary:     Effort: Pulmonary effort is normal. No respiratory distress.     Breath sounds: Normal breath sounds. No stridor.  Abdominal:     Palpations: Abdomen is soft.     Tenderness: There is no abdominal tenderness. There is no guarding.  Musculoskeletal:     Cervical back: Normal range of motion and neck supple. No rigidity or tenderness.  Lymphadenopathy:     Cervical: No cervical adenopathy.  Skin:    General: Skin is warm and dry.  Neurological:     Mental Status: He is alert.  Psychiatric:        Mood and Affect: Mood and affect normal.        Speech: Speech normal.        Behavior: Behavior normal.     ED Results / Procedures / Treatments   Labs (all labs ordered are listed, but only abnormal results are displayed) Labs Reviewed - No data to  display  EKG None  Radiology DG Neck Soft Tissue  Result Date: 12/26/2020 CLINICAL DATA:  Foreign body sensation in throat. EXAM: NECK SOFT TISSUES - 1+ VIEW COMPARISON:  Prior cervical spine CT from 02/09/2017. FINDINGS: Just anterior and superior to the epiglottis, there is a linear density. When comparing to prior cervical spine CT, this represents a calcified stylohyoid ligament. No suspicious radiopaque foreign body. Epiglottis is normal size. Airway is patent. Retropharyngeal soft tissues are normal. IMPRESSION: No radiopaque foreign body visualized. Electronically Signed   By: 04/11/2017 M.D.   On: 12/26/2020 19:07    Procedures Procedures   Medications Ordered in ED Medications  lidocaine (  XYLOCAINE) 2 % viscous mouth solution 15 mL (15 mLs Mouth/Throat Given 12/26/20 1819)    ED Course  I have reviewed the triage vital signs and the nursing notes.  Pertinent labs & imaging results that were available during my care of the patient were reviewed by me and considered in my medical decision making (see chart for details).    MDM Rules/Calculators/A&P                          Patient presents with foreign body sensation in throat. Patient is nontoxic appearing, afebrile, not tachypneic, not hypotensive, maintains excellent SPO2 on room air, and is in no apparent distress.  No stridor.  No signs of airway involvement.  Tolerating oral secretions.  I have reviewed the patient's chart to obtain more information.   I reviewed and interpreted the patient's radiological studies. Discomfort resolved after lidocaine. GI follow-up.  The patient was given instructions for home care as well as return precautions. Patient voices understanding of these instructions, accepts the plan, and is comfortable with discharge.  Findings and plan of care discussed with attending physician, Linwood Dibbles, MD.   Vitals:   12/26/20 1642 12/26/20 1643 12/26/20 1809 12/26/20 1953  BP: 133/76  127/74  115/71  Pulse: (!) 108  98 85  Resp: 16  18 18   Temp: 98.3 F (36.8 C)     TempSrc: Oral     SpO2: 100%  98% 96%  Weight:  71.2 kg    Height:  5\' 10"  (1.778 m)       Final Clinical Impression(s) / ED Diagnoses Final diagnoses:  Foreign body sensation in throat    Rx / DC Orders ED Discharge Orders         Ordered    lidocaine (XYLOCAINE) 2 % solution  As needed,   Status:  Discontinued        12/26/20 1948    sucralfate (CARAFATE) 1 GM/10ML suspension  3 times daily with meals & bedtime        12/26/20 1948    lidocaine (XYLOCAINE) 2 % solution  2 times daily PRN        12/26/20 1950    pantoprazole (PROTONIX) 20 MG tablet  Daily        12/26/20 1952           12/28/20 12/26/20 2118    12/28/20, MD 12/27/20 1306

## 2020-12-26 NOTE — Discharge Instructions (Addendum)
  Diet: Start with a clear liquid diet, progressed to a full liquid diet, and then bland solids as you are able. Please adhere to the enclosed dietary suggestions.  In general, avoid NSAIDs (i.e. ibuprofen, naproxen, etc.), caffeine, alcohol, spicy foods, fatty foods, or any other foods that seem to cause your symptoms to arise.  Protonix: Take this medication daily, 20-30 minutes prior to your first meal, for the next 8 weeks.  Continue to take this medication even if you begin to feel better.  Sucralfate: Sucralfate (generic for Carafate) is meant to soothe symptoms of abdominal discomfort and reflux.  Lidocaine: May use the lidocaine up to twice a day for throat pain.  Follow-up: Should symptoms fail to start to improve, please follow-up with gastroenterology on this matter.  Call to make an appointment.  Return: Return to the ED for significantly worsening symptoms, persistent vomiting, persistent fever, vomiting blood, blood in the stools, dark stools, or any other major concerns.  For prescription assistance, may try using prescription discount sites or apps, such as goodrx.com

## 2020-12-26 NOTE — ED Notes (Signed)
Patient provided with water for PO challenge.

## 2020-12-26 NOTE — ED Triage Notes (Signed)
C/o sore throat since yesterday.  States pain better after eating yesterday and started again at 2am.  Denies fever and chills.

## 2021-01-11 ENCOUNTER — Other Ambulatory Visit: Payer: Self-pay | Admitting: Infectious Diseases

## 2021-01-11 DIAGNOSIS — B20 Human immunodeficiency virus [HIV] disease: Secondary | ICD-10-CM

## 2021-01-18 ENCOUNTER — Telehealth: Payer: Self-pay | Admitting: Infectious Diseases

## 2021-01-18 MED ORDER — OMEPRAZOLE 20 MG PO CPDR: 20.0000 mg | DELAYED_RELEASE_CAPSULE | Freq: Every day | ORAL | 1 refills | Status: DC

## 2021-01-18 NOTE — Telephone Encounter (Signed)
Spoke with Shraga - his ex boyfriend found him again and he had to relocate recently. Lawsuit/restraining order in process. He is now staying with his mother in [place] and will be expected to be here for several weeks until he can resolve something more permanent.   Will have him come Thursday at 2:30 so we can talk more about picc line, ER findings recently and plan for treatment regarding neurosyphilis.   He is aware of the appointment and will be here.

## 2021-01-21 ENCOUNTER — Encounter: Payer: Self-pay | Admitting: Infectious Diseases

## 2021-01-21 ENCOUNTER — Telehealth: Payer: Self-pay

## 2021-01-21 ENCOUNTER — Other Ambulatory Visit (HOSPITAL_COMMUNITY): Payer: Self-pay

## 2021-01-21 ENCOUNTER — Other Ambulatory Visit: Payer: Self-pay

## 2021-01-21 ENCOUNTER — Ambulatory Visit (INDEPENDENT_AMBULATORY_CARE_PROVIDER_SITE_OTHER): Payer: Self-pay | Admitting: Infectious Diseases

## 2021-01-21 VITALS — BP 130/75 | HR 82 | Temp 98.5°F | Ht 70.0 in | Wt 146.0 lb

## 2021-01-21 DIAGNOSIS — Z21 Asymptomatic human immunodeficiency virus [HIV] infection status: Secondary | ICD-10-CM

## 2021-01-21 DIAGNOSIS — Z8619 Personal history of other infectious and parasitic diseases: Secondary | ICD-10-CM

## 2021-01-21 DIAGNOSIS — Z452 Encounter for adjustment and management of vascular access device: Secondary | ICD-10-CM

## 2021-01-21 DIAGNOSIS — K2100 Gastro-esophageal reflux disease with esophagitis, without bleeding: Secondary | ICD-10-CM | POA: Insufficient documentation

## 2021-01-21 DIAGNOSIS — F32A Depression, unspecified: Secondary | ICD-10-CM

## 2021-01-21 DIAGNOSIS — F419 Anxiety disorder, unspecified: Secondary | ICD-10-CM

## 2021-01-21 MED ORDER — OMEPRAZOLE 20 MG PO CPDR
DELAYED_RELEASE_CAPSULE | ORAL | 0 refills | Status: DC
Start: 2021-01-21 — End: 2021-05-21

## 2021-01-21 NOTE — Assessment & Plan Note (Signed)
He is interested in converting to [place].  We will have Ileene Hutchinson with to provide this for him in the next 3 to 4 months.  He will for now continue crushable Tivicay and Descovy.  Viral load undetectable in March and CD4 count remains at new nadir between 300 and 400.  HIV 1 RNA Quant  Date Value  12/14/2020 23 Copies/mL (H)  06/02/2020 78 Copies/mL (H)  02/18/2020 <20 DETECTED copies/mL (A)   CD4 T Cell Abs (/uL)  Date Value  12/14/2020 389 (L)  06/02/2020 304 (L)  02/18/2020 296 (L)

## 2021-01-21 NOTE — Assessment & Plan Note (Signed)
He was evaluated recently in the ER with foreign body sensation and reflux symptoms.  No vomiting but just has frequent episodes of heartburn multiple times throughout the day.  This is improved since initiating once daily omeprazole in the morning.  We will add a p.m. dose before dinner x2 weeks then resume daily for an additional 6 weeks.  Soft tissue x-rays reveal a calcified stylohyoid ligament.  Unclear if he would benefit from a surgery or other medical care.  GI versus ENT?  Given reflux symptoms we discussed meeting with GI first at his discretion.  May need to wait until he has insurance available to him.
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
## 2021-01-21 NOTE — Telephone Encounter (Signed)
RN spoke with Jeri Modena regarding IV penicillin for the patient. She states that private pay will come out to about $54 a day, which can be financed for $60 a month over a year. Patient will not have access to home health and will need to come into the office for labs and dressing changes. Pam states that she will meet the patient at the hospital for his PICC appointment and provide education. Supplies will include pump, tubing, flushes, medication, and dressing supplies.   RN left voicemail for IR requesting callback to schedule PICC appointment. Patient prefers afternoons, but cannot do 02/01/21 at all.   Nurse visit will need to be scheduled 1 week after PICC placement for labs and dressing change.   Per Rexene Alberts, NP patient will not need to go to short stay as he has been treated with penicillin previously. RN made Pam aware.   Orders faxed to [facility].   Sandie Ano, RN

## 2021-01-21 NOTE — Assessment & Plan Note (Signed)
We will had a very high titer with most recent infection due to syphilis 1: 2056.  He has been serofast at 1: 64 for a while now.  He has had intermittent migraines since and had meningitis-like symptoms at initial infection.  I worry he has persistent infection and titer due to neurosyphilis and incomplete treatment.  We discussed options regarding presumptive treatment with 2 weeks of IV penicillin versus going the route of doing a lumbar puncture.  He has no signs of elevated ICP.  Unable to do charity care outpatient and he is now need to be in the hospital for care at this time.  We have been working with home health on private pay with a payment plan.    He states this is a doable option to treat presumptively and forego the LP.  We will go ahead and figure out what the cost may be associated with PICC line placement in IR and proceed with scheduling.  This can also be arranged in a payment plan and we can help with [facility] health financial application as well.  OPAT ORDERS:  Diagnosis: presumed neurosyphilis   Allergies  Allergen Reactions  . Shrimp [Shellfish Allergy] Itching and Swelling    Throat swelling  . Sulfa Antibiotics Rash     Discharge antibiotics to be given via PICC line:  IV PENICILLIN 24,000 UNITS via continuous infusion pump    Duration: 14 days   End Date: TBD   [facility] Care Per Protocol with Biopatch Use: Home health liaison will meet him in IR to help provide teaching regarding daily maintenance of infusion/line. We will have him in ID clinic weekly for dressing changes and safety labs.   Labs weekly while on IV antibiotics: _x_ CBC with differential _x_ BMP __ CMP __ CRP __ ESR __ Vancomycin trough __ CK  _x_ Please pull PIC at completion of IV antibiotics __ Please leave PIC in place until doctor has seen patient or been notified    Follow-up will be 1 week after we start his treatment. Follow titers again every 3 months to assess for changes.

## 2021-01-21 NOTE — Patient Instructions (Signed)
We will work on the treatment for the IV antibiotic and PICC line  Please see below about care and what to expect. We will help you with this too.   Will see you back weekly while the IV is in place so we can help with labs and dressing care.  Please follow up with janet as well <3    PICC Home Care Guide A peripherally inserted central catheter (PICC) is a form of IV access that allows medicines and IV fluids to be quickly distributed throughout the body. The PICC is a long, thin, flexible tube (catheter) that is inserted into a vein in the upper arm. The catheter ends in a large vein in the chest (superior vena cava, or SVC). After the PICC is inserted, a chest X-ray may be done to make sure that it is in the correct place. A PICC may be placed for different reasons, such as:  To give medicines and liquid nutrition.  To give IV fluids and blood products.  If there is trouble placing a peripheral intravenous (PIV) catheter. If taken care of properly, a PICC can remain in place for several months. Having a PICC can also allow a person to go home from the hospital sooner. Medicine and PICC care can be managed at home by a family member, caregiver, or home health care team.  What are the risks? Generally, having a PICC is safe. However, problems may occur, including:  A blood clot (thrombus) forming in or at the tip of the PICC.  A blood clot forming in a vein (deep vein thrombosis) or traveling to the lung (pulmonary embolism).  Inflammation of the vein (phlebitis) in which the PICC is placed.  Infection. Central line associated blood stream infection (CLABSI) is a serious infection that often requires hospitalization.  PICC movement (malposition). The PICC tip may move from its original position due to excessive physical activity, forceful coughing, sneezing, or vomiting.  A break or cut in the PICC. It is important not to use scissors near the PICC.  Nerve or tendon irritation or  injury during PICC insertion. How to take care of your PICC Preventing problems  You and any caregivers should wash your hands often with soap. Wash hands: ? Before touching the PICC line or the infusion device. ? Before changing a bandage (dressing).  Flush the PICC as told by your health care provider. Let your health care provider know right away if the PICC is hard to flush or does not flush. Do not use force to flush the PICC.  Do not use a syringe that is less than 10 mL to flush the PICC.  Avoid blood pressure checks on the arm in which the PICC is placed.  Never pull or tug on the PICC.  Do not take the PICC out yourself. Only a trained clinical professional should remove the PICC.  Use clean and sterile supplies only. Keep the supplies in a dry place. Do not reuse needles, syringes, or any other supplies. Doing that can lead to infection.  Keep pets and children away from your PICC line.  Check the PICC insertion site every day for signs of infection. Check for: ? Leakage. ? Redness, swelling, or pain. ? Fluid or blood. ? Warmth. ? Pus or a bad smell.  PICC dressing care  Keep your PICC bandage (dressing) clean and dry to prevent infection.  Do not take baths, swim, or use a hot tub until your health care provider approves. Ask your  health care provider if you can take showers. You may only be allowed to take sponge baths for bathing. When you are allowed to shower: ? Ask your health care provider to teach you how to wrap the PICC line. ? Cover the PICC line with clear plastic wrap and tape to keep it dry while showering.  Follow instructions from your health care provider about how to take care of your insertion site and dressing. Make sure you: ? Wash your hands with soap and water before you change your bandage (dressing). If soap and water are not available, use hand sanitizer. ? Change your dressing as told by your health care provider. ? Leave stitches  (sutures), skin glue, or adhesive strips in place. These skin closures may need to stay in place for 2 weeks or longer. If adhesive strip edges start to loosen and curl up, you may trim the loose edges. Do not remove adhesive strips completely unless your health care provider tells you to do that.  Change your PICC dressing if it becomes loose or wet.  General instructions  Carry your PICC identification card or wear a medical alert bracelet at all times.  Keep the tube clamped at all times, unless it is being used.  Carry a smooth-edge clamp with you at all times to place on the tube if it breaks.  Do not use scissors or sharp objects near the tube.  You may bend your arm and move it freely. If your PICC is near or at the bend of your elbow, avoid activity with repeated motion at the elbow.  Avoid lifting heavy objects as told by your health care provider.  Keep all follow-up visits as told by your health care provider. This is important.    Disposal of supplies  Throw away any syringes in a disposal container that is meant for sharp items (sharps container). You can buy a sharps container from a pharmacy, or you can make one by using an empty hard plastic bottle with a cover.  Place any used dressings or infusion bags into a plastic bag. Throw that bag in the trash.   Contact a health care provider if:  You have pain in your arm, ear, face, or teeth.  You have a fever or chills.  You have redness, swelling, or pain around the insertion site.  You have fluid or blood coming from the insertion site.  Your insertion site feels warm to the touch.  You have pus or a bad smell coming from the insertion site.  Your skin feels hard and raised around the insertion site. Get help right away if:  Your PICC is accidentally pulled all the way out. If this happens, cover the insertion site with a bandage or gauze dressing. Do not throw the PICC away. Your health care provider will  need to check it.  Your PICC was tugged or pulled and has partially come out. Do not  push the PICC back in.  You cannot flush the PICC, it is hard to flush, or the PICC leaks around the insertion site when it is flushed.  You hear a "flushing" sound when the PICC is flushed.  You feel your heart racing or skipping beats.  There is a hole or tear in the PICC.  You have swelling in the arm in which the PICC was inserted.  You have a red streak going up your arm from where the PICC was inserted. Summary  A peripherally inserted central catheter (PICC)  is a long, thin, flexible tube (catheter) that is inserted into a vein in the upper arm.  The PICC is inserted using a sterile technique by a specially trained nurse or physician. Only a trained clinical professional should remove it.  Keep your PICC identification card with you at all times.  Avoid blood pressure checks on the arm in which the PICC is placed.  If cared for properly, a PICC can remain in place for several months. Having a PICC can also allow a person to go home from the hospital sooner. This information is not intended to replace advice given to you by your health care provider. Make sure you discuss any questions you have with your health care provider. Document Revised: 02/05/2020 Document Reviewed: 02/05/2020 Elsevier Patient Education  2021 [employer].

## 2021-01-21 NOTE — Assessment & Plan Note (Signed)
Acutely worsened due to current stressors surrounding an expartner.  Safety has been secured.  We will have him resume some counseling sessions with Marylu Lund to help work through current struggles.

## 2021-01-25 ENCOUNTER — Other Ambulatory Visit: Payer: Self-pay

## 2021-01-25 DIAGNOSIS — A539 Syphilis, unspecified: Secondary | ICD-10-CM

## 2021-01-25 DIAGNOSIS — Z452 Encounter for adjustment and management of vascular access device: Secondary | ICD-10-CM

## 2021-01-25 NOTE — Telephone Encounter (Signed)
RN spoke with patient to relay PICC appointment time/location, patient agreeable. Also relayed to patient that he will follow up in our office 1 week from PICC placement for labs and dressing change.   RN relayed PICC appointment time to Jeri Modena with [facility], she states she will meet the patient there for education.   RN sent MyChart message to patient detailing appointment information.   Sandie Ano, RN

## 2021-01-27 NOTE — Telephone Encounter (Signed)
Debbie with [facility] called in regards to patient dose for IV penicillin. The orders that they received have 24,000 Units. Eunice Blase was calling to confirm. Per Judeth Cornfield it should be 24 Million Units. Verbal orders given to [facility] with [facility] for IV penicillin dose. Melissa verbalized understanding.  Chief Walkup T Pricilla Loveless

## 2021-01-28 ENCOUNTER — Other Ambulatory Visit: Payer: Self-pay

## 2021-01-28 ENCOUNTER — Ambulatory Visit (HOSPITAL_COMMUNITY)
Admission: RE | Admit: 2021-01-28 | Discharge: 2021-01-28 | Disposition: A | Discharge status: Home / Self Care | Payer: Medicaid Other | Source: Ambulatory Visit | Attending: Infectious Diseases | Admitting: Infectious Diseases

## 2021-01-28 ENCOUNTER — Other Ambulatory Visit: Payer: Self-pay | Admitting: Infectious Diseases

## 2021-01-28 DIAGNOSIS — Z8619 Personal history of other infectious and parasitic diseases: Secondary | ICD-10-CM | POA: Insufficient documentation

## 2021-01-28 DIAGNOSIS — Z452 Encounter for adjustment and management of vascular access device: Secondary | ICD-10-CM | POA: Insufficient documentation

## 2021-01-28 MED ORDER — LIDOCAINE HCL 1 % IJ SOLN
INTRAMUSCULAR | Status: AC
  Administered 2021-01-28: 5 mL via SUBCUTANEOUS
  Filled 2021-01-28: qty 20

## 2021-01-28 MED ORDER — HEPARIN SOD (PORK) LOCK FLUSH 100 UNIT/ML IV SOLN
INTRAVENOUS | Status: AC
  Administered 2021-01-28: 5 [IU] via INTRAVENOUS
  Filled 2021-01-28: qty 5

## 2021-01-28 NOTE — Telephone Encounter (Signed)
Received call from Jeri Modena, she states she went to [facility] IR to meet the patient for PICC education, but that he had already left. She is having his medication and supplies delivered to his home today. Pam states the supplies will come with a QR code showing instructions for infusion and set up along with pictures.   RN attempted to call patient, no answer.   Sandie Ano, RN

## 2021-01-28 NOTE — Procedures (Signed)
PROCEDURE SUMMARY:  Successful placement of image-guided single lumen PICC line to the right basilic vein. Length 33 cm. Tip at lower SVC/RA. No complications. EBL = < 2 ml. Ready for use.  Please see imaging section of Epic for full dictation.   Mickie Kay, NP 01/28/2021 3:19 PM

## 2021-01-29 ENCOUNTER — Telehealth: Payer: Self-pay | Admitting: *Deleted

## 2021-01-29 NOTE — Telephone Encounter (Signed)
Patient has questions about how to administer IV penicillin. He did not meet with Pam before leaving after PICC was placed 4/21. He is uninsured, has Chief Financial Officer charity care and will have PICC dressing changes/labs at [facility] weekly.  He will need to bring his PICC supplies to the nurse visit 4/28. RN confirmed patient's phone numbers, emergency contact (removed incorrect phone number from his chart). RN spoke with Elita Quick. She will call patient to teach antibiotic administration and PICC teaching; will remind him to bring his PICC supplies 4/28. Andree Coss, RN

## 2021-02-01 NOTE — Telephone Encounter (Signed)
RN attempted to call patient to further assess symptoms. No answer, will send MyChart message and notify provider.   Sandie Ano, RN

## 2021-02-01 NOTE — Telephone Encounter (Signed)
Patient returning call, he states that the numbness and tingling he was experiencing has completely resolved. He now reports chest pain, especially in the morning around the 24 hour mark of his infusion. He states that he also has pain in his right arm under the collar bone area. He reports a sharp pain sometimes when he coughs.Denies any redness, swelling, or shortness of breath. He states he does not believe the PICC line has moved or come out at all.   RN spoke with Marcos Eke, NP who recommends patient go to an urgent care to have the PICC evaluated and for a chest xray. Patient states he will be able to go to an urgent care today and that he will update [place] after his visit.   Sandie Ano, RN

## 2021-02-01 NOTE — Telephone Encounter (Signed)
Agree with CXR to evaluate position. Thank you!

## 2021-02-02 NOTE — Telephone Encounter (Signed)
Attempted to call patient to assess if he was still having symptoms and to see if he was able to go to urgent care. No answer and mailbox was full.   Sandie Ano, RN

## 2021-02-04 ENCOUNTER — Ambulatory Visit: Payer: Self-pay

## 2021-02-04 ENCOUNTER — Other Ambulatory Visit: Payer: Self-pay

## 2021-02-04 DIAGNOSIS — A539 Syphilis, unspecified: Secondary | ICD-10-CM

## 2021-02-04 DIAGNOSIS — B20 Human immunodeficiency virus [HIV] disease: Secondary | ICD-10-CM

## 2021-02-04 DIAGNOSIS — Z452 Encounter for adjustment and management of vascular access device: Secondary | ICD-10-CM

## 2021-02-04 NOTE — Addendum Note (Signed)
Addended byJimmy Picket F on: 02/04/2021 03:45 PM   Modules accepted: Orders

## 2021-02-04 NOTE — Progress Notes (Signed)
Patient arrived to [facility] for PICC dressing change. PICC site is clean, dry and intact. No drainage noted. Biopatch present. Dressing changed using sterile procedure; chloraprep applied. New biopatch applied as well as securement device. Labs drawn per order and line flushed with NS and heparin. Patient will return to clinic in 7 days for change/PICC pull and labs if needed.   Nelsie Domino Loyola Mast, RN

## 2021-02-05 LAB — SEDIMENTATION RATE: Sed Rate: 25 mm/h — ABNORMAL HIGH (ref 0–15)

## 2021-02-05 LAB — BASIC METABOLIC PANEL
BUN: 15 mg/dL (ref 7–25)
CO2: 25 mmol/L (ref 20–32)
Calcium: 9.3 mg/dL (ref 8.6–10.3)
Chloride: 104 mmol/L (ref 98–110)
Creat: 1.23 mg/dL (ref 0.60–1.35)
Glucose, Bld: 96 mg/dL (ref 65–99)
Potassium: 3.8 mmol/L (ref 3.5–5.3)
Sodium: 137 mmol/L (ref 135–146)

## 2021-02-05 LAB — CBC WITH DIFFERENTIAL/PLATELET
Absolute Monocytes: 363 cells/uL (ref 200–950)
Basophils Absolute: 29 cells/uL (ref 0–200)
Basophils Relative: 0.6 %
Eosinophils Absolute: 191 cells/uL (ref 15–500)
Eosinophils Relative: 3.9 %
HCT: 40.3 % (ref 38.5–50.0)
Hemoglobin: 13.8 g/dL (ref 13.2–17.1)
Lymphs Abs: 2754 cells/uL (ref 850–3900)
MCH: 29.6 pg (ref 27.0–33.0)
MCHC: 34.2 g/dL (ref 32.0–36.0)
MCV: 86.5 fL (ref 80.0–100.0)
MPV: 10.4 fL (ref 7.5–12.5)
Monocytes Relative: 7.4 %
Neutro Abs: 1563 cells/uL (ref 1500–7800)
Neutrophils Relative %: 31.9 %
Platelets: 186 10*3/uL (ref 140–400)
RBC: 4.66 10*6/uL (ref 4.20–5.80)
RDW: 13.9 % (ref 11.0–15.0)
Total Lymphocyte: 56.2 %
WBC: 4.9 10*3/uL (ref 3.8–10.8)

## 2021-02-05 LAB — C-REACTIVE PROTEIN: CRP: 4.2 mg/L (ref <8.0)

## 2021-02-11 ENCOUNTER — Telehealth: Payer: Self-pay

## 2021-02-11 ENCOUNTER — Ambulatory Visit (INDEPENDENT_AMBULATORY_CARE_PROVIDER_SITE_OTHER): Payer: Self-pay | Admitting: *Deleted

## 2021-02-11 ENCOUNTER — Other Ambulatory Visit: Payer: Self-pay

## 2021-02-11 DIAGNOSIS — A539 Syphilis, unspecified: Secondary | ICD-10-CM

## 2021-02-11 MED ORDER — PENICILLIN G BENZATHINE 1200000 UNIT/2ML IM SUSY
1.2000 10*6.[IU] | PREFILLED_SYRINGE | Freq: Once | INTRAMUSCULAR | Status: AC
  Administered 2021-02-11: 1.2 10*6.[IU] via INTRAMUSCULAR

## 2021-02-11 NOTE — Telephone Encounter (Signed)
Received verbal orders per Rexene Alberts, NP to pull PICC at today's nurse visit (02/11/21). Per Judeth Cornfield, no need for labs at this time.   Sandie Ano, RN

## 2021-02-11 NOTE — Progress Notes (Signed)
Per verbal order from Rexene Alberts, FNP, 33 cm Single Lumen Peripherally Inserted Central Catheter removed from upper right arm, tip intact. No sutures present. RN confirmed length per chart. Dressing was clean and dry. Petroleum dressing applied. Pt advised no heavy lifting with this arm, leave dressing for 24 hours and call the office or seek emergent care if dressing becomes soaked with blood, swelling, or sharp pain presents. Patient verbalized understanding and agreement.  Patient's questions answered to their satisfaction. Patient tolerated procedure well, RN walked patient to check out. Pharmacy notified.  Also per verbal order from [place], [place] administered 2.4 million units bicillin IM. Patient's questions answered to their satisfaction. Andree Coss, RN

## 2021-04-16 ENCOUNTER — Telehealth: Payer: Self-pay

## 2021-04-16 NOTE — Telephone Encounter (Signed)
Kelly Knight wanting to check on patient to see how he's doing and possibly offer earlier appointment for follow up if patient wants. Would also like to see if he would be interested in an appointment with Marylu Lund.   Called patient, no answer an mailbox is full. Will send MyChart message.   Sandie Ano, RN

## 2021-05-12 NOTE — Telephone Encounter (Signed)
Patient returned missed call from RN. Informed patient that RN attempted to schedule appointment for today, but time slot has passed. Informed him that there are no open appointment for the rest of the week. Requested he either visit local urgent care for evaluation or try to establish a PCP for further management.  Provided patient with number for Internal Medicine. Juanita Laster, RMA

## 2021-05-12 NOTE — Telephone Encounter (Signed)
Called patient to offer appointment, no answer.   Sandie Ano, RN

## 2021-05-13 ENCOUNTER — Emergency Department (HOSPITAL_BASED_OUTPATIENT_CLINIC_OR_DEPARTMENT_OTHER)
Admission: EM | Admit: 2021-05-13 | Discharge: 2021-05-13 | Disposition: A | Discharge status: Home / Self Care | Payer: Self-pay | Attending: Emergency Medicine | Admitting: Emergency Medicine

## 2021-05-13 ENCOUNTER — Encounter (HOSPITAL_BASED_OUTPATIENT_CLINIC_OR_DEPARTMENT_OTHER): Payer: Self-pay | Admitting: Emergency Medicine

## 2021-05-13 ENCOUNTER — Emergency Department (HOSPITAL_BASED_OUTPATIENT_CLINIC_OR_DEPARTMENT_OTHER): Payer: Self-pay

## 2021-05-13 ENCOUNTER — Other Ambulatory Visit: Payer: Self-pay

## 2021-05-13 DIAGNOSIS — Z79899 Other long term (current) drug therapy: Secondary | ICD-10-CM | POA: Insufficient documentation

## 2021-05-13 DIAGNOSIS — F419 Anxiety disorder, unspecified: Secondary | ICD-10-CM | POA: Insufficient documentation

## 2021-05-13 DIAGNOSIS — K219 Gastro-esophageal reflux disease without esophagitis: Secondary | ICD-10-CM | POA: Insufficient documentation

## 2021-05-13 DIAGNOSIS — F17210 Nicotine dependence, cigarettes, uncomplicated: Secondary | ICD-10-CM | POA: Insufficient documentation

## 2021-05-13 DIAGNOSIS — Z21 Asymptomatic human immunodeficiency virus [HIV] infection status: Secondary | ICD-10-CM | POA: Insufficient documentation

## 2021-05-13 DIAGNOSIS — J45909 Unspecified asthma, uncomplicated: Secondary | ICD-10-CM | POA: Insufficient documentation

## 2021-05-13 LAB — COMPREHENSIVE METABOLIC PANEL
ALT: 15 U/L (ref 0–44)
AST: 17 U/L (ref 15–41)
Albumin: 4.9 g/dL (ref 3.5–5.0)
Alkaline Phosphatase: 73 U/L (ref 38–126)
Anion gap: 10 (ref 5–15)
BUN: 15 mg/dL (ref 6–20)
CO2: 25 mmol/L (ref 22–32)
Calcium: 9.8 mg/dL (ref 8.9–10.3)
Chloride: 100 mmol/L (ref 98–111)
Creatinine, Ser: 1.15 mg/dL (ref 0.61–1.24)
GFR, Estimated: >60 mL/min (ref >60)
Glucose, Bld: 104 mg/dL — ABNORMAL HIGH (ref 70–99)
Potassium: 3.4 mmol/L — ABNORMAL LOW (ref 3.5–5.1)
Sodium: 135 mmol/L (ref 135–145)
Total Bilirubin: 0.5 mg/dL (ref 0.3–1.2)
Total Protein: 9 g/dL — ABNORMAL HIGH (ref 6.5–8.1)

## 2021-05-13 LAB — CBC WITH DIFFERENTIAL/PLATELET
Abs Immature Granulocytes: 0.01 10*3/uL (ref 0.00–0.07)
Basophils Absolute: 0 10*3/uL (ref 0.0–0.1)
Basophils Relative: 0 %
Eosinophils Absolute: 0 10*3/uL (ref 0.0–0.5)
Eosinophils Relative: 0 %
HCT: 44.4 % (ref 39.0–52.0)
Hemoglobin: 15.3 g/dL (ref 13.0–17.0)
Immature Granulocytes: 0 %
Lymphocytes Relative: 21 %
Lymphs Abs: 1.2 10*3/uL (ref 0.7–4.0)
MCH: 29.8 pg (ref 26.0–34.0)
MCHC: 34.5 g/dL (ref 30.0–36.0)
MCV: 86.5 fL (ref 80.0–100.0)
Monocytes Absolute: 0.6 10*3/uL (ref 0.1–1.0)
Monocytes Relative: 10 %
Neutro Abs: 3.8 10*3/uL (ref 1.7–7.7)
Neutrophils Relative %: 69 %
Platelets: 222 10*3/uL (ref 150–400)
RBC: 5.13 MIL/uL (ref 4.22–5.81)
RDW: 13.7 % (ref 11.5–15.5)
WBC: 5.6 10*3/uL (ref 4.0–10.5)
nRBC: 0 % (ref 0.0–0.2)

## 2021-05-13 LAB — LIPASE, BLOOD: Lipase: 11 U/L (ref 11–51)

## 2021-05-13 MED ORDER — LIDOCAINE VISCOUS HCL 2 % MT SOLN
15.0000 mL | Freq: Once | OROMUCOSAL | Status: AC
  Administered 2021-05-13: 15 mL via ORAL
  Filled 2021-05-13: qty 15

## 2021-05-13 MED ORDER — ALUM & MAG HYDROXIDE-SIMETH 200-200-20 MG/5ML PO SUSP
30.0000 mL | Freq: Once | ORAL | Status: AC
  Administered 2021-05-13: 30 mL via ORAL
  Filled 2021-05-13: qty 30

## 2021-05-13 MED ORDER — LACTATED RINGERS IV BOLUS
1000.0000 mL | Freq: Once | INTRAVENOUS | Status: AC
  Administered 2021-05-13: 1000 mL via INTRAVENOUS

## 2021-05-13 MED ORDER — IOHEXOL 300 MG/ML  SOLN
100.0000 mL | Freq: Once | INTRAMUSCULAR | Status: AC | PRN
  Administered 2021-05-13: 80 mL via INTRAVENOUS

## 2021-05-13 NOTE — ED Provider Notes (Signed)
[facility] EMERGENCY DEPT Provider Note   CSN: [id number] Arrival date & time: 05/13/21  2034     History Chief Complaint  Patient presents with   Gastroesophageal Reflux   Nausea   Anxiety    Kelly Knight is a 25 y.o. male.  HPI 25 year old male history of HIV, syphilis, on return antiretroviral therapy with presents today complaining of nausea vomiting and abdominal burning.  He states he has had this in the past.  This episode started 2 days ago after eating cookout.  He reports being seen in the [facility], ED several months ago and being diagnosed with GERD.  He states that he tried to get an appointment with the gastroenterologist but was unable to.     Past Medical History:  Diagnosis Date   Asthma    Heart murmur    HIV (human immunodeficiency virus infection) (HCC) 01/23/2019   Situs inversus    Syphilis 01/23/2019    Patient Active Problem List   Diagnosis Date Noted   GERD with esophagitis 01/21/2021   Environmental allergies 07/05/2020   Anxiety and depression 05/10/2019   HIV (human immunodeficiency virus infection) (HCC) 01/23/2019   History of syphilis 01/23/2019    History reviewed. No pertinent surgical history.     Family History  Problem Relation Age of Onset   Diabetes Mother    Hypertension Mother    Hypertension Father     Social History   Tobacco Use   Smoking status: Light Smoker    Types: Cigarettes   Smokeless tobacco: Never   Tobacco comments:    and vapes, working on quitting   Vaping Use   Vaping Use: Former   Substances: THC  Substance Use Topics   Alcohol use: Not Currently    Comment: socially    Drug use: Not Currently    Frequency: 7.0 times per week    Types: Marijuana    Home Medications Prior to Admission medications   Medication Sig Start Date End Date Taking? Authorizing Provider  acetaminophen (TYLENOL) 500 MG tablet Take 500 mg by mouth every 6 (six) hours as needed (severe migraine).     [provider]  albuterol (PROVENTIL) (2.5 MG/3ML) 0.083% nebulizer solution Take 2.5 mg by nebulization every 6 (six) hours as needed for wheezing.    [provider]  albuterol (VENTOLIN HFA) 108 (90 Base) MCG/ACT inhaler Inhale 2 puffs into the lungs every 6 (six) hours as needed for wheezing or shortness of breath. 06/02/20   Blanchard Kelch, NP  aspirin-acetaminophen-caffeine (EXCEDRIN MIGRAINE) [phone] MG tablet Take 2 tablets by mouth 3 (three) times daily as needed for headache or migraine.    [provider]  dolutegravir (TIVICAY) 50 MG tablet TAKE 1 TABLET(50 MG) BY MOUTH DAILY Patient taking differently: Take 50 mg by mouth daily after supper. 12/14/20   Blanchard Kelch, NP  emtricitabine-tenofovir AF (DESCOVY) 200-25 MG tablet Take 1 tablet by mouth daily. Patient taking differently: Take 1 tablet by mouth daily after supper. 12/14/20   Blanchard Kelch, NP  lidocaine (XYLOCAINE) 2 % solution Use as directed 15 mLs in the mouth or throat 2 (two) times daily as needed (throat pain). Patient not taking: Reported on 01/21/2021 12/26/20   Anselm Pancoast, PA-C  omeprazole (PRILOSEC) 20 MG capsule Take 1 capsule (20 mg total) by mouth 2 (two) times daily before a meal for 14 days, THEN 1 capsule (20 mg total) daily before breakfast. 01/21/21 03/06/21  Rexene Alberts  N, NP    Allergies    Shrimp [shellfish allergy] and Sulfa antibiotics  Review of Systems   Review of Systems  All other systems reviewed and are negative.  Physical Exam Updated Vital Signs BP (!) 135/93 (BP Location: Right Arm)   Pulse 86   Temp 98.9 F (37.2 C) (Oral)   Resp 16   Ht 1.753 m (5\' 9" )   Wt 66.2 kg   SpO2 100%   BMI 21.56 kg/m   Physical Exam  ED Results / Procedures / Treatments   Labs (all labs ordered are listed, but only abnormal results are displayed) Labs Reviewed  COMPREHENSIVE METABOLIC PANEL - Abnormal; Notable for the following components:      Result  Value   Potassium 3.4 (*)    Glucose, Bld 104 (*)    Total Protein 9.0 (*)    All other components within normal limits  LIPASE, BLOOD  CBC WITH DIFFERENTIAL/PLATELET    EKG None  Radiology CT ABDOMEN PELVIS W CONTRAST  Result Date: 05/13/2021 CLINICAL DATA:  Abdominal pain. EXAM: CT ABDOMEN AND PELVIS WITH CONTRAST TECHNIQUE: Multidetector CT imaging of the abdomen and pelvis was performed using the standard protocol following bolus administration of intravenous contrast. CONTRAST:  57mL OMNIPAQUE IOHEXOL 300 MG/ML  SOLN COMPARISON:  CT abdomen pelvis report dated 04/27/2009. The images are not available for direct comparison. FINDINGS: Lower chest: The visualized lung bases are clear. No intra-abdominal free air or free fluid. There is near imaging of the inter abdominal organs consistent with provided history of situs inversus. Hepatobiliary: Faint 2 cm hypodense focus in the anterior liver is not characterized but likely represents a focal area of fatty infiltration or differential perfusion. The liver is otherwise unremarkable. No intrahepatic biliary ductal dilatation. The gallbladder is unremarkable. Pancreas: The pancreas is suboptimally visualized. No inflammatory changes noted. Spleen: Multiple splenic tissues noted in the right upper abdomen. Adrenals/Urinary Tract: The adrenal glands are grossly unremarkable as visualized. There is a 2 cm left renal inferior pole cyst. There is no hydronephrosis on either side. The visualized ureters and the urinary bladder appear unremarkable. Stomach/Bowel: There is no bowel obstruction or active inflammation. There is mild dilatation of the bowel. The appendix is normal. Vascular/Lymphatic: The abdominal aorta and IVC unremarkable. No portal venous gas. There is no adenopathy. Reproductive: The prostate and seminal vesicles are grossly unremarkable. No pelvic mass. Other: None Musculoskeletal: No acute or significant osseous findings. IMPRESSION: 1. No  acute intra-abdominal or pelvic pathology. 2. Situs inversus. Electronically Signed   By: 04/29/2009 M.D.   On: 05/13/2021 23:01    Procedures Procedures   Medications Ordered in ED Medications  lactated ringers bolus 1,000 mL (1,000 mLs Intravenous New Bag/Given 05/13/21 2150)  alum & mag hydroxide-simeth (MAALOX/MYLANTA) 200-200-20 MG/5ML suspension 30 mL (30 mLs Oral Given 05/13/21 2135)    And  lidocaine (XYLOCAINE) 2 % viscous mouth solution 15 mL (15 mLs Oral Given 05/13/21 2135)  iohexol (OMNIPAQUE) 300 MG/ML solution 100 mL (80 mLs Intravenous Contrast Given 05/13/21 2237)    ED Course  I have reviewed the triage vital signs and the nursing notes.  Pertinent labs & imaging results that were available during my care of the patient were reviewed by me and considered in my medical decision making (see chart for details).  Imaging reviewed no evidence of acute abnormality.  Labs reviewed. Patient feels improved.  He does voice concerns regarding his anxiety.  He denies any suicidal homicidal thoughts or  plans currently.   Plan outpatient follow-up with GI referral given Patient given information regarding the behavioral health urgent care for outpatient follow-up MDM Rules/Calculators/A&P                            Final Clinical Impression(s) / ED Diagnoses Final diagnoses:  Gastroesophageal reflux disease, unspecified whether esophagitis present  Anxiety    Rx / DC Orders ED Discharge Orders     None        Margarita Grizzle, MD 05/14/21 1501

## 2021-05-13 NOTE — ED Triage Notes (Signed)
Pt via pov from home with GERD symptoms x 2 months. Pt states that he has had increased GERD that interferes with his eating. He has anxiety that has affected his ability to eat. He states he is so anxious about the pain that he is afraid to eat or lie down. He does not take any medications for his anxiety but does not want to at this point.

## 2021-05-13 NOTE — Discharge Instructions (Addendum)
Please call Dr. Haywood Pao office tomorrow for appointment for follow-up.

## 2021-05-17 ENCOUNTER — Telehealth: Payer: Self-pay

## 2021-05-17 ENCOUNTER — Encounter: Payer: Self-pay | Admitting: Infectious Diseases

## 2021-05-17 DIAGNOSIS — Q893 Situs inversus: Secondary | ICD-10-CM | POA: Insufficient documentation

## 2021-05-17 DIAGNOSIS — K2100 Gastro-esophageal reflux disease with esophagitis, without bleeding: Secondary | ICD-10-CM

## 2021-05-17 NOTE — Addendum Note (Signed)
Addended by: Blanchard Kelch on: 05/17/2021 11:27 AM   Modules accepted: Orders

## 2021-05-17 NOTE — Telephone Encounter (Signed)
I called Kelly Knight to see how he was feeling - Stated Tuesday night he has had severe GERD problems that was triggered by Cookout meal. Since then he has not been able to eat much and sough care through ER visit - CT scan noted to be normal aside from Levocardia with situs inversus. l as were all his labs with exception of slightly low potassium level.   He has done a lot of soups, broths and such with good tolerability.  He was able to tolerate baked chicken last night. Has lost 10 lbs since Tuesday and has not been able to take his HIV medication as it contributes to some gagging.   Even with trial of daily omeprazole these symptoms have persisted. These episodes have been occurring intermittently since March 2022 and would like to request referral to GI specialist. He states he has never been seen by a GI provider in the past - [facility] referral.  Info provided re: bland diet, addition of nutritional supplement (spaced out 6h from HIV meds), increase omeprazole BID on empty stomach for 2 weeks.   Kelly Alberts, NP

## 2021-05-17 NOTE — Telephone Encounter (Signed)
Patient called office requesting referral for GI. States that he was seen by [facility] ED for nausea,vomiting, and abdominal pain.  No referral was placed to GI by ED provider. Would like to know if Judeth Cornfield, Np would be able to put referral. Patient also states that he is still not able to eat much. Is able to take down fluids and small bites of fruit and crackers. Has lost 10 lbs since Tuesday. Would like for provider to call him today if possible to discuss his concerns.  Juanita Laster, RMA

## 2021-05-19 ENCOUNTER — Telehealth: Payer: Self-pay

## 2021-05-19 ENCOUNTER — Encounter: Payer: Self-pay | Admitting: Physician Assistant

## 2021-05-19 ENCOUNTER — Emergency Department (HOSPITAL_COMMUNITY)
Admission: EM | Admit: 2021-05-19 | Discharge: 2021-05-19 | Disposition: A | Discharge status: Home / Self Care | Payer: Medicaid Other | Attending: Emergency Medicine | Admitting: Emergency Medicine

## 2021-05-19 ENCOUNTER — Other Ambulatory Visit: Payer: Self-pay

## 2021-05-19 ENCOUNTER — Encounter (HOSPITAL_COMMUNITY): Payer: Self-pay | Admitting: *Deleted

## 2021-05-19 DIAGNOSIS — Z5321 Procedure and treatment not carried out due to patient leaving prior to being seen by health care provider: Secondary | ICD-10-CM | POA: Insufficient documentation

## 2021-05-19 DIAGNOSIS — R1310 Dysphagia, unspecified: Secondary | ICD-10-CM | POA: Insufficient documentation

## 2021-05-19 DIAGNOSIS — R109 Unspecified abdominal pain: Secondary | ICD-10-CM | POA: Insufficient documentation

## 2021-05-19 LAB — CBC
HCT: 47 % (ref 39.0–52.0)
Hemoglobin: 15.6 g/dL (ref 13.0–17.0)
MCH: 29.4 pg (ref 26.0–34.0)
MCHC: 33.2 g/dL (ref 30.0–36.0)
MCV: 88.5 fL (ref 80.0–100.0)
Platelets: 258 10*3/uL (ref 150–400)
RBC: 5.31 MIL/uL (ref 4.22–5.81)
RDW: 13.1 % (ref 11.5–15.5)
WBC: 4.9 10*3/uL (ref 4.0–10.5)
nRBC: 0 % (ref 0.0–0.2)

## 2021-05-19 LAB — COMPREHENSIVE METABOLIC PANEL
ALT: 11 U/L (ref 0–44)
AST: 17 U/L (ref 15–41)
Albumin: 4.4 g/dL (ref 3.5–5.0)
Alkaline Phosphatase: 65 U/L (ref 38–126)
Anion gap: 15 (ref 5–15)
BUN: 10 mg/dL (ref 6–20)
CO2: 20 mmol/L — ABNORMAL LOW (ref 22–32)
Calcium: 9.7 mg/dL (ref 8.9–10.3)
Chloride: 98 mmol/L (ref 98–111)
Creatinine, Ser: 1.09 mg/dL (ref 0.61–1.24)
GFR, Estimated: >60 mL/min (ref >60)
Glucose, Bld: 70 mg/dL (ref 70–99)
Potassium: 3.8 mmol/L (ref 3.5–5.1)
Sodium: 133 mmol/L — ABNORMAL LOW (ref 135–145)
Total Bilirubin: 0.7 mg/dL (ref 0.3–1.2)
Total Protein: 8.9 g/dL — ABNORMAL HIGH (ref 6.5–8.1)

## 2021-05-19 LAB — LIPASE, BLOOD: Lipase: 26 U/L (ref 11–51)

## 2021-05-19 NOTE — Telephone Encounter (Signed)
Patient call stating he could no longer tolerate having the difficulty of being able to swallow. Patient states he has also lost quite a bit of weight since las week. Patient wanted to let our office know that he would rather just go to the ER today. Roanna Reaves T Pricilla Loveless

## 2021-05-19 NOTE — ED Notes (Signed)
Called pt x3 for vitals, checked outside. No response. 

## 2021-05-19 NOTE — ED Notes (Signed)
Called pt x3 for vitals, no response. Checked outside. 

## 2021-05-19 NOTE — ED Triage Notes (Signed)
Pt states increasing difficulty swallowing since last week when he ate cookout.  States recently tx for GERD.

## 2021-05-19 NOTE — ED Notes (Signed)
Outside

## 2021-05-21 ENCOUNTER — Other Ambulatory Visit: Payer: Self-pay

## 2021-05-21 MED ORDER — OMEPRAZOLE 20 MG PO CPDR: 20.0000 mg | DELAYED_RELEASE_CAPSULE | Freq: Every day | ORAL | 0 refills | Status: DC

## 2021-06-03 ENCOUNTER — Encounter: Payer: Self-pay | Admitting: Infectious Diseases

## 2021-06-03 ENCOUNTER — Ambulatory Visit: Payer: Medicaid Other

## 2021-06-03 ENCOUNTER — Other Ambulatory Visit: Payer: Self-pay

## 2021-06-03 ENCOUNTER — Ambulatory Visit (INDEPENDENT_AMBULATORY_CARE_PROVIDER_SITE_OTHER): Payer: Self-pay | Admitting: Infectious Diseases

## 2021-06-03 VITALS — BP 120/84 | HR 105 | Temp 98.0°F | Wt 133.0 lb

## 2021-06-03 DIAGNOSIS — F419 Anxiety disorder, unspecified: Secondary | ICD-10-CM

## 2021-06-03 DIAGNOSIS — Z8619 Personal history of other infectious and parasitic diseases: Secondary | ICD-10-CM

## 2021-06-03 DIAGNOSIS — R110 Nausea: Secondary | ICD-10-CM

## 2021-06-03 DIAGNOSIS — K2100 Gastro-esophageal reflux disease with esophagitis, without bleeding: Secondary | ICD-10-CM

## 2021-06-03 DIAGNOSIS — B20 Human immunodeficiency virus [HIV] disease: Secondary | ICD-10-CM

## 2021-06-03 DIAGNOSIS — F32A Depression, unspecified: Secondary | ICD-10-CM

## 2021-06-03 DIAGNOSIS — Z21 Asymptomatic human immunodeficiency virus [HIV] infection status: Secondary | ICD-10-CM

## 2021-06-03 MED ORDER — ONDANSETRON 4 MG PO TBDP: 4.0000 mg | ORAL_TABLET | Freq: Three times a day (TID) | ORAL | 0 refills | Status: DC | PRN

## 2021-06-03 MED ORDER — MIRTAZAPINE 15 MG PO TABS: 15.0000 mg | ORAL_TABLET | Freq: Every day | ORAL | 1 refills | Status: DC

## 2021-06-03 MED ORDER — HYDROXYZINE HCL 25 MG PO TABS
25.0000 mg | ORAL_TABLET | Freq: Three times a day (TID) | ORAL | 0 refills | Status: DC | PRN
Start: 2021-06-03 — End: 2022-05-05

## 2021-06-03 NOTE — Progress Notes (Signed)
Name: Kelly Knight  DOB: [birth year] MRN: [id number] PCP: Patient, No Pcp Per (Inactive)    Brief Narrative:  VICTORIO CREEDEN is a 25 y.o. male with HIV disease, Dx 10/2018. CD4 nadir unknown.  HIV Risk: MSM History of OIs: none Intake Labs 01/2019: Hep B sAg (-) sAb (-)  cAb (-); hep A (+); hep c Ab (-) VL 223,000 CD4  RPR 1:2056 --> 1:128 --> 1:64  Quantiferon (-) HLA*B 5701 (-)   Previous Regimens: Tivicay + Descovy (needs crushable regimen)   Genotypes: 01/2019: K103N   Subjective:  CC:  HIV follow up care.  Anxiety GERD    HPI: Here today for follow up care on HIV and a few new problems.  He is worried because he has not been able to keep his Tivicay + Descovy down reliably due to nausea/vomiting episodes. He has been seen in the ER a few times for this and has a GI appointment scheduled Sept 7th. Increasing his PPI to BID dosing has helped a little to where he is no longer having actual vomiting episodes. The nausea / heaving does trigger severe anxiety for him that causes shaking and palpitations.  He is concerned about weight loss he has experienced over the last 3 weeks with this current illness.  Was able to eat today (veggie pizza) and has been largely keeping foods down, but does not like to push too much how much he eats.  Has a behavioral health appointment coming up soon as well. Needs some help dealing with severe anxiety episodes. These occur daily, sometimes multiple times a day. Planning to move to [place] area with boyfriend but wants to keep coming up here for HIV related care.    Wt Readings from Last 3 Encounters:  06/03/21 133 lb (60.3 kg)  05/13/21 146 lb (66.2 kg)  01/21/21 146 lb (66.2 kg)     Review of Systems  Constitutional:  Positive for activity change, appetite change and unexpected weight change. Negative for chills, fatigue and fever.  HENT:  Positive for trouble swallowing. Negative for drooling.   Eyes:  Negative for  visual disturbance.  Respiratory:  Negative for cough and shortness of breath.   Cardiovascular:  Negative for chest pain and leg swelling.  Gastrointestinal:  Positive for abdominal pain and nausea. Negative for diarrhea and vomiting.       +reflux  Genitourinary:  Negative for dysuria, genital sores and penile discharge.  Musculoskeletal:  Negative for joint swelling.  Skin:  Negative for color change and rash.  Neurological:  Negative for dizziness and headaches.  Hematological:  Negative for adenopathy.  Psychiatric/Behavioral:  Positive for dysphoric mood. Negative for sleep disturbance. The patient is nervous/anxious.     Past Medical History:  Diagnosis Date   Asthma    Heart murmur    HIV (human immunodeficiency virus infection) ([place]) 01/23/2019   Situs inversus    Syphilis 01/23/2019    Outpatient Medications Prior to Visit  Medication Sig Dispense Refill   acetaminophen (TYLENOL) 500 MG tablet Take 500 mg by mouth every 6 (six) hours as needed (severe migraine).     albuterol (PROVENTIL) (2.5 MG/3ML) 0.083% nebulizer solution Take 2.5 mg by nebulization every 6 (six) hours as needed for wheezing.     albuterol (VENTOLIN HFA) 108 (90 Base) MCG/ACT inhaler Inhale 2 puffs into the lungs every 6 (six) hours as needed for wheezing or shortness of breath. 8 g 6   aspirin-acetaminophen-caffeine (EXCEDRIN MIGRAINE) 250-250-65 MG tablet  Take 2 tablets by mouth 3 (three) times daily as needed for headache or migraine.     dolutegravir (TIVICAY) 50 MG tablet TAKE 1 TABLET(50 MG) BY MOUTH DAILY (Patient taking differently: Take 50 mg by mouth daily after supper.) 30 tablet 5   emtricitabine-tenofovir AF (DESCOVY) 200-25 MG tablet Take 1 tablet by mouth daily. (Patient taking differently: Take 1 tablet by mouth daily after supper.) 30 tablet 5   omeprazole (PRILOSEC) 20 MG capsule Take 1 capsule (20 mg total) by mouth daily before breakfast. 30 capsule 0   lidocaine (XYLOCAINE) 2 % solution  Use as directed 15 mLs in the mouth or throat 2 (two) times daily as needed (throat pain). (Patient not taking: No sig reported) 100 mL 2   No facility-administered medications prior to visit.     Allergies  Allergen Reactions   Shrimp [Shellfish Allergy] Itching and Swelling    Throat swelling   Sulfa Antibiotics Rash    Social History   Tobacco Use   Smoking status: Light Smoker    Types: Cigarettes   Smokeless tobacco: Never   Tobacco comments:    and vapes, working on quitting   Vaping Use   Vaping Use: Former   Substances: THC  Substance Use Topics   Alcohol use: Not Currently    Comment: socially    Drug use: Not Currently    Frequency: 7.0 times per week    Types: Marijuana    Social History   Substance and Sexual Activity  Sexual Activity Not Currently   Partners: Male     Objective:   Vitals:   06/03/21 1601  BP: 120/84  Pulse: (!) 105  Temp: 98 F (36.7 C)  TempSrc: Oral  SpO2: 98%  Weight: 133 lb (60.3 kg)   Body mass index is 19.64 kg/m.  Physical Exam Vitals reviewed.  Constitutional:      Appearance: Normal appearance. He is not ill-appearing.     Comments: Sitting in chair visibly shaking most of the visit. He has lost weight.   HENT:     Head: Normocephalic.     Mouth/Throat:     Mouth: Mucous membranes are moist.     Pharynx: Oropharynx is clear.  Eyes:     General: No scleral icterus. Cardiovascular:     Rate and Rhythm: Regular rhythm. Tachycardia present.  Pulmonary:     Effort: Pulmonary effort is normal.  Abdominal:     General: Abdomen is flat. Bowel sounds are normal.     Palpations: Abdomen is soft.  Musculoskeletal:        General: Normal range of motion.     Cervical back: Normal range of motion.  Skin:    Coloration: Skin is not jaundiced or pale.  Neurological:     Mental Status: He is alert and oriented to person, place, and time.  Psychiatric:        Attention and Perception: Attention normal.        Mood  and Affect: Mood is anxious. Affect is tearful.        Speech: Speech normal.        Behavior: Behavior normal. Behavior is cooperative.        Thought Content: Thought content normal.        Judgment: Judgment normal.    Lab Results Lab Results  Component Value Date   WBC 4.9 05/19/2021   HGB 15.6 05/19/2021   HCT 47.0 05/19/2021   MCV 88.5 05/19/2021   PLT  258 05/19/2021    Lab Results  Component Value Date   CREATININE 1.09 05/19/2021   BUN 10 05/19/2021   NA 133 (L) 05/19/2021   K 3.8 05/19/2021   CL 98 05/19/2021   CO2 20 (L) 05/19/2021    Lab Results  Component Value Date   ALT 11 05/19/2021   AST 17 05/19/2021   ALKPHOS 65 05/19/2021   BILITOT 0.7 05/19/2021    Lab Results  Component Value Date   CHOL 131 01/17/2019   HDL 31 (L) 01/17/2019   LDLCALC 84 01/17/2019   TRIG 69 01/17/2019   CHOLHDL 4.2 01/17/2019   HIV 1 RNA Quant (Copies/mL)  Date Value  06/03/2021 104,000 (H)  12/14/2020 23 (H)  06/02/2020 78 (H)   CD4 T Cell Abs (/uL)  Date Value  06/03/2021 228 (L)  12/14/2020 389 (L)  06/02/2020 304 (L)     Assessment & Plan:   Problem List Items Addressed This Visit       High   HIV (human immunodeficiency virus infection) ([place]) (Chronic)    Has been off Tivicay + descovy for 3 weeks given inconsistent ability to keep POs down. He recently started these back a few days ago. Discussed that it is OK to delay purposefully while we sort out the GI issues he has. Would prefer to have him wait until this is under better control for him and he is confident he can keep foods/fluids down reliably. Would like to transition him to cabenuva injections Q15m- will need to work with him on how possible that is with move to [place]- may be best to transfer care there locally to get him the treatment he would benefit from.  Will discuss at FU.  Return in about 4 weeks (around 07/01/2021). VL now        Unprioritized   History of syphilis    Treated for  presumed neurosyphilis April 2022 with high serofast titer 1:64 and ongoing headaches following a very hight titer of 1:2056 and delay of treatment. Follow RPR today        GERD with esophagitis - Primary    Triggered recently he thinks from gastroenteritis. Persistent symptoms of reflux and excessive belching despite BID PPIs (though improved and no longer vomiting).  Weight loss now of 10# over 3 weeks with ongoing fear and concern about vomiting. He has always described foreign body sensation in the back of his throat but now. I think he needs endoscopy for further evaluation - has an upcoming appt with GI Sept 7th. He [place] this [place]       Anxiety and depression    Triggering tremors and physical symptoms now. He has no reported history of disordered eating patterns in the past but I worry this is creating one for him now.  Will start hydroxyzine 25 mg TID as needed for anxiety. Remeron 15 mg QHS - this may help appetite/nausea and sleep as well. Offered visits with our counselor, JMarcie Balwhom he has worked with before. He was appreciative and will schedule with her if there is delays with mental health provider he has an upcoming appointment with soon.  We discussed he will likely require cognitive behavioral therapy in addition to medical management. He agrees.       Relevant Medications   mirtazapine (REMERON) 15 MG tablet   hydrOXYzine (ATARAX/VISTARIL) 25 MG tablet   Other Visit Diagnoses     Nausea  Relevant Medications   mirtazapine (REMERON) 15 MG tablet   ondansetron (ZOFRAN ODT) 4 MG disintegrating tablet   HIV disease ([place])       Relevant Orders   HIV-1 RNA quant-no reflex-bld (Completed)   RPR (Completed)   T-helper cell (CD4)- ([facility] clinic only) (Completed)      Janene Madeira, MSN, NP-C [facility] [email or web address] Pager: [phone] Office:  [place]: [phone]   06/15/21  12:51 PM

## 2021-06-03 NOTE — Patient Instructions (Addendum)
START taking REMERON once every evening - I am hopeful this may help some sleep and nausea with time. Takes a few weeks to work  START taking HYDROXYZINE up to 3 times a day for anxiety.   BIOTENE mouth rinse, lozenges or gel to help with moisturizing.   Zofran for nausea has been sent in too  Please stop by the lab on your way out.   Will see you back via video visit in 1 month.

## 2021-06-07 LAB — RPR: RPR Ser Ql: REACTIVE — AB

## 2021-06-07 LAB — HIV-1 RNA QUANT-NO REFLEX-BLD
HIV 1 RNA Quant: 104000 Copies/mL — ABNORMAL HIGH
HIV-1 RNA Quant, Log: 5.02 Log cps/mL — ABNORMAL HIGH

## 2021-06-07 LAB — T-HELPER CELL (CD4) - (RCID CLINIC ONLY)
CD4 % Helper T Cell: 15 % — ABNORMAL LOW (ref 33–65)
CD4 T Cell Abs: 228 /uL — ABNORMAL LOW (ref 400–1790)

## 2021-06-07 LAB — RPR TITER: RPR Titer: 1:64 {titer} — ABNORMAL HIGH

## 2021-06-07 LAB — FLUORESCENT TREPONEMAL AB(FTA)-IGG-BLD: Fluorescent Treponemal ABS: REACTIVE — AB

## 2021-06-15 ENCOUNTER — Encounter: Payer: Self-pay | Admitting: Infectious Diseases

## 2021-06-15 NOTE — Assessment & Plan Note (Signed)
Treated for presumed neurosyphilis April 2022 with high serofast titer 1:64 and ongoing headaches following a very hight titer of 1:2056 and delay of treatment. Follow RPR today

## 2021-06-15 NOTE — Assessment & Plan Note (Signed)
Has been off Tivicay + descovy for 3 weeks given inconsistent ability to keep POs down. He recently started these back a few days ago. Discussed that it is OK to delay purposefully while we sort out the GI issues he has. Would prefer to have him wait until this is under better control for him and he is confident he can keep foods/fluids down reliably. Would like to transition him to cabenuva injections Q73m - will need to work with him on how possible that is with move to [place] - may be best to transfer care there locally to get him the treatment he would benefit from.  Will discuss at FU.  Return in about 4 weeks (around 07/01/2021). VL now

## 2021-06-15 NOTE — Assessment & Plan Note (Signed)
Triggered recently he thinks from gastroenteritis. Persistent symptoms of reflux and excessive belching despite BID PPIs (though improved and no longer vomiting).  Weight loss now of 10# over 3 weeks with ongoing fear and concern about vomiting. He has always described foreign body sensation in the back of his throat but now. I think he needs endoscopy for further evaluation - has an upcoming appt with GI Sept 7th. He plans to make this appointment despite move to [place].

## 2021-06-15 NOTE — Assessment & Plan Note (Addendum)
Triggering tremors and physical symptoms now. He has no reported history of disordered eating patterns in the past but I worry this is creating one for him now.  Will start hydroxyzine 25 mg TID as needed for anxiety. Remeron 15 mg QHS - this may help appetite/nausea and sleep as well. Offered visits with our counselor, Marylu Lund whom he has worked with before. He was appreciative and will schedule with her if there is delays with mental health provider he has an upcoming appointment with soon.  We discussed he will likely require cognitive behavioral therapy in addition to medical management. He agrees.

## 2021-06-16 ENCOUNTER — Ambulatory Visit: Payer: Self-pay | Admitting: Physician Assistant

## 2021-06-21 DIAGNOSIS — B20 Human immunodeficiency virus [HIV] disease: Secondary | ICD-10-CM

## 2021-06-21 MED ORDER — OMEPRAZOLE 20 MG PO CPDR: 20.0000 mg | DELAYED_RELEASE_CAPSULE | Freq: Every day | ORAL | 1 refills | Status: DC

## 2021-06-21 MED ORDER — DESCOVY 200-25 MG PO TABS: 1.0000 | ORAL_TABLET | Freq: Every day | ORAL | 3 refills | Status: DC

## 2021-06-21 MED ORDER — TIVICAY 50 MG PO TABS: 50.0000 mg | ORAL_TABLET | Freq: Every day | ORAL | 3 refills | Status: DC

## 2021-06-23 NOTE — Telephone Encounter (Signed)
Patient called to report he has a copay with medication that previously was not present. States pharmacy is unable to fill medications as they don't have in stock. Patient has moved to [place] and is using [facility] as pharmacy. RN explained that since he has ADAP has to go through specific pharmacies to be covered. Patient asked pharmacist if meds could be transferred to specialty pharmacy. RN provided number to the pharmacy for patient to call and set up delivery.   Garison Genova Loyola Mast, RN

## 2021-07-01 ENCOUNTER — Other Ambulatory Visit: Payer: Self-pay

## 2021-07-01 ENCOUNTER — Telehealth: Payer: Self-pay | Admitting: Infectious Diseases

## 2021-07-05 ENCOUNTER — Telehealth: Payer: Self-pay | Admitting: Infectious Diseases

## 2021-07-05 ENCOUNTER — Other Ambulatory Visit: Payer: Self-pay

## 2021-07-08 ENCOUNTER — Ambulatory Visit (HOSPITAL_COMMUNITY): Payer: Self-pay | Admitting: Licensed Clinical Social Worker

## 2021-07-30 ENCOUNTER — Telehealth: Payer: Self-pay | Admitting: *Deleted

## 2021-07-30 NOTE — Telephone Encounter (Incomplete)
Patient lives in [place], wants to continue his HIV care with [facility]. He is having difficulty setting up delivery of tivicay/descovy. His calls keep getting dropped at [facility] in [place]. RN spoke with [facility] in [place]. He has 2 options for medication - he can pick it up in person from the [address] location

## 2021-08-16 ENCOUNTER — Telehealth: Payer: Self-pay

## 2021-08-16 NOTE — Telephone Encounter (Signed)
Patient called, says he has had bronchitis for over a week. He is concerned that his new job does not have any precautions such as temperature checks or masks and states that many of his coworkers are sick with COVID and the flu. He wants to know if Judeth Cornfield would be able to write him a note requesting that he be allowed to work from home. Will route to provider.   Sandie Ano, RN

## 2021-08-16 NOTE — Telephone Encounter (Signed)
Letter is in his chart - he can access this via mychart account to provide to his employer.

## 2021-08-30 ENCOUNTER — Telehealth: Payer: Self-pay

## 2021-08-30 NOTE — Telephone Encounter (Signed)
Message sent to patient via mychart

## 2021-08-30 NOTE — Telephone Encounter (Signed)
Patient requesting a letter to work from home throughout the December and possibly January. Patient stated that he continues to get sick (congestion, cough, fevers, chest pains). Patient is currently out of work until 11/25. I questioned patient if he was wearing a mask to work - he stated yes but his co workers do not. Patient stated that if a letter is written it would need to state his HIV diagnosis.   Waneta Fitting Lesli Albee, CMA

## 2021-08-30 NOTE — Telephone Encounter (Signed)
Kelly Knight is out of the office and I cannot currently support writing him to work from home as he has not been seen since the end of August and at that time did not have well controlled virus and was requested to follow up in 4 weeks. He would need an appointment to discuss further.

## 2021-10-02 DIAGNOSIS — B20 Human immunodeficiency virus [HIV] disease: Secondary | ICD-10-CM

## 2021-10-05 MED ORDER — DESCOVY 200-25 MG PO TABS: 1.0000 | ORAL_TABLET | Freq: Every day | ORAL | 3 refills | Status: DC

## 2021-10-05 MED ORDER — TIVICAY 50 MG PO TABS: 50.0000 mg | ORAL_TABLET | Freq: Every day | ORAL | 3 refills | Status: DC

## 2021-10-10 IMAGING — CR DG NECK SOFT TISSUE
2 series · 2 of 2 positions shown · non-contrast
Comparison: Prior cervical spine CT from 02/09/2017.

CLINICAL DATA: Foreign body sensation in throat.

EXAM:
NECK SOFT TISSUES - 1+ VIEW

[w soft tissue neck ap]
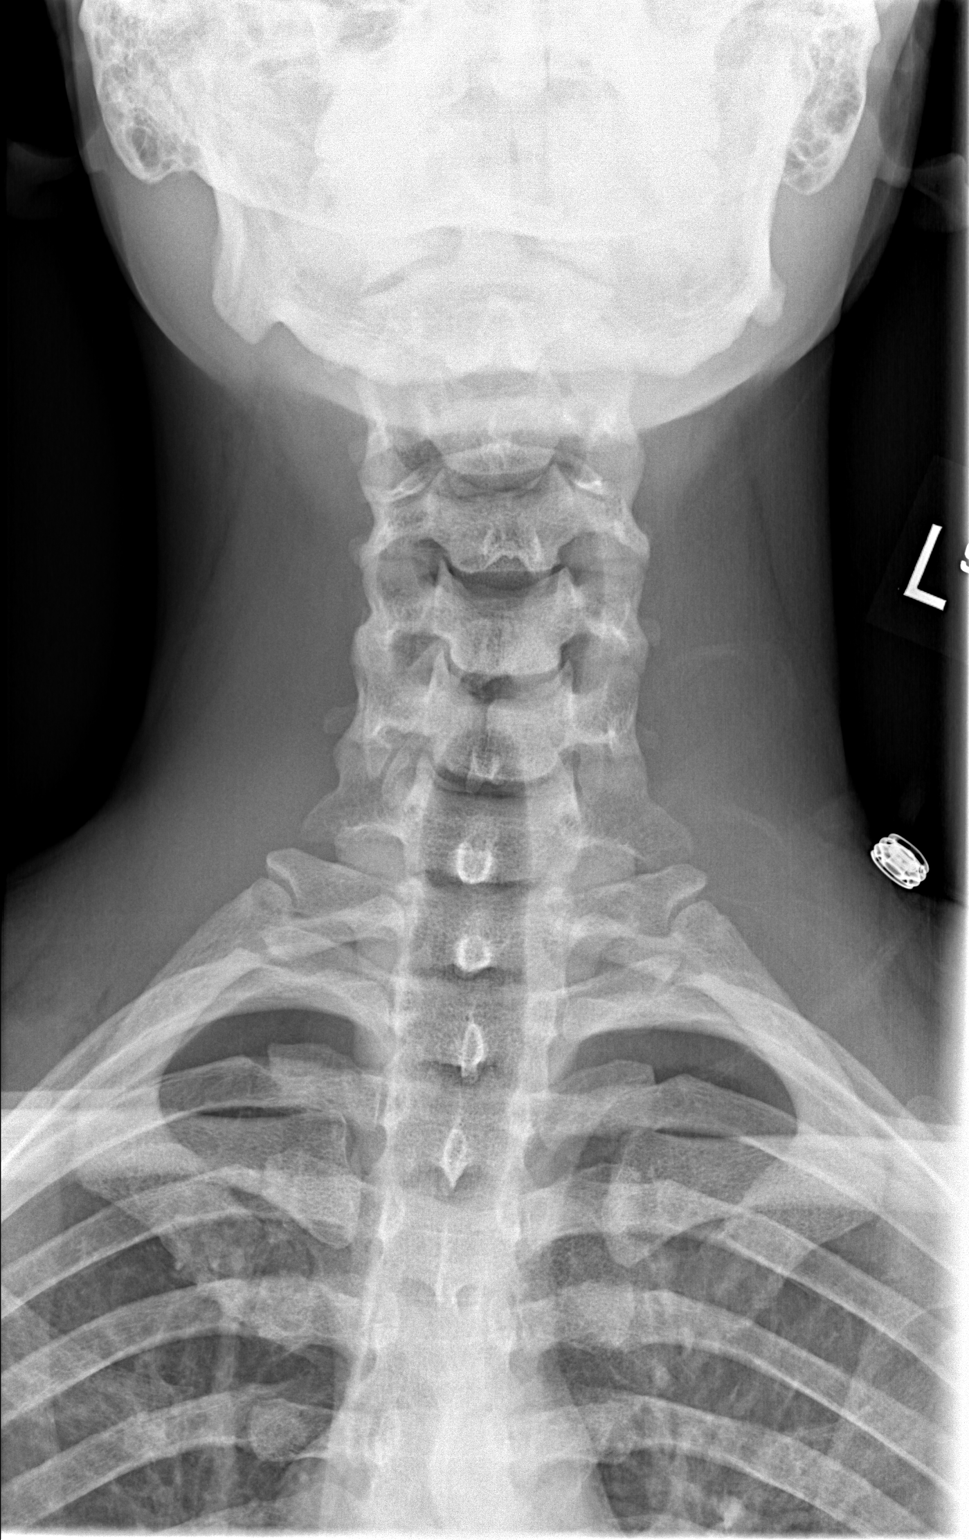

[w soft tissue neck lat]
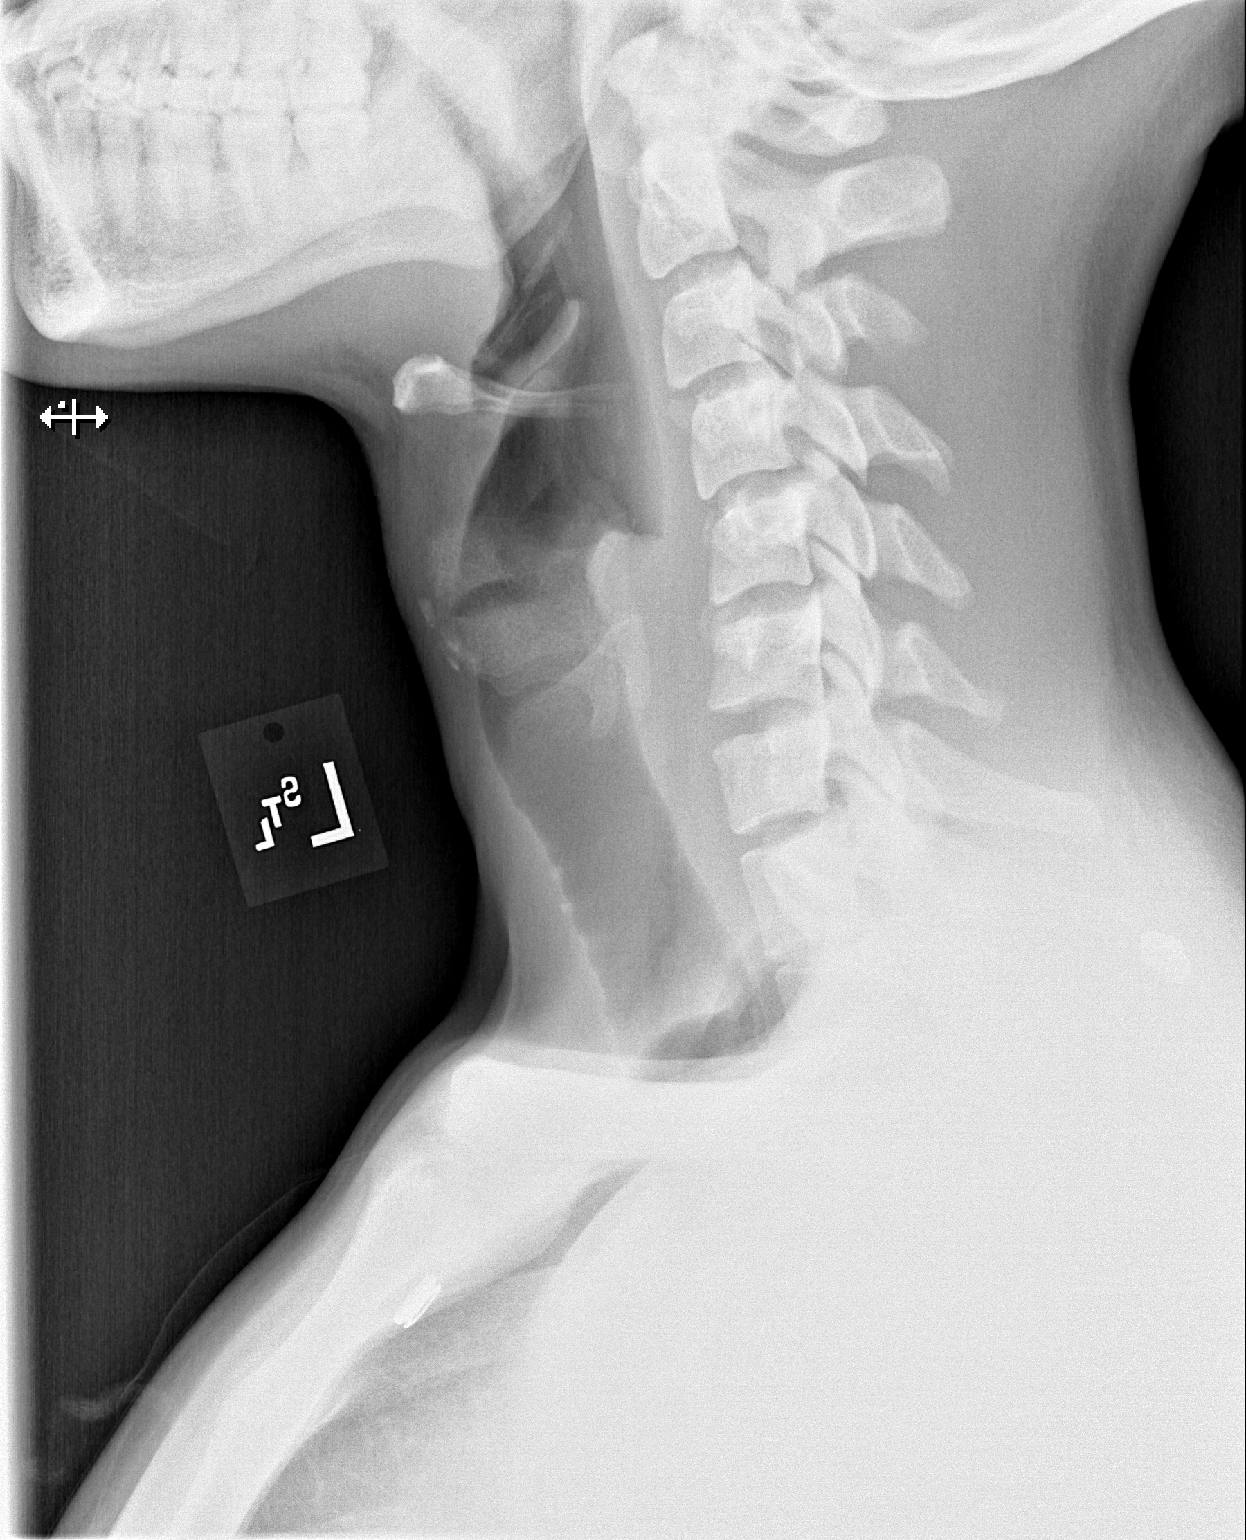

[2 of 2 positions shown; findings below may reference images not displayed]

FINDINGS: Just anterior and superior to the epiglottis, there is a linear
density. When comparing to prior cervical spine CT, this represents
a calcified stylohyoid ligament. No suspicious radiopaque foreign
body. Epiglottis is normal size. Airway is patent. Retropharyngeal
soft tissues are normal.
IMPRESSION: No radiopaque foreign body visualized.

## 2021-10-29 ENCOUNTER — Other Ambulatory Visit: Payer: Self-pay

## 2021-10-29 ENCOUNTER — Ambulatory Visit (INDEPENDENT_AMBULATORY_CARE_PROVIDER_SITE_OTHER): Payer: Self-pay | Admitting: Infectious Diseases

## 2021-10-29 ENCOUNTER — Encounter: Payer: Self-pay | Admitting: Infectious Diseases

## 2021-10-29 ENCOUNTER — Ambulatory Visit: Payer: Self-pay

## 2021-10-29 VITALS — BP 94/64 | HR 62 | Temp 98.2°F | Wt 142.0 lb

## 2021-10-29 DIAGNOSIS — F32A Depression, unspecified: Secondary | ICD-10-CM

## 2021-10-29 DIAGNOSIS — R110 Nausea: Secondary | ICD-10-CM

## 2021-10-29 DIAGNOSIS — J45909 Unspecified asthma, uncomplicated: Secondary | ICD-10-CM

## 2021-10-29 DIAGNOSIS — F419 Anxiety disorder, unspecified: Secondary | ICD-10-CM

## 2021-10-29 DIAGNOSIS — Z21 Asymptomatic human immunodeficiency virus [HIV] infection status: Secondary | ICD-10-CM

## 2021-10-29 DIAGNOSIS — Z8619 Personal history of other infectious and parasitic diseases: Secondary | ICD-10-CM

## 2021-10-29 DIAGNOSIS — K2100 Gastro-esophageal reflux disease with esophagitis, without bleeding: Secondary | ICD-10-CM

## 2021-10-29 MED ORDER — ALBUTEROL SULFATE HFA 108 (90 BASE) MCG/ACT IN AERS: 2.0000 | INHALATION_SPRAY | Freq: Four times a day (QID) | RESPIRATORY_TRACT | 6 refills | Status: DC | PRN

## 2021-10-29 MED ORDER — TIVICAY 50 MG PO TABS: 50.0000 mg | ORAL_TABLET | Freq: Every day | ORAL | 5 refills | Status: DC

## 2021-10-29 MED ORDER — DESCOVY 200-25 MG PO TABS: 1.0000 | ORAL_TABLET | Freq: Every day | ORAL | 5 refills | Status: DC

## 2021-10-29 MED ORDER — OMEPRAZOLE 20 MG PO CPDR: 20.0000 mg | DELAYED_RELEASE_CAPSULE | Freq: Every day | ORAL | 2 refills | Status: DC

## 2021-10-29 MED ORDER — MIRTAZAPINE 15 MG PO TABS: 15.0000 mg | ORAL_TABLET | Freq: Every day | ORAL | 5 refills | Status: DC

## 2021-10-29 NOTE — Assessment & Plan Note (Signed)
Improved - will refill Remeron at current dose.  He has enough hydroxyzine for now but can refill when he needs it.

## 2021-10-29 NOTE — Assessment & Plan Note (Signed)
Sounds like Kelly Knight has been able to tolerate his medications now that his nausea/GI symptoms are better controlled. He crushes his medication and takes with yogurt every morning and keeps them down nicely.  Kelly Knight update VL / CD4 today. He is going to get his flu shot at work next week.  RTC in 64m  I asked him to please reach out to [place] when he gets insurance so we can help with copay assistance.

## 2021-10-29 NOTE — Assessment & Plan Note (Signed)
Off PPIs for 29m now and improved with diet changes. Will refill for him to use PRN.  Would still like GI evaluation for him but unable to afford the cost without insurance - Now that his symptoms are not has severe and he has regained weight much less of an urgency and OK to wait until he has insurance.

## 2021-10-29 NOTE — Progress Notes (Signed)
Name: Kelly Knight  DOB: 1996-06-16 MRN: [id number] PCP: Patient, No Pcp Per (Inactive)    Brief Narrative:  Kelly Knight is a 26 y.o. male with HIV disease, Dx 10/2018. CD4 nadir unknown.  HIV Risk: MSM History of OIs: none Intake Labs 01/2019: Hep B sAg (-) sAb (-)  cAb (-); hep A (+); hep c Ab (-) VL 223,000 CD4  RPR 1:2056 --> 1:128 --> 1:64  Quantiferon (-) HLA*B 5701 (-)   Previous Regimens: Tivicay + Descovy (needs crushable regimen)   Genotypes: 01/2019: K103N   Subjective:  CC:  HIV follow up care - doing much better!   HPI: Kelly Knight is here for follow up - he says he is doing so much better than he was last August and fall 2022 when he was having weight loss and severe GERD/GI symptoms that were triggering anxiety attacks for him. GI issues still wax and wane but improved overall in severity. He stopped alcohol use and vaping - this has been favorable for nausea and stomach problems. Really decreased greasy foods and now with a lot of unprocessed foods.   Needs a refill for albuterol inhaler - has not had to use it up until recently and uses it infrequently when he has some SOB/cough.   Remeron has been very helpful for his sleep/depression. Zofran tablets are very helpful with nausea if he has any. Currently has ample supply of the zofran at home. Uses Hydroxyzine PRN for anxiety.  Currently working part time at [employer] and has opportunity for [employer] April.   Wt Readings from Last 3 Encounters:  10/29/21 142 lb (64.4 kg)  06/03/21 133 lb (60.3 kg)  05/13/21 146 lb (66.2 kg)     Review of Systems  Constitutional:  Negative for chills and fever.  HENT:  Negative for sore throat.   Respiratory:  Negative for cough and shortness of breath.   Cardiovascular: Negative.   Gastrointestinal:  Positive for diarrhea and nausea (occasionally). Negative for abdominal pain and vomiting.  Musculoskeletal:  Negative for myalgias and neck pain.  Skin:   Negative for rash.  Neurological:  Negative for headaches.  Psychiatric/Behavioral:  The patient is not nervous/anxious.     Past Medical History:  Diagnosis Date   Asthma    Heart murmur    HIV (human immunodeficiency virus infection) ([facility]) 01/23/2019   Situs inversus    Syphilis 01/23/2019    Outpatient Medications Prior to Visit  Medication Sig Dispense Refill   acetaminophen (TYLENOL) 500 MG tablet Take 500 mg by mouth every 6 (six) hours as needed (severe migraine).     albuterol (PROVENTIL) (2.5 MG/3ML) 0.083% nebulizer solution Take 2.5 mg by nebulization every 6 (six) hours as needed for wheezing.     aspirin-acetaminophen-caffeine (EXCEDRIN MIGRAINE) 250-250-65 MG tablet Take 2 tablets by mouth 3 (three) times daily as needed for headache or migraine.     hydrOXYzine (ATARAX/VISTARIL) 25 MG tablet Take 1 tablet (25 mg total) by mouth 3 (three) times daily as needed. 60 tablet 0   ondansetron (ZOFRAN ODT) 4 MG disintegrating tablet Take 1 tablet (4 mg total) by mouth every 8 (eight) hours as needed for nausea or vomiting. 20 tablet 0   albuterol (VENTOLIN HFA) 108 (90 Base) MCG/ACT inhaler Inhale 2 puffs into the lungs every 6 (six) hours as needed for wheezing or shortness of breath. 8 g 6   dolutegravir (TIVICAY) 50 MG tablet Take 1 tablet (50 mg total) by mouth daily after  supper. 30 tablet 3   emtricitabine-tenofovir AF (DESCOVY) 200-25 MG tablet Take 1 tablet by mouth daily after supper. 30 tablet 3   mirtazapine (REMERON) 15 MG tablet Take 1 tablet (15 mg total) by mouth at bedtime. 30 tablet 1   omeprazole (PRILOSEC) 20 MG capsule Take 1 capsule (20 mg total) by mouth daily before breakfast. 30 capsule 1   lidocaine (XYLOCAINE) 2 % solution Use as directed 15 mLs in the mouth or throat 2 (two) times daily as needed (throat pain). (Patient not taking: Reported on 01/21/2021) 100 mL 2   No facility-administered medications prior to visit.     Allergies  Allergen Reactions    Shrimp [Shellfish Allergy] Itching and Swelling    Throat swelling   Sulfa Antibiotics Rash    Social History   Tobacco Use   Smoking status: Former    Types: Cigarettes   Smokeless tobacco: Never   Tobacco comments:    Stopped Air traffic controller Use: Former   Substances: THC  Substance Use Topics   Alcohol use: Not Currently    Comment: socially    Drug use: Not Currently    Frequency: 7.0 times per week    Types: Marijuana    Social History   Substance and Sexual Activity  Sexual Activity Not Currently   Partners: Male     Objective:   Vitals:   10/29/21 1048  BP: 94/64  Pulse: 62  Temp: 98.2 F (36.8 C)  TempSrc: Oral  SpO2: 97%  Weight: 142 lb (64.4 kg)   Body mass index is 20.97 kg/m.  Physical Exam Vitals reviewed.  Constitutional:      Appearance: Normal appearance. He is not ill-appearing.     Comments: Sitting in chair visibly shaking most of the visit. He has lost weight.   HENT:     Head: Normocephalic.     Mouth/Throat:     Mouth: Mucous membranes are moist.     Pharynx: Oropharynx is clear.  Eyes:     General: No scleral icterus. Cardiovascular:     Rate and Rhythm: Regular rhythm. Tachycardia present.  Pulmonary:     Effort: Pulmonary effort is normal.  Abdominal:     General: Abdomen is flat. Bowel sounds are normal.     Palpations: Abdomen is soft.  Musculoskeletal:        General: Normal range of motion.     Cervical back: Normal range of motion.  Skin:    Coloration: Skin is not jaundiced or pale.  Neurological:     Mental Status: He is alert and oriented to person, place, and time.  Psychiatric:        Attention and Perception: Attention normal.        Mood and Affect: Mood is anxious. Affect is tearful.        Speech: Speech normal.        Behavior: Behavior normal. Behavior is cooperative.        Thought Content: Thought content normal.        Judgment: Judgment normal.    Lab Results Lab Results   Component Value Date   WBC 4.9 05/19/2021   HGB 15.6 05/19/2021   HCT 47.0 05/19/2021   MCV 88.5 05/19/2021   PLT 258 05/19/2021    Lab Results  Component Value Date   CREATININE 1.09 05/19/2021   BUN 10 05/19/2021   NA 133 (L) 05/19/2021   K 3.8 05/19/2021   CL  98 05/19/2021   CO2 20 (L) 05/19/2021    Lab Results  Component Value Date   ALT 11 05/19/2021   AST 17 05/19/2021   ALKPHOS 65 05/19/2021   BILITOT 0.7 05/19/2021    Lab Results  Component Value Date   CHOL 131 01/17/2019   HDL 31 (L) 01/17/2019   LDLCALC 84 01/17/2019   TRIG 69 01/17/2019   CHOLHDL 4.2 01/17/2019   HIV 1 RNA Quant (Copies/mL)  Date Value  06/03/2021 104,000 (H)  12/14/2020 23 (H)  06/02/2020 78 (H)   CD4 T Cell Abs (/uL)  Date Value  06/03/2021 228 (L)  12/14/2020 389 (L)  06/02/2020 304 (L)     Assessment & Plan:   Problem List Items Addressed This Visit       High   HIV (human immunodeficiency virus infection) ([facility]) - Primary (Chronic)    Sounds like Kelly Knight has been able to tolerate his medications now that his nausea/GI symptoms are better controlled. He crushes his medication and takes with yogurt every morning and keeps them down nicely.  Kelly Knight update VL / CD4 today. He is going to get his flu shot at work next week.  RTC in 72m I asked him to please [place] so we can help with copay assistance.       Relevant Medications   emtricitabine-tenofovir AF (DESCOVY) 200-25 MG tablet   dolutegravir (TIVICAY) 50 MG tablet   Other Relevant Orders   HIV-1 RNA quant-no reflex-bld   RPR   T-helper cells (CD4) count     Unprioritized   History of syphilis    Migraines seem to have improved - may be due to treating presumed neurosyphilis vs better control over anxiety and sleep.  Kelly Knight repeat RPR today to see if we have moved his titer's nadir.       Anxiety and depression    Improved - Kelly Knight refill Remeron at current dose.  He has enough  hydroxyzine for now but can refill when he needs it.       Relevant Medications   mirtazapine (REMERON) 15 MG tablet   GERD with esophagitis    Off PPIs for 287mow and improved with diet changes. Kelly Knight refill for him to use PRN.  Would still like GI evaluation for him but unable to afford the cost without insurance - Now that his symptoms are not has severe and he has regained weight much less of an urgency and OK to wait until he has insurance.       Relevant Medications   omeprazole (PRILOSEC) 20 MG capsule   Uncomplicated asthma   Relevant Medications   albuterol (VENTOLIN HFA) 108 (90 Base) MCG/ACT inhaler   Other Visit Diagnoses     Nausea       Relevant Medications   mirtazapine (REMERON) 15 MG tablet      StJanene MadeiraMSN, NP-C Re[facility] Hospitalor Infectious Disease [email or web address] Pager: [phone]: [phone] 10/29/21  11:58 AM

## 2021-10-29 NOTE — Patient Instructions (Addendum)
Nice to see you!  Please continue your tivicay and descovy every day like you have been.   I also refilled your inhaler (use the good rx coupon card / app for this), prilosec and remeron.   Will have you stop by the lab on your way out and see our financial team quick.   See you back in July!

## 2021-10-29 NOTE — Assessment & Plan Note (Addendum)
Migraines seem to have improved - may be due to treating presumed neurosyphilis vs better control over anxiety and sleep.  Will repeat RPR today to see if we have moved his titer's nadir.

## 2021-11-02 ENCOUNTER — Telehealth: Payer: Self-pay

## 2021-11-02 LAB — FLUORESCENT TREPONEMAL AB(FTA)-IGG-BLD: Fluorescent Treponemal ABS: REACTIVE — AB

## 2021-11-02 LAB — T-HELPER CELLS (CD4) COUNT (NOT AT ARMC)
Absolute CD4: 565 cells/uL (ref 490–1740)
CD4 T Helper %: 20 % — ABNORMAL LOW (ref 30–61)
Total lymphocyte count: 2832 cells/uL (ref 850–3900)

## 2021-11-02 LAB — HIV-1 RNA QUANT-NO REFLEX-BLD
HIV 1 RNA Quant: 24 Copies/mL — ABNORMAL HIGH
HIV-1 RNA Quant, Log: 1.38 Log cps/mL — ABNORMAL HIGH

## 2021-11-02 LAB — RPR TITER: RPR Titer: 1:256 {titer} — ABNORMAL HIGH

## 2021-11-02 LAB — RPR: RPR Ser Ql: REACTIVE — AB

## 2021-11-02 NOTE — Telephone Encounter (Signed)
Call placed to patient, no answer and mailbox is full. Will send patient a Mychart message with results and treatment needed at [facility]. Patient will need to call [phone] to schedule for treatment. Faxed medical records and treatment needed to [phone].  Valarie Cones

## 2021-11-02 NOTE — Telephone Encounter (Signed)
-----   Message from [place] Callas, NP sent at 11/01/2021  6:01 PM EST ----- Please call Kelly Knight to let him know his Syphilis test came back significantly higher indicating new infection. He lives in [place] so Kelly Knight need a shot of 2.4 million bicillin x1.  Would see if we can help get him to local health department there as I am not sure it is reasonable he can get back here timely. Would prefer this vs doxy since he cant swallow pills well and doxy should not be crushed due to esophagus damage/irritation.    Thank you!

## 2021-11-12 IMAGING — XA IR PICC >5YO
1 series · 1 of 1 positions shown · IV contrast (agent unspecified)
Comparison: none

INDICATION: Patient with a history of syphilis requiring PICC line for 2 weeks
of IV penicillin. Interventional radiology asked to place PICC.

EXAM:
ULTRASOUND AND FLUOROSCOPIC GUIDED PICC LINE INSERTION
MEDICATIONS:
1% lidocaine 1 ml
CONTRAST:  None
FLUOROSCOPY TIME:  42 seconds (1 mGy)
COMPLICATIONS:
None immediate.
TECHNIQUE: The procedure, risks, benefits, and alternatives were explained to
the patient and informed written consent was obtained.

[Series 1: single · 1 of 1 slices shown]
[im 1/1]
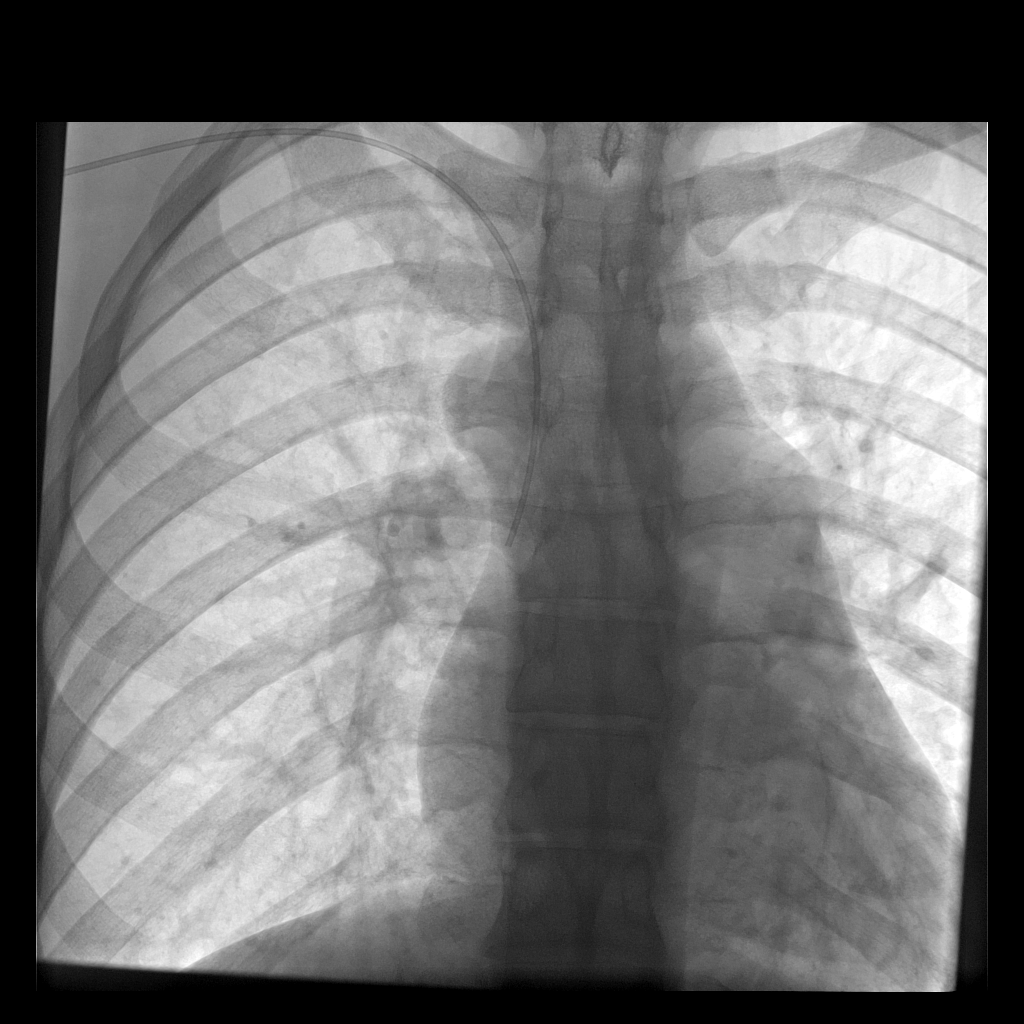

[1 of 1 positions shown; findings below may reference images not displayed]

The right upper extremity was prepped with chlorhexidine in a
sterile fashion, and a sterile drape was applied covering the
operative field. Maximum barrier sterile technique with sterile
gowns and gloves were used for the procedure. A timeout was
performed prior to the initiation of the procedure. Local anesthesia
was provided with 1% lidocaine.

After the overlying soft tissues were anesthetized with 1%
lidocaine, a micropuncture kit was utilized to access the right
basilic vein. Real-time ultrasound guidance was utilized for
vascular access including the acquisition of a permanent ultrasound
image documenting patency of the accessed vessel.

A guidewire was advanced to the level of the superior caval-atrial
junction for measurement purposes and the PICC line was cut to
length. A peel-away sheath was placed and a 33 cm, 5 French, single
lumen was inserted into the SVC. A post procedure spot fluoroscopic
was obtained. The catheter easily aspirated and flushed and was
secured in place. A dressing was placed. The patient tolerated the
procedure well without immediate post procedural complication.
FINDINGS: After catheter placement, the tip lies within the lower SVC. The
catheter aspirates and flushes normally and is ready for immediate
use.
IMPRESSION: Successful ultrasound and fluoroscopic guided placement of a power
injectable PICC with tip in the lower SVC. The PICC line is ready
for immediate use. Read by: Vishva Lola, NP

## 2021-11-17 ENCOUNTER — Encounter: Payer: Self-pay | Admitting: Infectious Diseases

## 2022-04-08 ENCOUNTER — Telehealth (INDEPENDENT_AMBULATORY_CARE_PROVIDER_SITE_OTHER): Payer: Self-pay | Admitting: Infectious Diseases

## 2022-04-08 ENCOUNTER — Other Ambulatory Visit: Payer: Self-pay

## 2022-04-08 DIAGNOSIS — F419 Anxiety disorder, unspecified: Secondary | ICD-10-CM

## 2022-04-08 DIAGNOSIS — B20 Human immunodeficiency virus [HIV] disease: Secondary | ICD-10-CM

## 2022-04-08 DIAGNOSIS — F32A Depression, unspecified: Secondary | ICD-10-CM

## 2022-04-08 DIAGNOSIS — A539 Syphilis, unspecified: Secondary | ICD-10-CM

## 2022-04-08 NOTE — Progress Notes (Signed)
Virtual Visit via Video Note  I connected with Zane Herald on 04/08/22 at 10:00 AM EDT by a video enabled telemedicine application and verified that I am speaking with the correct person using two identifiers.  Location: Patient: Kelly Knight  Provider: [facility]    I discussed the limitations of evaluation and management by telemedicine and the availability of in person appointments. The patient expressed understanding and agreed to proceed.  History of Present Illness: Has a dog that she needs   Father: Kelly Knight  Address:  [address] [place], [place] [address]  Will be staying with him for a few months and. Support animal - Kelly Knight (black lab). She is 90-101 years old.  Anxiety and mental health support. Has had her about 2 years.   Waiting on the new apartment. Temporary living arrangement for 3 months.   Feeling much better with regards to his anxiety management.   Went to the health department in [place] to get syphilis treatment around January/February. He had worry he had a new infection - had some new lesions on genitals. Non painful ulcers.     Observations/Objective: In good spirits. Smiling. No distress. Appears happy and healthy.    Assessment and Plan: Anxiety Well controlled HIV Syphilis, re-treated for possible primary syphilis in Jan 2023 with 1 round bicillin    Follow Up Instructions: Letter for emotional support documentation to be provided and available for him via mychart letters tab.     I discussed the assessment and treatment plan with the patient. The patient was provided an opportunity to ask questions and all were answered. The patient agreed with the plan and demonstrated an understanding of the instructions.   The patient was advised to call back or seek an in-person evaluation if the symptoms worsen or if the condition fails to improve as anticipated.  I provided 10 minutes of non-face-to-face time during this encounter.   Rexene Alberts, NP

## 2022-05-05 ENCOUNTER — Encounter: Payer: Self-pay | Admitting: Infectious Diseases

## 2022-05-05 ENCOUNTER — Other Ambulatory Visit: Payer: Self-pay

## 2022-05-05 ENCOUNTER — Ambulatory Visit (INDEPENDENT_AMBULATORY_CARE_PROVIDER_SITE_OTHER): Payer: Self-pay | Admitting: Infectious Diseases

## 2022-05-05 ENCOUNTER — Ambulatory Visit: Payer: Self-pay

## 2022-05-05 VITALS — BP 119/72 | HR 85 | Temp 98.1°F | Resp 16 | Ht 70.0 in | Wt 138.4 lb

## 2022-05-05 DIAGNOSIS — Z8619 Personal history of other infectious and parasitic diseases: Secondary | ICD-10-CM

## 2022-05-05 DIAGNOSIS — Z21 Asymptomatic human immunodeficiency virus [HIV] infection status: Secondary | ICD-10-CM

## 2022-05-05 DIAGNOSIS — K2100 Gastro-esophageal reflux disease with esophagitis, without bleeding: Secondary | ICD-10-CM

## 2022-05-05 DIAGNOSIS — F419 Anxiety disorder, unspecified: Secondary | ICD-10-CM

## 2022-05-05 DIAGNOSIS — F32A Depression, unspecified: Secondary | ICD-10-CM

## 2022-05-05 MED ORDER — MIRTAZAPINE 15 MG PO TABS
15.0000 mg | ORAL_TABLET | Freq: Every day | ORAL | 5 refills | Status: DC
Start: 2022-05-05 — End: 2023-01-19

## 2022-05-05 MED ORDER — HYDROXYZINE HCL 25 MG PO TABS: 25.0000 mg | ORAL_TABLET | Freq: Three times a day (TID) | ORAL | 5 refills | Status: DC | PRN

## 2022-05-05 MED ORDER — TIVICAY 50 MG PO TABS: 50.0000 mg | ORAL_TABLET | Freq: Every day | ORAL | 11 refills | Status: DC

## 2022-05-05 MED ORDER — DESCOVY 200-25 MG PO TABS: 1.0000 | ORAL_TABLET | Freq: Every day | ORAL | 11 refills | Status: DC

## 2022-05-05 NOTE — Progress Notes (Signed)
Name: Kelly Knight  DOB: 1996/08/21 MRN: [id number] PCP: Patient, No Pcp Per    Brief Narrative:  EMRIC Knight is a 26 y.o. male with HIV disease, Dx 10/2018. CD4 nadir unknown.  HIV Risk: MSM History of OIs: none Intake Labs 01/2019: Hep B sAg (-) sAb (-)  cAb (-); hep A (+); hep c Ab (-) VL 223,000 CD4  RPR 1:2056 --> 1:128 --> 1:64  Quantiferon (-) HLA*B 5701 (-)   Previous Regimens: Tivicay + Descovy (needs crushable regimen)   Genotypes: 01/2019: [id number]   Subjective:   Chief Complaint  Patient presents with   Follow-up    B20 -     HPI: Kelly Knight is here for routine follow up. Has been getting in tivicay + descovy everyday. Needs refills of Remeron - likes the dose of 15 mg QHS Has not had a lot of nausea like before. Will need some new Hydroxyzine as well. Has been to ER in [place] twice now for panic attacks. Prior to moving back here to [place] he was participating in support groups and walking his dog with them; found that helpful. Ultimate goal is to get back to [place] and back to college to finish up his cultural arts/theater degree.    Still on GERD reducing medications. Taking Prilosec as needed and nutrition modifications.  This is way better.  Wt Readings from Last 3 Encounters:  05/05/22 138 lb 6.4 oz (62.8 kg)  10/29/21 142 lb (64.4 kg)  06/03/21 133 lb (60.3 kg)     Review of Systems  Constitutional:  Negative for appetite change, chills, fatigue, fever and unexpected weight change.  Eyes:  Negative for visual disturbance.  Respiratory:  Negative for cough and shortness of breath.   Cardiovascular:  Negative for chest pain and leg swelling.  Gastrointestinal:  Negative for abdominal pain, diarrhea and nausea.  Genitourinary:  Negative for dysuria, genital sores and penile discharge.  Musculoskeletal:  Negative for joint swelling.  Skin:  Negative for color change and rash.  Neurological:  Negative for dizziness and headaches.   Hematological:  Negative for adenopathy.  Psychiatric/Behavioral:  Positive for sleep disturbance. The patient is nervous/anxious.      Past Medical History:  Diagnosis Date   Asthma    Heart murmur    HIV (human immunodeficiency virus infection) ([place]) 01/23/2019   Situs inversus    Syphilis 01/23/2019    Outpatient Medications Prior to Visit  Medication Sig Dispense Refill   acetaminophen (TYLENOL) 500 MG tablet Take 500 mg by mouth every 6 (six) hours as needed (severe migraine).     albuterol (PROVENTIL) (2.5 MG/3ML) 0.083% nebulizer solution Take 2.5 mg by nebulization every 6 (six) hours as needed for wheezing.     albuterol (VENTOLIN HFA) 108 (90 Base) MCG/ACT inhaler Inhale 2 puffs into the lungs every 6 (six) hours as needed for wheezing or shortness of breath. 18 g 6   aspirin-acetaminophen-caffeine (EXCEDRIN MIGRAINE) 250-250-65 MG tablet Take 2 tablets by mouth 3 (three) times daily as needed for headache or migraine.     omeprazole (PRILOSEC) 20 MG capsule Take 1 capsule (20 mg total) by mouth daily. 30 capsule 2   ondansetron (ZOFRAN ODT) 4 MG disintegrating tablet Take 1 tablet (4 mg total) by mouth every 8 (eight) hours as needed for nausea or vomiting. 20 tablet 0   dolutegravir (TIVICAY) 50 MG tablet Take 1 tablet (50 mg total) by mouth daily after supper. 30 tablet 5   emtricitabine-tenofovir AF (  DESCOVY) 200-25 MG tablet Take 1 tablet by mouth daily after supper. 30 tablet 5   hydrOXYzine (ATARAX/VISTARIL) 25 MG tablet Take 1 tablet (25 mg total) by mouth 3 (three) times daily as needed. 60 tablet 0   mirtazapine (REMERON) 15 MG tablet Take 1 tablet (15 mg total) by mouth at bedtime. 30 tablet 5   No facility-administered medications prior to visit.     Allergies  Allergen Reactions   Shrimp [Shellfish Allergy] Itching and Swelling    Throat swelling   Sulfa Antibiotics Rash    Social History   Tobacco Use   Smoking status: Former    Types: Cigarettes    Smokeless tobacco: Never   Tobacco comments:    Stopped Air traffic controller Use: Former   Substances: THC  Substance Use Topics   Alcohol use: Not Currently    Comment: socially    Drug use: Not Currently    Frequency: 7.0 times per week    Types: Marijuana    Social History   Substance and Sexual Activity  Sexual Activity Not Currently   Partners: Male   Comment: declined condoms     Objective:   Vitals:   05/05/22 0955  BP: 119/72  Pulse: 85  Resp: 16  Temp: 98.1 F (36.7 C)  TempSrc: Oral  SpO2: 99%  Weight: 138 lb 6.4 oz (62.8 kg)  Height: 5' 10" (1.778 m)   Body mass index is 19.86 kg/m.  Physical Exam Vitals reviewed.  Constitutional:      Appearance: He is well-developed.     Comments: Seated comfortably in chair during visit.   HENT:     Mouth/Throat:     Dentition: Normal dentition. No dental abscesses.  Cardiovascular:     Rate and Rhythm: Normal rate and regular rhythm.     Heart sounds: Normal heart sounds.  Pulmonary:     Effort: Pulmonary effort is normal.     Breath sounds: Normal breath sounds.  Abdominal:     General: There is no distension.     Palpations: Abdomen is soft.     Tenderness: There is no abdominal tenderness.  Lymphadenopathy:     Cervical: No cervical adenopathy.  Skin:    General: Skin is warm and dry.     Findings: No rash.  Neurological:     Mental Status: He is alert and oriented to person, place, and time.  Psychiatric:        Judgment: Judgment normal.     Lab Results Lab Results  Component Value Date   WBC 4.9 05/19/2021   HGB 15.6 05/19/2021   HCT 47.0 05/19/2021   MCV 88.5 05/19/2021   PLT 258 05/19/2021    Lab Results  Component Value Date   CREATININE 1.09 05/19/2021   BUN 10 05/19/2021   NA 133 (L) 05/19/2021   K 3.8 05/19/2021   CL 98 05/19/2021   CO2 20 (L) 05/19/2021    Lab Results  Component Value Date   ALT 11 05/19/2021   AST 17 05/19/2021   ALKPHOS 65 05/19/2021    BILITOT 0.7 05/19/2021    Lab Results  Component Value Date   CHOL 131 01/17/2019   HDL 31 (L) 01/17/2019   LDLCALC 84 01/17/2019   TRIG 69 01/17/2019   CHOLHDL 4.2 01/17/2019   HIV 1 RNA Quant (Copies/mL)  Date Value  10/29/2021 24 (H)  06/03/2021 104,000 (H)  12/14/2020 23 (H)   CD4 T Cell  Abs (/uL)  Date Value  06/03/2021 228 (L)  12/14/2020 389 (L)  06/02/2020 304 (L)     Assessment & Plan:   Problem List Items Addressed This Visit       High   HIV (human immunodeficiency virus infection) ([place]) (Chronic)    Very well controlled on once daily Tivicay + Descovy crushed together daily. No concerns with access or adherence to medication. They are tolerating the medication well without side effects. No drug interactions identified.  Reminded about ADAP re-enrollment in July / January - he has an appt today at 10:30 No dental needs today.  Mental health addressed - see problem based charting Sexual health discussed including screening needs - urine cytology and RPR today    Return in about 3 months (around 08/05/2022).        Relevant Medications   dolutegravir (TIVICAY) 50 MG tablet   emtricitabine-tenofovir AF (DESCOVY) 200-25 MG tablet   Other Relevant Orders   Urine cytology ancillary only   HIV 1 RNA quant-no reflex-bld   T-helper cells (CD4) count   COMPLETE METABOLIC PANEL WITH GFR   CBC     Unprioritized   History of syphilis - Primary    Last treated in [place] with 1 round of bicillin January/February 2023 - follow up titer today.       Relevant Orders   RPR   GERD with esophagitis    Stable on current medications and nutrition modification. Refills of PPI can be given.  Remeron also has helped with associated nausea.       Anxiety and depression    Refills provided for Remeron 15 mg QHS - can increase if needed at return in 27m  Refills provided for PRN Hydroxyzine tabs.   Ref[place] back [place] counseling services [place]  Spent time discussing short term goals for plans of 2 major stressors - housing and schooling.   RTC in 35mor follow up.       Relevant Medications   hydrOXYzine (ATARAX) 25 MG tablet   mirtazapine (REMERON) 15 MG tablet    StJanene MadeiraMSN, NP-C ReNov[facility] Centeror Infectious Disease [email or web address] Pager: [phone]: [phone] 05/05/22  10:49 AM

## 2022-05-05 NOTE — Assessment & Plan Note (Signed)
Last treated in [place] with 1 round of bicillin January/February 2023 - follow up titer today.

## 2022-05-05 NOTE — Assessment & Plan Note (Signed)
Stable on current medications and nutrition modification. Refills of PPI can be given.  Remeron also has helped with associated nausea.

## 2022-05-05 NOTE — Assessment & Plan Note (Signed)
Refills provided for Remeron 15 mg QHS - can increase if needed at return in 57m.  Refills provided for PRN Hydroxyzine tabs.   Referred back to counseling services while he is here in [place]. Spent time discussing short term goals for plans of 2 major stressors - housing and schooling.   RTC in 82m for follow up.

## 2022-05-05 NOTE — Assessment & Plan Note (Signed)
Very well controlled on once daily Tivicay + Descovy crushed together daily. No concerns with access or adherence to medication. They are tolerating the medication well without side effects. No drug interactions identified.  Reminded about ADAP re-enrollment in July / January - he has an appt today at 10:30 No dental needs today.  Mental health addressed - see problem based charting Sexual health discussed including screening needs - urine cytology and RPR today    Return in about 3 months (around 08/05/2022).

## 2022-05-05 NOTE — Patient Instructions (Addendum)
Continue your Tivicay and Descovy.   Start counseling sessions again while you are here - Okey Regal is here Tuesdays.   [employer] - this is a nice   [id number] - will get you 50% off beginner MAPS program through the end of the month  MAPS Starter.   Will see you again in October to follow up on how you are doing

## 2022-05-06 LAB — T-HELPER CELLS (CD4) COUNT (NOT AT ARMC)
CD4 % Helper T Cell: 16 % — ABNORMAL LOW (ref 33–65)
CD4 T Cell Abs: 228 /uL — ABNORMAL LOW (ref 400–1790)

## 2022-05-06 LAB — URINE CYTOLOGY ANCILLARY ONLY
Chlamydia: NEGATIVE
Comment: NEGATIVE
Comment: NORMAL
Neisseria Gonorrhea: NEGATIVE

## 2022-05-09 LAB — COMPLETE METABOLIC PANEL WITH GFR
AG Ratio: 1 (calc) (ref 1.0–2.5)
ALT: 8 U/L — ABNORMAL LOW (ref 9–46)
AST: 15 U/L (ref 10–40)
Albumin: 4.2 g/dL (ref 3.6–5.1)
Alkaline phosphatase (APISO): 71 U/L (ref 36–130)
BUN: 13 mg/dL (ref 7–25)
CO2: 26 mmol/L (ref 20–32)
Calcium: 9.4 mg/dL (ref 8.6–10.3)
Chloride: 105 mmol/L (ref 98–110)
Creat: 0.92 mg/dL (ref 0.60–1.24)
Globulin: 4.1 g/dL (calc) — ABNORMAL HIGH (ref 1.9–3.7)
Glucose, Bld: 83 mg/dL (ref 65–99)
Potassium: 3.9 mmol/L (ref 3.5–5.3)
Sodium: 136 mmol/L (ref 135–146)
Total Bilirubin: 0.4 mg/dL (ref 0.2–1.2)
Total Protein: 8.3 g/dL — ABNORMAL HIGH (ref 6.1–8.1)
eGFR: 118 mL/min/{1.73_m2} (ref >=60)

## 2022-05-09 LAB — CBC
HCT: 42.1 % (ref 38.5–50.0)
Hemoglobin: 14 g/dL (ref 13.2–17.1)
MCH: 28.1 pg (ref 27.0–33.0)
MCHC: 33.3 g/dL (ref 32.0–36.0)
MCV: 84.4 fL (ref 80.0–100.0)
MPV: 10.4 fL (ref 7.5–12.5)
Platelets: 190 10*3/uL (ref 140–400)
RBC: 4.99 10*6/uL (ref 4.20–5.80)
RDW: 14.1 % (ref 11.0–15.0)
WBC: 3.8 10*3/uL (ref 3.8–10.8)

## 2022-05-09 LAB — RPR TITER: RPR Titer: 1:64 {titer} — ABNORMAL HIGH

## 2022-05-09 LAB — RPR: RPR Ser Ql: REACTIVE — AB

## 2022-05-09 LAB — FLUORESCENT TREPONEMAL AB(FTA)-IGG-BLD: Fluorescent Treponemal ABS: REACTIVE — AB

## 2022-05-09 LAB — HIV-1 RNA QUANT-NO REFLEX-BLD
HIV 1 RNA Quant: 90400 Copies/mL — ABNORMAL HIGH
HIV-1 RNA Quant, Log: 4.96 Log cps/mL — ABNORMAL HIGH

## 2022-05-24 ENCOUNTER — Ambulatory Visit: Payer: Self-pay

## 2022-05-24 ENCOUNTER — Other Ambulatory Visit: Payer: Self-pay

## 2022-05-24 NOTE — Progress Notes (Unsigned)
Therapist met with client as scheduled to continue rapport building. Therapist introduced and shared some information about herself and encouraged the same from client in order to build trust and openness within the counseling relationship. Therapist discussed and reviewed treatment plans with client based on the result from client's assessment. Therapist discussed client current mental health symptoms /diagnosis of DSM-5, as well as recommendations and target population for various eligibility services. Therapist assessed for SI/HI during session and will follow-up with client during the next session.  Client attended session with therapist as scheduled and presented alert; orient X4. On start of session, client affect appeared euthymic; affect was congruent with client report. Appearance was relaxed/casual, and attitude was appropriate. Client was receptive to rapport building by sharing information about him/herself, such as: expressing likes, dislikes, and strengths. Client was able to make connections between consequences, challenges and alternative thoughts of mental health recovery. Client talked about how he and his current boyfriend became homeless, now living in his father's house. Client informed clinician that he is not going to ask where the rent money had gone and why it wasn't paid as he feels this would cause more problems in their relationship. Client informed clinician that he is currently working along with his current boyfriend as they are taking care of both his parents at the present moment. Client progress toward therapeutic goal(s) are minimal at this time. Client denied SI/HI.

## 2022-06-14 ENCOUNTER — Ambulatory Visit: Payer: Medicaid Other

## 2022-07-27 ENCOUNTER — Ambulatory Visit
Admission: RE | Admit: 2022-07-27 | Discharge: 2022-07-27 | Disposition: A | Discharge status: Home / Self Care | Payer: No Typology Code available for payment source | Source: Ambulatory Visit | Attending: Family Medicine | Admitting: Family Medicine

## 2022-07-27 VITALS — BP 102/68 | HR 76 | Temp 98.6°F | Resp 18

## 2022-07-27 DIAGNOSIS — H1031 Unspecified acute conjunctivitis, right eye: Secondary | ICD-10-CM

## 2022-07-27 MED ORDER — POLYMYXIN B-TRIMETHOPRIM 10000-0.1 UNIT/ML-% OP SOLN: 1.0000 [drp] | OPHTHALMIC | 0 refills | Status: DC

## 2022-07-27 NOTE — ED Triage Notes (Signed)
Pt reports pain, redness and drainage in right eye x 1 day.

## 2022-07-27 NOTE — ED Provider Notes (Signed)
[facility]    CSN: [id number] Arrival date & time: 07/27/22  1313      History   Chief Complaint Chief Complaint  Patient presents with   Appointment    1330   Eye Problem   HPI Kelly Knight is a 26 y.o. male.   Presenting today with 1 day history of right eye redness, drainage, itching.  Denies visual change, injury to the eye, fever, chills, headache, nausea, vomiting.  So far not tried anything over-the-counter for symptoms other than covering it with an eye patch due to light sensitivity.   Past Medical History:  Diagnosis Date   Asthma    Heart murmur    HIV (human immunodeficiency virus infection) ([facility]) 01/23/2019   Situs inversus    Syphilis 01/23/2019   Patient Active Problem List   Diagnosis Date Noted   Uncomplicated asthma 10/29/2021   Situs inversus abdominalis 05/17/2021   GERD with esophagitis 01/21/2021   Environmental allergies 07/05/2020   Anxiety and depression 05/10/2019   HIV (human immunodeficiency virus infection) (HCC) 01/23/2019   History of syphilis 01/23/2019    History reviewed. No pertinent surgical history.     Home Medications    Prior to Admission medications   Medication Sig Start Date End Date Taking? Authorizing Provider  trimethoprim-polymyxin b (POLYTRIM) ophthalmic solution Place 1 drop into the right eye every 4 (four) hours. 07/27/22  Yes Particia Nearing, PA-C  acetaminophen (TYLENOL) 500 MG tablet Take 500 mg by mouth every 6 (six) hours as needed (severe migraine).    [provider]  albuterol (PROVENTIL) (2.5 MG/3ML) 0.083% nebulizer solution Take 2.5 mg by nebulization every 6 (six) hours as needed for wheezing.    [provider]  albuterol (VENTOLIN HFA) 108 (90 Base) MCG/ACT inhaler Inhale 2 puffs into the lungs every 6 (six) hours as needed for wheezing or shortness of breath. 10/29/21   Blanchard Kelch, NP  aspirin-acetaminophen-caffeine (EXCEDRIN MIGRAINE) [phone] MG  tablet Take 2 tablets by mouth 3 (three) times daily as needed for headache or migraine.    [provider]  dolutegravir (TIVICAY) 50 MG tablet Take 1 tablet (50 mg total) by mouth daily after supper. 05/05/22   Blanchard Kelch, NP  emtricitabine-tenofovir AF (DESCOVY) 200-25 MG tablet Take 1 tablet by mouth daily after supper. 05/05/22   Blanchard Kelch, NP  hydrOXYzine (ATARAX) 25 MG tablet Take 1 tablet (25 mg total) by mouth 3 (three) times daily as needed. 05/05/22   Blanchard Kelch, NP  mirtazapine (REMERON) 15 MG tablet Take 1 tablet (15 mg total) by mouth at bedtime. 05/05/22   Blanchard Kelch, NP  omeprazole (PRILOSEC) 20 MG capsule Take 1 capsule (20 mg total) by mouth daily. 10/29/21   Blanchard Kelch, NP  ondansetron (ZOFRAN ODT) 4 MG disintegrating tablet Take 1 tablet (4 mg total) by mouth every 8 (eight) hours as needed for nausea or vomiting. 06/03/21   Blanchard Kelch, NP    Family History Family History  Problem Relation Age of Onset   Diabetes Mother    Hypertension Mother    Hypertension Father     Social History Social History   Tobacco Use   Smoking status: Former    Types: Cigarettes   Smokeless tobacco: Never   Tobacco comments:    Stopped Geophysical data processor Use: Former   Substances: THC  Substance Use Topics   Alcohol use: Not Currently  Comment: socially    Drug use: Not Currently    Frequency: 7.0 times per week    Types: Marijuana     Allergies   Shrimp [shellfish allergy] and Sulfa antibiotics   Review of Systems Review of Systems Per HPI  Physical Exam Triage Vital Signs ED Triage Vitals  Enc Vitals Group     BP 07/27/22 1418 102/68     Pulse Rate 07/27/22 1418 76     Resp 07/27/22 1418 18     Temp 07/27/22 1418 98.6 F (37 C)     Temp Source 07/27/22 1418 Oral     SpO2 07/27/22 1418 96 %     Weight --      Height --      Head Circumference --      Peak Flow --      Pain Score 07/27/22 1417 5      Pain Loc --      Pain Edu? --      Excl. in GC? --    No data found.  Updated Vital Signs BP 102/68 (BP Location: Right Arm)   Pulse 76   Temp 98.6 F (37 C) (Oral)   Resp 18   SpO2 96%   Visual Acuity Right Eye Distance:   Left Eye Distance:   Bilateral Distance:    Right Eye Near:   Left Eye Near:    Bilateral Near:     Physical Exam Vitals and nursing note reviewed.  Constitutional:      Appearance: Normal appearance.  HENT:     Head: Atraumatic.     Nose: Nose normal.     Mouth/Throat:     Mouth: Mucous membranes are moist.  Eyes:     Extraocular Movements: Extraocular movements intact.     Pupils: Pupils are equal, round, and reactive to light.     Comments: Right Conjunctiva erythematous, thick drainage present  Cardiovascular:     Rate and Rhythm: Normal rate and regular rhythm.  Pulmonary:     Effort: Pulmonary effort is normal.     Breath sounds: Normal breath sounds.  Musculoskeletal:        General: Normal range of motion.     Cervical back: Normal range of motion and neck supple.  Skin:    General: Skin is warm and dry.  Neurological:     General: No focal deficit present.     Mental Status: He is oriented to person, place, and time.  Psychiatric:        Mood and Affect: Mood normal.        Thought Content: Thought content normal.        Judgment: Judgment normal.      UC Treatments / Results  Labs (all labs ordered are listed, but only abnormal results are displayed) Labs Reviewed - No data to display  EKG   Radiology No results found.  Procedures Procedures (including critical care time)  Medications Ordered in UC Medications - No data to display  Initial Impression / Assessment and Plan / UC Course  I have reviewed the triage vital signs and the nursing notes.  Pertinent labs & imaging results that were available during my care of the patient were reviewed by me and considered in my medical decision making (see chart for  details).     Consistent with conjunctivitis of the right eye.  Treat with Polytrim drops, warm compresses, good handwashing.  Work note given.  Return for worsening symptoms.  Visual acuity declined today as vision is intact the patient.  Final Clinical Impressions(s) / UC Diagnoses   Final diagnoses:  Acute bacterial conjunctivitis of right eye   Discharge Instructions   None    ED Prescriptions     Medication Sig Dispense Auth. Provider   trimethoprim-polymyxin b (POLYTRIM) ophthalmic solution Place 1 drop into the right eye every 4 (four) hours. 10 mL Volney American, [place]      PDMP not reviewed this encounter.   Volney American, [place] 07/27/22 1430

## 2022-07-31 ENCOUNTER — Emergency Department (HOSPITAL_COMMUNITY)
Admission: EM | Admit: 2022-07-31 | Discharge: 2022-07-31 | Discharge status: Against Medical Advice | Payer: No Typology Code available for payment source

## 2022-08-04 ENCOUNTER — Ambulatory Visit: Payer: Medicaid Other | Admitting: Infectious Diseases

## 2022-08-16 ENCOUNTER — Ambulatory Visit (HOSPITAL_COMMUNITY)
Admission: EM | Admit: 2022-08-16 | Discharge: 2022-08-16 | Disposition: A | Discharge status: Home / Self Care | Payer: No Typology Code available for payment source

## 2022-08-16 ENCOUNTER — Encounter (HOSPITAL_COMMUNITY): Payer: Self-pay | Admitting: Registered Nurse

## 2022-08-16 ENCOUNTER — Telehealth (HOSPITAL_COMMUNITY): Payer: Self-pay | Admitting: Psychiatry

## 2022-08-16 DIAGNOSIS — R45851 Suicidal ideations: Secondary | ICD-10-CM

## 2022-08-16 DIAGNOSIS — F4323 Adjustment disorder with mixed anxiety and depressed mood: Secondary | ICD-10-CM

## 2022-08-16 NOTE — ED Notes (Signed)
Patient discharged by provider Shuvon Rankins, NP with written and verbal instructions.  

## 2022-08-16 NOTE — ED Provider Notes (Signed)
[facility] Medical Screening Exam  Patient Name: Kelly Knight MRN: [id number] Date of Evaluation: 08/16/22 Chief Complaint:   Diagnosis:  Final diagnoses:  Adjustment disorder with mixed anxiety and depressed mood  Passive suicidal ideations    History of Present illness: Kelly Knight is a 26 y.o. male patient presented to [facility] as a walk in with complaints of depression, worsening anxiety, and passive suicidal thoughts  Kelly Knight, 26 y.o., male patient seen face to face by this provider, consulted with Dr. Hampton Abbot; and chart reviewed on 08/16/22.  On evaluation Kelly Knight reports he is home taking care of his mother, and working full time.  He feels that all responsibility falls on him and not other siblings.  Reports he moved home to take care of his mother after a stoke, his father is in long term rehab, younger brother in college, and sister in [place].  States he feels overwhelmed, and that he has no life.  Reports passive suicidal thoughts with no plan or intent.  Denies prior suicide attempt.  Reports his grandfather has been supportive has removed anything from the home that can be used to harm himself with.  Reports his grandfather has temporarily taking over care of his mother until he is able to talk to someone.  Patient reports that his suicidal thoughts have been more passive such as not wanting to wake and nobody would care if he was gone.  Patient reports he does not actually want to die.  Patient also denies homicidal ideation, psychosis, paranoia. During evaluation Kelly Knight is sitting in chair with no noted distress. He is alert/oriented x 4; calm/cooperative; and mood congruent with affect.  He is speaking in a clear tone at moderate volume, and normal pace; with good eye contact.  His thought process is coherent and relevant; There is no indication that he is currently responding to internal/external stimuli or experiencing  delusional thought content; and he denies active suicidal ideation, self-harm/homicidal ideation, psychosis, and paranoia.   Patient has remained calm throughout assessment and has answered questions appropriately.   Safety plan discussed.  Patient is interested in intensive outpatient program.    Psychiatric Specialty Exam  Presentation  General Appearance:Appropriate for Environment  Eye Contact:Good  Speech:Clear and Coherent; Normal Rate  Speech Volume:Normal  Handedness:Right   Mood and Affect  Mood: Anxious; Depressed  Affect: Congruent   Thought Process  Thought Processes: Coherent; Goal Directed  Descriptions of Associations:Intact  Orientation:Full (Time, Place and Person)  Thought Content:Logical    Hallucinations:None  Ideas of Reference:None  Suicidal Thoughts:Yes, Passive Without Intent; Without Plan  Homicidal Thoughts:No   Sensorium  Memory: Immediate Good; Recent Good; Remote Good  Judgment: Intact  Insight: Present   Executive Functions  Concentration: Good  Attention Span: Good  Recall: Good  Fund of Knowledge: Good  Language: Good   Psychomotor Activity  Psychomotor Activity: Normal   Assets  Assets: Communication Skills; Desire for Improvement; Financial Resources/Insurance; Housing; Physical Health; Social Support; Resilience   Sleep  Sleep: Fair  Number of hours: No data recorded  Nutritional Assessment (For OBS and FBC admissions only) Has the patient had a weight loss or gain of 10 pounds or more in the last 3 months?: Yes Has the patient had a decrease in food intake/or appetite?: Yes Does the patient have dental problems?: No Does the patient have eating habits or behaviors that may be indicators of an eating disorder including binging or  inducing vomiting?: No Has the patient recently lost weight without trying?: 1 Has the patient been eating poorly because of a decreased appetite?:  0 Malnutrition Screening Tool Score: 1    Physical Exam: Physical Exam Vitals and nursing note reviewed. Exam conducted with a chaperone present.  Constitutional:      General: He is not in acute distress.    Appearance: Normal appearance. He is not ill-appearing.  HENT:     Head: Normocephalic.  Eyes:     Pupils: Pupils are equal, round, and reactive to light.  Cardiovascular:     Rate and Rhythm: Normal rate.  Pulmonary:     Effort: Pulmonary effort is normal.  Musculoskeletal:        General: Normal range of motion.     Cervical back: Normal range of motion.  Skin:    General: Skin is warm and dry.  Neurological:     Mental Status: He is alert and oriented to person, place, and time.  Psychiatric:        Attention and Perception: Attention and perception normal. He does not perceive auditory or visual hallucinations.        Mood and Affect: Mood is anxious and depressed.        Speech: Speech normal.        Behavior: Behavior normal. Behavior is cooperative.        Thought Content: Thought content is not paranoid or delusional. Thought content does not include homicidal ideation. Suicidal: Passive.Thought content does not include suicidal plan.        Cognition and Memory: Cognition normal.        Judgment: Judgment normal.    Review of Systems  Constitutional: Negative.   HENT: Negative.    Eyes: Negative.   Respiratory: Negative.    Cardiovascular: Negative.   Gastrointestinal: Negative.   Genitourinary: Negative.   Musculoskeletal: Negative.   Skin: Negative.   Neurological: Negative.   Endo/Heme/Allergies: Negative.   Psychiatric/Behavioral:  Positive for depression. Hallucinations: Denies. Suicidal ideas: Passive thoughts.The patient is nervous/anxious.    Blood pressure 125/82, pulse 60, temperature 98.5 F (36.9 C), resp. rate 18, SpO2 100 %. There is no height or weight on file to calculate BMI.  Musculoskeletal: Strength & Muscle Tone: within normal  limits Gait & Station: normal Patient leans: N/A   [name] Discharge Disposition for Follow up and Recommendations: Based on my evaluation the patient does not appear to have an emergency medical condition and can be discharged with resources and follow up care in outpatient services for Medication Management, Individual Therapy, and IOP   Jerin Franzel, NP 08/16/2022, 1:34 PM

## 2022-08-23 ENCOUNTER — Telehealth (HOSPITAL_COMMUNITY): Payer: Self-pay | Admitting: Psychiatry

## 2022-08-23 ENCOUNTER — Other Ambulatory Visit (HOSPITAL_COMMUNITY): Payer: No Typology Code available for payment source | Attending: Psychiatry | Admitting: Psychiatry

## 2022-08-24 ENCOUNTER — Ambulatory Visit (HOSPITAL_COMMUNITY): Payer: No Typology Code available for payment source | Admitting: Licensed Clinical Social Worker

## 2022-08-25 ENCOUNTER — Ambulatory Visit (HOSPITAL_COMMUNITY): Payer: No Typology Code available for payment source | Admitting: Licensed Clinical Social Worker

## 2022-08-26 ENCOUNTER — Other Ambulatory Visit: Payer: Self-pay

## 2022-08-26 ENCOUNTER — Ambulatory Visit (HOSPITAL_COMMUNITY): Payer: No Typology Code available for payment source | Admitting: Licensed Clinical Social Worker

## 2022-08-26 ENCOUNTER — Encounter (HOSPITAL_BASED_OUTPATIENT_CLINIC_OR_DEPARTMENT_OTHER): Payer: Self-pay | Admitting: Emergency Medicine

## 2022-08-26 ENCOUNTER — Emergency Department (HOSPITAL_BASED_OUTPATIENT_CLINIC_OR_DEPARTMENT_OTHER)
Admission: EM | Admit: 2022-08-26 | Discharge: 2022-08-26 | Disposition: A | Discharge status: Home / Self Care | Payer: No Typology Code available for payment source | Attending: Emergency Medicine | Admitting: Emergency Medicine

## 2022-08-26 DIAGNOSIS — Z7982 Long term (current) use of aspirin: Secondary | ICD-10-CM | POA: Insufficient documentation

## 2022-08-26 DIAGNOSIS — K0889 Other specified disorders of teeth and supporting structures: Secondary | ICD-10-CM | POA: Diagnosis present

## 2022-08-26 MED ORDER — AMOXICILLIN 400 MG/5ML PO SUSR: 500.0000 mg | Freq: Three times a day (TID) | ORAL | 0 refills | Status: DC

## 2022-08-26 NOTE — ED Triage Notes (Signed)
Pt arrives ambulatory to triage, endorse "8th day of a toothache.Reports that gums around wisdom teeth hurt. Denies injury

## 2022-08-26 NOTE — Discharge Instructions (Addendum)
You were seen in the emergency department for pain in your molar teeth.  This is probably due to your wisdom teeth erupting.  You can do Tylenol and ibuprofen for pain.  Ice to the affected areas.  We are covering you with some antibiotics for possible infection.  Please contact Dr. Leanord Asal dental for follow-up.

## 2022-08-26 NOTE — ED Provider Notes (Signed)
[facility] EMERGENCY DEPT Provider Note   CSN: [id number] Arrival date & time: 08/26/22  2353     History  Chief Complaint  Patient presents with   Dental Pain    Kelly Knight is a 26 y.o. male.  He is here with complaint of dental pain its been going on for about a week.  He said it is located his molars on both sides.  He has known wisdom teeth that have given him troubles in the past but he has not elected to get them taken out.  He thinks he might of had a fever.  He does not have a Education officer, community or dental insurance.  Allergy to sulfa.  Cannot take pills.  The history is provided by the patient.  Dental Pain Location:  Upper and lower Upper teeth location:  1/RU 3rd molar and 16/LU 3rd molar Lower teeth location:  17/LL 3rd molar and 32/RL 3rd molar Quality:  Aching Onset quality:  Gradual Duration:  1 week Timing:  Constant Progression:  Unchanged Chronicity:  Recurrent Context: not trauma   Relieved by:  None tried Worsened by:  Jaw movement Associated symptoms: fever and gum swelling   Associated symptoms: no difficulty swallowing, no oral bleeding and no trismus        Home Medications Prior to Admission medications   Medication Sig Start Date End Date Taking? Authorizing Provider  acetaminophen (TYLENOL) 500 MG tablet Take 500 mg by mouth every 6 (six) hours as needed (severe migraine).    [provider]  albuterol (PROVENTIL) (2.5 MG/3ML) 0.083% nebulizer solution Take 2.5 mg by nebulization every 6 (six) hours as needed for wheezing.    [provider]  albuterol (VENTOLIN HFA) 108 (90 Base) MCG/ACT inhaler Inhale 2 puffs into the lungs every 6 (six) hours as needed for wheezing or shortness of breath. 10/29/21   Blanchard Kelch, NP  aspirin-acetaminophen-caffeine (EXCEDRIN MIGRAINE) [phone] MG tablet Take 2 tablets by mouth 3 (three) times daily as needed for headache or migraine.    [provider]  dolutegravir  (TIVICAY) 50 MG tablet Take 1 tablet (50 mg total) by mouth daily after supper. 05/05/22   Blanchard Kelch, NP  emtricitabine-tenofovir AF (DESCOVY) 200-25 MG tablet Take 1 tablet by mouth daily after supper. 05/05/22   Blanchard Kelch, NP  hydrOXYzine (ATARAX) 25 MG tablet Take 1 tablet (25 mg total) by mouth 3 (three) times daily as needed. 05/05/22   Blanchard Kelch, NP  mirtazapine (REMERON) 15 MG tablet Take 1 tablet (15 mg total) by mouth at bedtime. 05/05/22   Blanchard Kelch, NP  omeprazole (PRILOSEC) 20 MG capsule Take 1 capsule (20 mg total) by mouth daily. 10/29/21   Blanchard Kelch, NP  ondansetron (ZOFRAN ODT) 4 MG disintegrating tablet Take 1 tablet (4 mg total) by mouth every 8 (eight) hours as needed for nausea or vomiting. 06/03/21   Blanchard Kelch, NP  trimethoprim-polymyxin b (POLYTRIM) ophthalmic solution Place 1 drop into the right eye every 4 (four) hours. 07/27/22   Particia Nearing, PA-C      Allergies    Shrimp [shellfish allergy] and Sulfa antibiotics    Review of Systems   Review of Systems  Constitutional:  Positive for fever.  HENT:  Positive for dental problem. Negative for trouble swallowing.   Respiratory:  Negative for shortness of breath.     Physical Exam Updated Vital Signs BP 129/80   Pulse 98   Temp 98.6  F (37 C)   Resp 18   Ht 5\' 10"  (1.778 m)   Wt 61.2 kg   SpO2 99%   BMI 19.37 kg/m  Physical Exam Vitals and nursing note reviewed.  Constitutional:      Appearance: Normal appearance. He is well-developed.  HENT:     Head: Normocephalic and atraumatic.     Comments: He has fair dentition.  There are some erupting molars with some gingival erythema.  No abscess identified.  No trismus.  No malocclusion Eyes:     Conjunctiva/sclera: Conjunctivae normal.  Pulmonary:     Effort: Pulmonary effort is normal.  Musculoskeletal:     Cervical back: Neck supple.  Skin:    General: Skin is warm and dry.  Neurological:      Mental Status: He is alert.     GCS: GCS eye subscore is 4. GCS verbal subscore is 5. GCS motor subscore is 6.     ED Results / Procedures / Treatments   Labs (all labs ordered are listed, but only abnormal results are displayed) Labs Reviewed - No data to display  EKG None  Radiology No results found.  Procedures Procedures    Medications Ordered in ED Medications - No data to display  ED Course/ Medical Decision Making/ A&P                           Medical Decision Making Risk Prescription drug management.   26 year old male here with dental pain.  Differential includes fracture, infection, abscess, deep space infection.  Clinically patient has erupting molars.  No definite infection but will cover with antibiotics.  Given dental contact information.  Return instructions discussed        Final Clinical Impression(s) / ED Diagnoses Final diagnoses:  Pain, dental    Rx / DC Orders ED Discharge Orders          Ordered    amoxicillin (AMOXIL) 400 MG/5ML suspension  3 times daily        08/26/22 0805              08/28/22, MD 08/26/22 1712

## 2022-08-29 ENCOUNTER — Ambulatory Visit (HOSPITAL_COMMUNITY): Payer: No Typology Code available for payment source | Admitting: Licensed Clinical Social Worker

## 2022-08-30 ENCOUNTER — Ambulatory Visit (HOSPITAL_COMMUNITY): Payer: No Typology Code available for payment source | Admitting: Licensed Clinical Social Worker

## 2022-08-31 ENCOUNTER — Ambulatory Visit (HOSPITAL_COMMUNITY): Payer: No Typology Code available for payment source | Admitting: Licensed Clinical Social Worker

## 2022-09-05 ENCOUNTER — Ambulatory Visit (HOSPITAL_COMMUNITY): Payer: No Typology Code available for payment source | Admitting: Licensed Clinical Social Worker

## 2022-09-06 ENCOUNTER — Ambulatory Visit (HOSPITAL_COMMUNITY): Payer: No Typology Code available for payment source | Admitting: Licensed Clinical Social Worker

## 2022-09-07 ENCOUNTER — Ambulatory Visit (HOSPITAL_COMMUNITY): Payer: No Typology Code available for payment source | Admitting: Licensed Clinical Social Worker

## 2022-09-08 ENCOUNTER — Ambulatory Visit (HOSPITAL_COMMUNITY): Payer: No Typology Code available for payment source | Admitting: Licensed Clinical Social Worker

## 2022-09-09 ENCOUNTER — Ambulatory Visit (HOSPITAL_COMMUNITY): Payer: No Typology Code available for payment source | Admitting: Licensed Clinical Social Worker

## 2022-09-12 ENCOUNTER — Ambulatory Visit (HOSPITAL_COMMUNITY): Payer: No Typology Code available for payment source | Admitting: Licensed Clinical Social Worker

## 2022-09-13 ENCOUNTER — Ambulatory Visit (HOSPITAL_COMMUNITY): Payer: No Typology Code available for payment source | Admitting: Licensed Clinical Social Worker

## 2022-09-14 ENCOUNTER — Ambulatory Visit (HOSPITAL_COMMUNITY): Payer: No Typology Code available for payment source | Admitting: Licensed Clinical Social Worker

## 2022-09-15 ENCOUNTER — Ambulatory Visit (HOSPITAL_COMMUNITY): Payer: No Typology Code available for payment source | Admitting: Licensed Clinical Social Worker

## 2022-09-16 ENCOUNTER — Ambulatory Visit (HOSPITAL_COMMUNITY): Payer: No Typology Code available for payment source | Admitting: Licensed Clinical Social Worker

## 2022-10-14 ENCOUNTER — Emergency Department (HOSPITAL_BASED_OUTPATIENT_CLINIC_OR_DEPARTMENT_OTHER)
Admission: EM | Admit: 2022-10-14 | Discharge: 2022-10-14 | Disposition: A | Discharge status: Home / Self Care | Payer: Medicaid Other | Attending: Emergency Medicine | Admitting: Emergency Medicine

## 2022-10-14 ENCOUNTER — Other Ambulatory Visit: Payer: Self-pay

## 2022-10-14 ENCOUNTER — Encounter (HOSPITAL_BASED_OUTPATIENT_CLINIC_OR_DEPARTMENT_OTHER): Payer: Self-pay | Admitting: Emergency Medicine

## 2022-10-14 DIAGNOSIS — Z21 Asymptomatic human immunodeficiency virus [HIV] infection status: Secondary | ICD-10-CM | POA: Insufficient documentation

## 2022-10-14 DIAGNOSIS — K0889 Other specified disorders of teeth and supporting structures: Secondary | ICD-10-CM | POA: Diagnosis present

## 2022-10-14 DIAGNOSIS — Z7982 Long term (current) use of aspirin: Secondary | ICD-10-CM | POA: Diagnosis not present

## 2022-10-14 DIAGNOSIS — K047 Periapical abscess without sinus: Secondary | ICD-10-CM | POA: Diagnosis not present

## 2022-10-14 MED ORDER — OXYCODONE-ACETAMINOPHEN 5-325 MG PO TABS
1.0000 | ORAL_TABLET | Freq: Once | ORAL | Status: AC
  Administered 2022-10-14: 1 via ORAL
  Filled 2022-10-14: qty 1

## 2022-10-14 MED ORDER — AMOXICILLIN-POT CLAVULANATE 400-57 MG/5ML PO SUSR: 875.0000 mg | Freq: Two times a day (BID) | ORAL | 0 refills | Status: AC

## 2022-10-14 NOTE — ED Provider Notes (Addendum)
[facility] EMERGENCY DEPT Provider Note   CSN: [id number] Arrival date & time: 10/14/22  0848     History  Chief Complaint  Patient presents with   Dental Pain    Kelly Knight is a 27 y.o. male with history significant for HIV presents to the ED complaining of lower left dental pain and bleeding.  Patient has been seen for similar problem in the past and states he has been unable to see a dentist due to cost.  Patient does still have his wisdom teeth which have been causing him problems in the past.  He denies purulent drainage from around the tooth.  He states that the pain is into his jaw and is giving him a headache and he believes he has had a fever.  He reports he is compliant with his medications and his last CD4 count was "good".  Denies difficulty swallowing, voice change, facial swelling.        Home Medications Prior to Admission medications   Medication Sig Start Date End Date Taking? Authorizing Provider  amoxicillin-clavulanate (AUGMENTIN) 400-57 MG/5ML suspension Take 10.9 mLs (875 mg total) by mouth 2 (two) times daily for 10 days. 10/14/22 10/24/22 Yes Ziare Cryder R, PA  acetaminophen (TYLENOL) 500 MG tablet Take 500 mg by mouth every 6 (six) hours as needed (severe migraine).    [provider]  albuterol (PROVENTIL) (2.5 MG/3ML) 0.083% nebulizer solution Take 2.5 mg by nebulization every 6 (six) hours as needed for wheezing.    [provider]  albuterol (VENTOLIN HFA) 108 (90 Base) MCG/ACT inhaler Inhale 2 puffs into the lungs every 6 (six) hours as needed for wheezing or shortness of breath. 10/29/21   Blanchard Kelch, NP  aspirin-acetaminophen-caffeine (EXCEDRIN MIGRAINE) [phone] MG tablet Take 2 tablets by mouth 3 (three) times daily as needed for headache or migraine.    [provider]  dolutegravir (TIVICAY) 50 MG tablet Take 1 tablet (50 mg total) by mouth daily after supper. 05/05/22   Blanchard Kelch, NP   emtricitabine-tenofovir AF (DESCOVY) 200-25 MG tablet Take 1 tablet by mouth daily after supper. 05/05/22   Blanchard Kelch, NP  hydrOXYzine (ATARAX) 25 MG tablet Take 1 tablet (25 mg total) by mouth 3 (three) times daily as needed. 05/05/22   Blanchard Kelch, NP  mirtazapine (REMERON) 15 MG tablet Take 1 tablet (15 mg total) by mouth at bedtime. 05/05/22   Blanchard Kelch, NP  omeprazole (PRILOSEC) 20 MG capsule Take 1 capsule (20 mg total) by mouth daily. 10/29/21   Blanchard Kelch, NP  ondansetron (ZOFRAN ODT) 4 MG disintegrating tablet Take 1 tablet (4 mg total) by mouth every 8 (eight) hours as needed for nausea or vomiting. 06/03/21   Blanchard Kelch, NP  trimethoprim-polymyxin b (POLYTRIM) ophthalmic solution Place 1 drop into the right eye every 4 (four) hours. 07/27/22   Particia Nearing, PA-C      Allergies    Shrimp [shellfish allergy] and Sulfa antibiotics    Review of Systems   Review of Systems  Constitutional:  Positive for fever. Negative for chills.  HENT:  Positive for dental problem. Negative for facial swelling, sore throat, trouble swallowing and voice change.     Physical Exam Updated Vital Signs BP 126/76 (BP Location: Right Arm)   Pulse 81   Temp 99.3 F (37.4 C) (Oral)   Resp 16   Ht 5\' 9"  (1.753 m)   Wt 61.2 kg   SpO2  97%   BMI 19.94 kg/m  Physical Exam Vitals and nursing note reviewed.  Constitutional:      General: He is not in acute distress.    Appearance: Normal appearance. He is not ill-appearing or diaphoretic.  HENT:     Mouth/Throat:     Mouth: Mucous membranes are moist.     Dentition: Dental tenderness, gingival swelling and dental abscesses present. No dental caries.     Pharynx: Oropharynx is clear. Uvula midline. No pharyngeal swelling, oropharyngeal exudate or posterior oropharyngeal erythema.     Tonsils: No tonsillar exudate or tonsillar abscesses.     Comments: Gingival swelling and mild ulceration around tooth #17.   No obvious dental caries or broken dentition. Cardiovascular:     Rate and Rhythm: Normal rate and regular rhythm.     Pulses: Normal pulses.  Pulmonary:     Effort: Pulmonary effort is normal.  Musculoskeletal:     Cervical back: Normal range of motion and neck supple. No edema.  Lymphadenopathy:     Head:     Right side of head: No submental, submandibular, preauricular or posterior auricular adenopathy.     Left side of head: No submental, submandibular, preauricular or posterior auricular adenopathy.     Cervical: No cervical adenopathy.  Skin:    General: Skin is warm and dry.  Neurological:     Mental Status: He is alert. Mental status is at baseline.  Psychiatric:        Mood and Affect: Mood normal.        Behavior: Behavior normal.     ED Results / Procedures / Treatments   Labs (all labs ordered are listed, but only abnormal results are displayed) Labs Reviewed - No data to display  EKG None  Radiology No results found.  Procedures Procedures    Medications Ordered in ED Medications  oxyCODONE-acetaminophen (PERCOCET/ROXICET) 5-325 MG per tablet 1 tablet (has no administration in time range)    ED Course/ Medical Decision Making/ A&P                           Medical Decision Making Risk Prescription drug management.   This patient presents to the ED with chief complaint(s) of dental pain and bleeding with pertinent past medical history of HIV .The complaint involves an extensive differential diagnosis and also carries with it a high risk of complications and morbidity.    The differential diagnosis includes periapical abscess, periodontal abscess, pulpitis, peritonsillar abscess, Ludwig's angina   Additional history obtained: None  Initial Assessment:   Exam significant for gingival swelling and ulceration around tooth #17.  Patient has tenderness to palpation of left TMJ, no obvious facial swelling or malocclusion.  Dentition appears overall in  good condition without broken teeth, significant or obvious dental caries, or widespread gingival swelling and bleeding.  No active gingival bleeding on exam.  Tenderness to palpation of tooth #17.  No lymphadenopathy or swelling to the neck to suggest Ludwig's angina.  No posterior oropharyngeal edema or tonsillar edema to suggest peritonsillar or retropharyngeal abscess.  Patient is able to swallow without difficulty and does not have a muffled voice.   Independent ECG/labs interpretation:  The following labs were independently interpreted:  Not indicated  Independent visualization and interpretation of imaging: I independently visualized the following imaging with scope of interpretation limited to determining acute life threatening conditions related to emergency care: Not indicated  Treatment and Reassessment: Will provide analgesia  while in ED. discussed with patient the use of acetaminophen and ibuprofen for swelling and pain at home.  Disposition:   The patient has been appropriately medically screened and/or stabilized in the ED. I have low suspicion for any other emergent medical condition which would require further screening, evaluation or treatment in the ED or require inpatient management. At time of discharge the patient is hemodynamically stable and in no acute distress. I have discussed work-up results and diagnosis with patient and answered all questions. Patient is agreeable with discharge plan. We discussed strict return precautions for returning to the emergency department and they verbalized understanding.  Discussed supportive care measures for symptoms at home.  Provided patient with a list of dental resources in the area.  Prescription for Augmentin was sent to patient's pharmacy for him to take over the next 10 days.  Liquid medication was provided given that patient states he is unable to swallow large pills.            Final Clinical Impression(s) / ED  Diagnoses Final diagnoses:  Dental abscess    Rx / DC Orders ED Discharge Orders          Ordered    amoxicillin-clavulanate (AUGMENTIN) 400-57 MG/5ML suspension  2 times daily        10/14/22 0930              Pat Kocher, PA 10/14/22 0933    Theressa Stamps R, PA 10/14/22 [id number]    Ezequiel Essex, MD 10/14/22 1700

## 2022-10-14 NOTE — ED Notes (Signed)
Pt given discharge instructions and reviewed prescriptions. Opportunities given for questions. Pt verbalizes understanding. Noelia Lenart R, RN 

## 2022-10-14 NOTE — Discharge Instructions (Addendum)
Thank you for allowing to be part of your care today.  You were evaluated in the ED for a possible dental infection.  I have sent an antibiotic to your pharmacy for you to take over the next 10 days.  Please take to completion.  I have sent over a liquid given that the pills are generally large in size.  You may alternate ibuprofen and acetaminophen every 4 hours as needed for pain and swelling.  You may take 1000 mg of acetaminophen at a time, do not exceed 4000 mg in a 24-hour period.  You may take 800 mg ibuprofen at a time, do not exceed 3200 mg in a 24-hour period.  I have attached a resource guide for dental services in the surrounding area.  Please do your best to try and schedule an appointment as soon as possible.  Return to the ER if you experience any worsening of your symptoms or have any new concerns.

## 2022-10-14 NOTE — ED Triage Notes (Signed)
Pt arrives to ED with c/o x3 days of dental pain.

## 2022-10-27 ENCOUNTER — Ambulatory Visit (INDEPENDENT_AMBULATORY_CARE_PROVIDER_SITE_OTHER): Payer: 59 | Admitting: Infectious Diseases

## 2022-10-27 ENCOUNTER — Other Ambulatory Visit (HOSPITAL_COMMUNITY): Payer: Self-pay

## 2022-10-27 ENCOUNTER — Other Ambulatory Visit: Payer: Self-pay

## 2022-10-27 ENCOUNTER — Encounter: Payer: Self-pay | Admitting: Infectious Diseases

## 2022-10-27 VITALS — BP 102/69 | HR 53 | Temp 98.1°F | Resp 16 | Ht 70.0 in | Wt 139.2 lb

## 2022-10-27 DIAGNOSIS — K2100 Gastro-esophageal reflux disease with esophagitis, without bleeding: Secondary | ICD-10-CM | POA: Diagnosis not present

## 2022-10-27 DIAGNOSIS — B20 Human immunodeficiency virus [HIV] disease: Secondary | ICD-10-CM | POA: Diagnosis not present

## 2022-10-27 DIAGNOSIS — F32A Depression, unspecified: Secondary | ICD-10-CM | POA: Diagnosis not present

## 2022-10-27 DIAGNOSIS — Z21 Asymptomatic human immunodeficiency virus [HIV] infection status: Secondary | ICD-10-CM | POA: Diagnosis not present

## 2022-10-27 DIAGNOSIS — F419 Anxiety disorder, unspecified: Secondary | ICD-10-CM

## 2022-10-27 MED ORDER — OMEPRAZOLE 40 MG PO CPDR
40.0000 mg | DELAYED_RELEASE_CAPSULE | Freq: Two times a day (BID) | ORAL | 1 refills | Status: DC
  Filled 2022-10-27: qty 60, 30d supply, fill #0

## 2022-10-27 MED ORDER — TIVICAY 50 MG PO TABS
50.0000 mg | ORAL_TABLET | Freq: Every day | ORAL | 11 refills | Status: AC
  Filled 2022-10-27: qty 30, 30d supply, fill #0

## 2022-10-27 MED ORDER — DESCOVY 200-25 MG PO TABS
1.0000 | ORAL_TABLET | Freq: Every day | ORAL | 11 refills | Status: DC
  Filled 2022-10-27: qty 30, 30d supply, fill #0

## 2022-10-27 NOTE — Progress Notes (Signed)
Name: Kelly Knight  DOB: January 12, 1996 MRN: [id number] PCP: Patient, No Pcp Per     Brief Narrative:  Kelly Knight is a 27 y.o. male with HIV disease, Dx 10/2018. CD4 nadir unknown.  HIV Risk: MSM History of OIs: none Intake Labs 01/2019: Hep B sAg (-) sAb (-)  cAb (-); hep A (+); hep c Ab (-) VL 223,000 CD4  RPR 1:2056 --> 1:128 --> 1:64  Quantiferon (-) HLA*B 5701 (-)   Previous Regimens: Tivicay + Descovy (needs crushable regimen)   Genotypes: 01/2019: [id number]   Subjective:   Chief Complaint  Patient presents with   Follow-up    B20 - patient reports acid reflux has been worse - omeprazole isn't as effective.      HPI: Kelly Knight is here for routine follow up.  GERD/acid reflux has been very bad lately - right after Thanksgiving he has had only a few days of the week where he can nausea/vomiting free. He has resumed OTC prilosec in the mornings but at night he has a hard time with uncontrolled reflux, belching, upper abdominal pain and bloating. He used to smoke marijuana and stopped with consideration of hyperemesis syndrome but this did nothing to resolve his symptoms. He has not been able to get his tivicay and descovy. He would really like to consider long acting injectable option for treatment to ensure he can stay on track despite GI issues. He is staying locally and has his own apartment now. Parents are close. No sexual partners since our lov together and no sexual health concerns today.    Wt Readings from Last 3 Encounters:  10/27/22 139 lb 3.2 oz (63.1 kg)  10/14/22 135 lb (61.2 kg)  08/26/22 135 lb (61.2 kg)     Review of Systems  Constitutional:  Negative for appetite change, chills, fatigue, fever and unexpected weight change.  Eyes:  Negative for visual disturbance.  Respiratory:  Negative for cough and shortness of breath.   Cardiovascular:  Negative for chest pain and leg swelling.  Gastrointestinal:  Negative for abdominal pain, diarrhea and  nausea.  Genitourinary:  Negative for dysuria, genital sores and penile discharge.  Musculoskeletal:  Negative for joint swelling.  Skin:  Negative for color change and rash.  Neurological:  Negative for dizziness and headaches.  Hematological:  Negative for adenopathy.  Psychiatric/Behavioral:  Positive for sleep disturbance. The patient is nervous/anxious.      Past Medical History:  Diagnosis Date   Asthma    Heart murmur    HIV (human immunodeficiency virus infection) ([place]) 01/23/2019   Situs inversus    Syphilis 01/23/2019    Outpatient Medications Prior to Visit  Medication Sig Dispense Refill   acetaminophen (TYLENOL) 500 MG tablet Take 500 mg by mouth every 6 (six) hours as needed (severe migraine).     albuterol (PROVENTIL) (2.5 MG/3ML) 0.083% nebulizer solution Take 2.5 mg by nebulization every 6 (six) hours as needed for wheezing.     albuterol (VENTOLIN HFA) 108 (90 Base) MCG/ACT inhaler Inhale 2 puffs into the lungs every 6 (six) hours as needed for wheezing or shortness of breath. 18 g 6   aspirin-acetaminophen-caffeine (EXCEDRIN MIGRAINE) 250-250-65 MG tablet Take 2 tablets by mouth 3 (three) times daily as needed for headache or migraine.     hydrOXYzine (ATARAX) 25 MG tablet Take 1 tablet (25 mg total) by mouth 3 (three) times daily as needed. 60 tablet 5   mirtazapine (REMERON) 15 MG tablet Take 1 tablet (15  mg total) by mouth at bedtime. 30 tablet 5   ondansetron (ZOFRAN ODT) 4 MG disintegrating tablet Take 1 tablet (4 mg total) by mouth every 8 (eight) hours as needed for nausea or vomiting. 20 tablet 0   dolutegravir (TIVICAY) 50 MG tablet Take 1 tablet (50 mg total) by mouth daily after supper. 30 tablet 11   emtricitabine-tenofovir AF (DESCOVY) 200-25 MG tablet Take 1 tablet by mouth daily after supper. 30 tablet 11   omeprazole (PRILOSEC) 20 MG capsule Take 1 capsule (20 mg total) by mouth daily. 30 capsule 2   trimethoprim-polymyxin b (POLYTRIM) ophthalmic  solution Place 1 drop into the right eye every 4 (four) hours. (Patient not taking: Reported on 10/27/2022) 10 mL 0   No facility-administered medications prior to visit.     Allergies  Allergen Reactions   Shrimp [Shellfish Allergy] Itching and Swelling    Throat swelling   Sulfa Antibiotics Rash    Social History   Tobacco Use   Smoking status: Former    Types: Cigarettes   Smokeless tobacco: Never   Tobacco comments:    Stopped Geophysical data processor Use: Former   Substances: THC  Substance Use Topics   Alcohol use: Not Currently    Comment: socially    Drug use: Not Currently    Frequency: 7.0 times per week    Types: Marijuana    Social History   Substance and Sexual Activity  Sexual Activity Not Currently   Partners: Male   Comment: declined condoms     Objective:   Vitals:   10/27/22 1524  BP: 102/69  Pulse: (!) 53  Resp: 16  Temp: 98.1 F (36.7 C)  TempSrc: Oral  SpO2: 99%  Weight: 139 lb 3.2 oz (63.1 kg)  Height: 5\' 10"  (1.778 m)   Body mass index is 19.97 kg/m.  Physical Exam Vitals reviewed.  Constitutional:      Appearance: He is well-developed.     Comments: Seated comfortably in chair during visit.   HENT:     Mouth/Throat:     Dentition: Normal dentition. No dental abscesses.  Cardiovascular:     Rate and Rhythm: Normal rate and regular rhythm.     Heart sounds: Normal heart sounds.  Pulmonary:     Effort: Pulmonary effort is normal.     Breath sounds: Normal breath sounds.  Abdominal:     General: There is no distension.     Palpations: Abdomen is soft.     Tenderness: There is no abdominal tenderness.  Lymphadenopathy:     Cervical: No cervical adenopathy.  Skin:    General: Skin is warm and dry.     Findings: No rash.  Neurological:     Mental Status: He is alert and oriented to person, place, and time.  Psychiatric:        Judgment: Judgment normal.     Lab Results Lab Results  Component Value Date   WBC  3.8 05/05/2022   HGB 14.0 05/05/2022   HCT 42.1 05/05/2022   MCV 84.4 05/05/2022   PLT 190 05/05/2022    Lab Results  Component Value Date   CREATININE 0.92 05/05/2022   BUN 13 05/05/2022   NA 136 05/05/2022   K 3.9 05/05/2022   CL 105 05/05/2022   CO2 26 05/05/2022    Lab Results  Component Value Date   ALT 8 (L) 05/05/2022   AST 15 05/05/2022   ALKPHOS 65 05/19/2021  BILITOT 0.4 05/05/2022    Lab Results  Component Value Date   CHOL 131 01/17/2019   HDL 31 (L) 01/17/2019   LDLCALC 84 01/17/2019   TRIG 69 01/17/2019   CHOLHDL 4.2 01/17/2019   HIV 1 RNA Quant (Copies/mL)  Date Value  05/05/2022 90,400 (H)  10/29/2021 24 (H)  06/03/2021 104,000 (H)   CD4 T Cell Abs (/uL)  Date Value  10/27/2022 267 (L)  05/05/2022 228 (L)  06/03/2021 228 (L)     Assessment & Plan:   Problem List Items Addressed This Visit       High   HIV (human immunodeficiency virus infection) ([place]) (Chronic)    Refill Tivicay + Descovy. Continue to crush and take together daily for now. Check genotype for consideration of cabenuva now that he is local. Discussed he will need to reduce VL to < 200 copies before we can consider switching and barring his genotype is acceptable. He understands this is 2 shots given IM q16m for maintenance. Loading process discussed as well.   Return in about 2 months (around 12/26/2022).       Relevant Medications   dolutegravir (TIVICAY) 50 MG tablet   emtricitabine-tenofovir AF (DESCOVY) 200-25 MG tablet   Other Relevant Orders   HIV RNA, RTPCR W/R GT (RTI, PI,INT)   T-helper cells (CD4) count (Completed)     Unprioritized   Anxiety and depression    Seem to be under control at this time. Having his dog with him in the apartment has been very helpful for his anxiety management.       GERD with esophagitis - Primary    Symptoms concerning for possible h pylori infection. Will get him set up for stool test - he will bring back sample next week.  Rx  strength omeprazole with BID use for now. Marijuana cessation did not seem to alter his symptoms. He would like referral to GI for evaluation and consideration of endoscopy now that he has Medicaid.       Relevant Orders   Ambulatory referral to Gastroenterology   Helicobacter Pylori Special Antigen, Stool    Janene Madeira, MSN, NP-C [email or web address] [email or web address] Pager: [phone] Office: [phone] [place]: [phone]   10/28/22  2:47 PM

## 2022-10-27 NOTE — Patient Instructions (Addendum)
Nice to see you   Please call [facility] @ [phone] to schedule a dental appointment   For the reflux - will try the omeprazole twice a day 30 min before breakfast and 30 min before dinner   For Medicaid -  [place] Medicaid [place] Phone: [phone] Monday-Friday 8 a.m. to 5 p.m.  Go to [facility] today to get your Tivicay and Descovy -it's ready to pick up for you. For future fills we can do the [employer] to coordinate them mailing it to you.   Will do some testing to see if cabenuva is an option for you.   Please come back in 2 months to check in - try to bring me a stool sample in the mean time.

## 2022-10-28 ENCOUNTER — Other Ambulatory Visit (HOSPITAL_COMMUNITY): Payer: Self-pay

## 2022-10-28 LAB — T-HELPER CELLS (CD4) COUNT (NOT AT ARMC)
CD4 % Helper T Cell: 14 % — ABNORMAL LOW (ref 33–65)
CD4 T Cell Abs: 267 /uL — ABNORMAL LOW (ref 400–1790)

## 2022-10-28 NOTE — Assessment & Plan Note (Signed)
Symptoms concerning for possible h pylori infection. Will get him set up for stool test - he will bring back sample next week.  Rx strength omeprazole with BID use for now. Marijuana cessation did not seem to alter his symptoms. He would like referral to GI for evaluation and consideration of endoscopy now that he has Medicaid.

## 2022-10-28 NOTE — Assessment & Plan Note (Signed)
Refill Tivicay + Descovy. Continue to crush and take together daily for now. Check genotype for consideration of cabenuva now that he is local. Discussed he will need to reduce VL to < 200 copies before we can consider switching and barring his genotype is acceptable. He understands this is 2 shots given IM q78m for maintenance. Loading process discussed as well.   Return in about 2 months (around 12/26/2022).

## 2022-10-28 NOTE — Assessment & Plan Note (Signed)
Seem to be under control at this time. Having his dog with him in the apartment has been very helpful for his anxiety management.

## 2022-11-01 ENCOUNTER — Telehealth (HOSPITAL_COMMUNITY): Payer: Self-pay | Admitting: Professional

## 2022-11-04 LAB — HIV RNA, RTPCR W/R GT (RTI, PI,INT)
HIV 1 RNA Quant: 98000 copies/mL — ABNORMAL HIGH
HIV-1 RNA Quant, Log: 4.99 Log copies/mL — ABNORMAL HIGH

## 2022-11-04 LAB — HIV-1 INTEGRASE GENOTYPE

## 2022-11-04 LAB — HIV-1 GENOTYPE: HIV-1 Genotype: DETECTED — AB

## 2022-11-10 ENCOUNTER — Other Ambulatory Visit (HOSPITAL_COMMUNITY): Payer: Self-pay

## 2022-11-14 ENCOUNTER — Telehealth (HOSPITAL_COMMUNITY): Payer: Self-pay | Admitting: Professional

## 2022-11-14 ENCOUNTER — Other Ambulatory Visit (HOSPITAL_COMMUNITY): Payer: Self-pay

## 2022-11-25 ENCOUNTER — Ambulatory Visit: Payer: Medicaid Other | Admitting: Family Medicine

## 2022-11-25 ENCOUNTER — Ambulatory Visit: Payer: 59 | Admitting: Family Medicine

## 2022-12-14 ENCOUNTER — Telehealth: Payer: Self-pay

## 2022-12-14 ENCOUNTER — Other Ambulatory Visit (HOSPITAL_COMMUNITY): Payer: Self-pay

## 2022-12-14 NOTE — Telephone Encounter (Signed)
Left a voicemail for the patient asking that he call back so we can discuss a potential alteration in his medication regimen due to insurance preference.

## 2022-12-16 ENCOUNTER — Telehealth: Payer: 59

## 2022-12-16 ENCOUNTER — Encounter: Payer: Self-pay | Admitting: Nurse Practitioner

## 2022-12-19 ENCOUNTER — Ambulatory Visit: Payer: Medicaid Other | Admitting: Infectious Diseases

## 2022-12-20 ENCOUNTER — Other Ambulatory Visit: Payer: Self-pay

## 2022-12-20 ENCOUNTER — Ambulatory Visit (INDEPENDENT_AMBULATORY_CARE_PROVIDER_SITE_OTHER): Payer: 59 | Admitting: Infectious Diseases

## 2022-12-20 ENCOUNTER — Encounter: Payer: Self-pay | Admitting: Infectious Diseases

## 2022-12-20 VITALS — BP 119/80 | HR 80 | Resp 16 | Ht 70.0 in | Wt 138.0 lb

## 2022-12-20 DIAGNOSIS — Z8619 Personal history of other infectious and parasitic diseases: Secondary | ICD-10-CM | POA: Diagnosis present

## 2022-12-20 DIAGNOSIS — F419 Anxiety disorder, unspecified: Secondary | ICD-10-CM | POA: Diagnosis not present

## 2022-12-20 DIAGNOSIS — K2100 Gastro-esophageal reflux disease with esophagitis, without bleeding: Secondary | ICD-10-CM | POA: Diagnosis not present

## 2022-12-20 DIAGNOSIS — B20 Human immunodeficiency virus [HIV] disease: Secondary | ICD-10-CM

## 2022-12-20 DIAGNOSIS — F32A Depression, unspecified: Secondary | ICD-10-CM | POA: Diagnosis not present

## 2022-12-20 DIAGNOSIS — R110 Nausea: Secondary | ICD-10-CM

## 2022-12-20 MED ORDER — ONDANSETRON 4 MG PO TBDP: 4.0000 mg | ORAL_TABLET | Freq: Three times a day (TID) | ORAL | 5 refills | Status: DC | PRN

## 2022-12-20 MED ORDER — HYDROXYZINE HCL 25 MG PO TABS: 25.0000 mg | ORAL_TABLET | Freq: Three times a day (TID) | ORAL | 5 refills | Status: DC | PRN

## 2022-12-20 NOTE — Progress Notes (Signed)
Name: Kelly Knight  DOB: 28-Feb-1996 MRN: [id number] PCP: Patient, No Pcp Per     Brief Narrative:  Kelly Knight is a 27 y.o. male with HIV disease, Dx 10/2018. CD4 nadir unknown.  HIV Risk: MSM History of OIs: none Intake Labs 01/2019: Hep B sAg (-) sAb (-)  cAb (-); hep A (+); hep c Ab (-) VL 223,000 CD4  RPR 1:2056 --> 1:128 --> 1:64  Quantiferon (-) HLA*B 5701 (-)   Previous Regimens: Tivicay + Descovy (needs crushable regimen)   Genotypes: 01/2019: [id number]   Subjective:   Chief Complaint  Patient presents with   Follow-up     HPI: Kelly Knight is here for routine follow up and consideration for cabenuva injections.  Had some trouble with affording his medications this has since been resolved   Weight is up a bit - working with [facility] and [facility] to help with rent. Has a roommate now that is not contributing as much and feels like they need to.   Has not taken remeron in a while - doing OK without from what it seems.  His weight is stable and even up a few pounds.  Current weight 138 with a normal BMI of 19.8.  He would like a prescription refill for hydroxyzine to help with acute stress management as well as Zofran.  He is taking over-the-counter omeprazole with good effect.   Wt Readings from Last 3 Encounters:  12/20/22 138 lb (62.6 kg)  10/27/22 139 lb 3.2 oz (63.1 kg)  10/14/22 135 lb (61.2 kg)    Review of Systems  Constitutional:  Negative for appetite change, chills, fatigue, fever and unexpected weight change.  Eyes:  Negative for visual disturbance.  Respiratory:  Negative for cough and shortness of breath.   Cardiovascular:  Negative for chest pain and leg swelling.  Gastrointestinal:  Negative for abdominal pain, diarrhea and nausea.  Genitourinary:  Negative for dysuria, genital sores and penile discharge.  Musculoskeletal:  Negative for joint swelling.  Skin:  Negative for color change and rash.  Neurological:  Negative for dizziness and  headaches.  Hematological:  Negative for adenopathy.  Psychiatric/Behavioral:  Positive for sleep disturbance. The patient is nervous/anxious.     Past Medical History:  Diagnosis Date   Asthma    Heart murmur    HIV (human immunodeficiency virus infection) ([place]) 01/23/2019   Situs inversus    Syphilis 01/23/2019    Outpatient Medications Prior to Visit  Medication Sig Dispense Refill   acetaminophen (TYLENOL) 500 MG tablet Take 500 mg by mouth every 6 (six) hours as needed (severe migraine).     albuterol (PROVENTIL) (2.5 MG/3ML) 0.083% nebulizer solution Take 2.5 mg by nebulization every 6 (six) hours as needed for wheezing.     albuterol (VENTOLIN HFA) 108 (90 Base) MCG/ACT inhaler Inhale 2 puffs into the lungs every 6 (six) hours as needed for wheezing or shortness of breath. 18 g 6   aspirin-acetaminophen-caffeine (EXCEDRIN MIGRAINE) 250-250-65 MG tablet Take 2 tablets by mouth 3 (three) times daily as needed for headache or migraine.     dolutegravir (TIVICAY) 50 MG tablet Take 1 tablet (50 mg total) by mouth daily after supper. 30 tablet 11   emtricitabine-tenofovir AF (DESCOVY) 200-25 MG tablet Take 1 tablet by mouth daily after supper. 30 tablet 11   mirtazapine (REMERON) 15 MG tablet Take 1 tablet (15 mg total) by mouth at bedtime. 30 tablet 5   omeprazole (PRILOSEC) 40 MG capsule Take 1 capsule (  40 mg total) by mouth 2 (two) times daily. 30 min before breakfast and dinner 60 capsule 1   hydrOXYzine (ATARAX) 25 MG tablet Take 1 tablet (25 mg total) by mouth 3 (three) times daily as needed. 60 tablet 5   ondansetron (ZOFRAN ODT) 4 MG disintegrating tablet Take 1 tablet (4 mg total) by mouth every 8 (eight) hours as needed for nausea or vomiting. 20 tablet 0   No facility-administered medications prior to visit.     Allergies  Allergen Reactions   Shrimp [Shellfish Allergy] Itching and Swelling    Throat swelling   Sulfa Antibiotics Rash    Social History   Tobacco Use    Smoking status: Former    Types: Cigarettes    Passive exposure: Never   Smokeless tobacco: Never   Tobacco comments:    Stopped vaping   Vaping Use   Vaping Use: Former   Substances: THC  Substance Use Topics   Alcohol use: Not Currently    Comment: socially    Drug use: Not Currently    Frequency: 7.0 times per week    Types: Marijuana    Social History   Substance and Sexual Activity  Sexual Activity Not Currently   Partners: Male   Comment: declined condoms     Objective:   Vitals:   12/20/22 1104  BP: 119/80  Pulse: 80  Resp: 16  Weight: 138 lb (62.6 kg)  Height: '5\' 10"'$  (1.778 m)   Body mass index is 19.8 kg/m.  Physical Exam Vitals reviewed.  Constitutional:      Appearance: He is well-developed.     Comments: Seated comfortably in chair during visit.   HENT:     Mouth/Throat:     Dentition: Normal dentition. No dental abscesses.  Cardiovascular:     Rate and Rhythm: Normal rate and regular rhythm.     Heart sounds: Normal heart sounds.  Pulmonary:     Effort: Pulmonary effort is normal.     Breath sounds: Normal breath sounds.  Abdominal:     General: There is no distension.     Palpations: Abdomen is soft.     Tenderness: There is no abdominal tenderness.  Lymphadenopathy:     Cervical: No cervical adenopathy.  Skin:    General: Skin is warm and dry.     Findings: No rash.  Neurological:     Mental Status: He is alert and oriented to person, place, and time.  Psychiatric:        Judgment: Judgment normal.     Lab Results Lab Results  Component Value Date   WBC 3.8 05/05/2022   HGB 14.0 05/05/2022   HCT 42.1 05/05/2022   MCV 84.4 05/05/2022   PLT 190 05/05/2022    Lab Results  Component Value Date   CREATININE 0.92 05/05/2022   BUN 13 05/05/2022   NA 136 05/05/2022   K 3.9 05/05/2022   CL 105 05/05/2022   CO2 26 05/05/2022    Lab Results  Component Value Date   ALT 8 (L) 05/05/2022   AST 15 05/05/2022   ALKPHOS 65  05/19/2021   BILITOT 0.4 05/05/2022    Lab Results  Component Value Date   CHOL 131 01/17/2019   HDL 31 (L) 01/17/2019   LDLCALC 84 01/17/2019   TRIG 69 01/17/2019   CHOLHDL 4.2 01/17/2019   HIV 1 RNA Quant  Date Value  10/27/2022 98,000 copies/mL (H)  05/05/2022 90,400 Copies/mL (H)  10/29/2021 24 Copies/mL (  H)   CD4 T Cell Abs (/uL)  Date Value  10/27/2022 267 (L)  05/05/2022 228 (L)  06/03/2021 228 (L)     Assessment & Plan:   Problem List Items Addressed This Visit       High   HIV (human immunodeficiency virus infection) ([place]) (Chronic)    Prescribed Tivicay and Descovy taken together once daily for HIV care management.  When he gets his medications inconsistently his viral load is undetectable.  Recently had trouble with affording medications but now all problems associated with insurance barrier has been resolved.  I do think he would be a good cavity of a candidate if he can get his viral load durably suppressed.  Viral load to be checked today (though he just picked up his prescriptions and started taking daily since 3/05) and again in 2 months.  His medication will be approved based off of his assistance program.  Further recommendations to follow. Declined any sexual health concerns or screenings today.  Declined any vaccinations.      Relevant Orders   HIV 1 RNA quant-no reflex-bld     Unprioritized   History of syphilis - Primary    Repeat RPR today for monitoring.  Has been serofast at 1:64 even despite neurosyphilis treatment.      Relevant Orders   RPR   Anxiety and depression    Refilled hydroxyzine today.  He seems to be doing well off of the Remeron.  Discussed ongoing monitoring of his mental health.  I support him resuming this with a low threshold      Relevant Medications   hydrOXYzine (ATARAX) 25 MG tablet   GERD with esophagitis    Continue over-the-counter omeprazole as needed.  As needed Zofran prescribed      Other Visit Diagnoses      Nausea       Relevant Medications   ondansetron (ZOFRAN ODT) 4 MG disintegrating tablet       Janene Madeira, MSN, NP-C [place] [facility] [email or web address] Pager: [phone] Office: [phone] [place]: [phone]   12/20/22  12:06 PM

## 2022-12-20 NOTE — Assessment & Plan Note (Signed)
Repeat RPR today for monitoring.  Has been serofast at 1:64 even despite neurosyphilis treatment.

## 2022-12-20 NOTE — Assessment & Plan Note (Addendum)
Refilled hydroxyzine today.  He seems to be doing well off of the Remeron.  Discussed ongoing monitoring of his mental health.  I support him resuming this with a low threshold

## 2022-12-20 NOTE — Assessment & Plan Note (Signed)
Prescribed Tivicay and Descovy taken together once daily for HIV care management.  When he gets his medications inconsistently his viral load is undetectable.  Recently had trouble with affording medications but now all problems associated with insurance barrier has been resolved.  I do think he would be a good cavity of a candidate if he can get his viral load durably suppressed.  Viral load to be checked today (though he just picked up his prescriptions and started taking daily since 3/05) and again in 2 months.  His medication will be approved based off of his assistance program.  Further recommendations to follow. Declined any sexual health concerns or screenings today.  Declined any vaccinations.

## 2022-12-20 NOTE — Patient Instructions (Addendum)
Nice to see you!   Continue your tivicay and descovy.

## 2022-12-20 NOTE — Assessment & Plan Note (Signed)
Continue over-the-counter omeprazole as needed.  As needed Zofran prescribed

## 2022-12-23 LAB — RPR TITER: RPR Titer: 1:32 {titer} — ABNORMAL HIGH

## 2022-12-23 LAB — T PALLIDUM AB: T Pallidum Abs: POSITIVE — AB

## 2022-12-23 LAB — HIV-1 RNA QUANT-NO REFLEX-BLD
HIV 1 RNA Quant: 84500 Copies/mL — ABNORMAL HIGH
HIV-1 RNA Quant, Log: 4.93 Log cps/mL — ABNORMAL HIGH

## 2022-12-23 LAB — RPR: RPR Ser Ql: REACTIVE — AB

## 2023-01-10 ENCOUNTER — Ambulatory Visit (HOSPITAL_COMMUNITY)
Admission: RE | Admit: 2023-01-10 | Discharge: 2023-01-10 | Disposition: A | Discharge status: Home / Self Care | Payer: Medicaid Other | Source: Ambulatory Visit | Attending: Emergency Medicine | Admitting: Emergency Medicine

## 2023-01-10 ENCOUNTER — Encounter (HOSPITAL_COMMUNITY): Payer: Self-pay

## 2023-01-10 VITALS — BP 97/60 | HR 60 | Temp 98.8°F | Resp 18

## 2023-01-10 DIAGNOSIS — K625 Hemorrhage of anus and rectum: Secondary | ICD-10-CM | POA: Diagnosis not present

## 2023-01-10 NOTE — ED Provider Notes (Signed)
[place]    CSN: [id number] Arrival date & time: 01/10/23  1508      History   Chief Complaint Chief Complaint  Patient presents with   Rectal Bleeding    HPI Kelly Knight is a 27 y.o. male.  Early this morning he had a small amount of bright red blood after wiping, after a bowel movement. It happened again a few hours later. No BM since then. He denies any pain or straining with BM but does usually run constipated.  No dark/tarry stool. Denies shortness of breath, dizziness, abdominal pain, vomiting/hematemesis. No fever Does report history of this and saw GI, may have had hemorrhoid  Hx HIV and syphilis   Past Medical History:  Diagnosis Date   Asthma    Heart murmur    HIV (human immunodeficiency virus infection) 01/23/2019   Situs inversus    Syphilis 01/23/2019    Patient Active Problem List   Diagnosis Date Noted   Adjustment disorder with mixed anxiety and depressed mood [id number]   Uncomplicated asthma [id number]   Situs inversus abdominalis 05/17/2021   GERD with esophagitis 01/21/2021   Environmental allergies 07/05/2020   Anxiety and depression 05/10/2019   HIV (human immunodeficiency virus infection) 01/23/2019   History of syphilis 01/23/2019    History reviewed. No pertinent surgical history.     Home Medications    Prior to Admission medications   Medication Sig Start Date End Date Taking? Authorizing Provider  dolutegravir (TIVICAY) 50 MG tablet Take 1 tablet (50 mg total) by mouth daily after supper. 10/27/22  Yes [place] Callas, NP  emtricitabine-tenofovir AF (DESCOVY) 200-25 MG tablet Take 1 tablet by mouth daily after supper. 10/27/22  Yes [place] Callas, NP  mirtazapine (REMERON) 15 MG tablet Take 1 tablet (15 mg total) by mouth at bedtime. 05/05/22  Yes [place] Callas, NP  omeprazole (PRILOSEC) 40 MG capsule Take 1 capsule (40 mg total) by mouth 2 (two) times daily. 30 min before breakfast and dinner 10/27/22   Yes [place] Callas, NP  albuterol (PROVENTIL) (2.5 MG/3ML) 0.083% nebulizer solution Take 2.5 mg by nebulization every 6 (six) hours as needed for wheezing.    [provider]  albuterol (VENTOLIN HFA) 108 (90 Base) MCG/ACT inhaler Inhale 2 puffs into the lungs every 6 (six) hours as needed for wheezing or shortness of breath. 10/29/21   [place] Callas, NP  aspirin-acetaminophen-caffeine (EXCEDRIN MIGRAINE) [phone] MG tablet Take 2 tablets by mouth 3 (three) times daily as needed for headache or migraine.    [provider]    Family History Family History  Problem Relation Age of Onset   Diabetes Mother    Hypertension Mother    Hypertension Father     Social History Social History   Tobacco Use   Smoking status: Former    Types: Cigarettes    Passive exposure: Never   Smokeless tobacco: Never   Tobacco comments:    Stopped Air traffic controller Use: Former   Substances: THC  Substance Use Topics   Alcohol use: Not Currently    Comment: socially    Drug use: Not Currently    Frequency: 7.0 times per week    Types: Marijuana     Allergies   Shrimp [shellfish allergy] and Sulfa antibiotics   Review of Systems Review of Systems As per HPI  Physical Exam Triage Vital Signs ED Triage Vitals  Enc Vitals Group  BP 01/10/23 1541 (!) 96/58     Pulse Rate 01/10/23 1541 (!) 55     Resp 01/10/23 1541 18     Temp 01/10/23 1541 98.8 F (37.1 C)     Temp Source 01/10/23 1541 Oral     SpO2 01/10/23 1541 93 %     Weight --      Height --      Head Circumference --      Peak Flow --      Pain Score 01/10/23 1539 0     Pain Loc --      Pain Edu? --      Excl. in [place]? --    No data found.  Updated Vital Signs BP 97/60 (BP Location: Right Arm)   Pulse 60   Temp 98.8 F (37.1 C) (Oral)   Resp 18   SpO2 93%    Physical Exam Vitals and nursing note reviewed. Exam conducted with a chaperone present Mearl Latin CMA).   Constitutional:      General: He is not in acute distress.    Appearance: He is not ill-appearing.  HENT:     Mouth/Throat:     Pharynx: Oropharynx is clear.  Eyes:     Conjunctiva/sclera: Conjunctivae normal.  Cardiovascular:     Rate and Rhythm: Normal rate and regular rhythm.     Heart sounds: Normal heart sounds.  Pulmonary:     Effort: Pulmonary effort is normal.     Breath sounds: Normal breath sounds.  Abdominal:     Tenderness: There is no abdominal tenderness. There is no guarding.  Genitourinary:    Rectum: Normal. No tenderness, anal fissure or external hemorrhoid. Normal anal tone.     Comments: Unremarkable  Musculoskeletal:        General: Normal range of motion.  Skin:    General: Skin is warm and dry.     Coloration: Skin is not pale.     Findings: No rash.  Neurological:     Mental Status: He is alert and oriented to person, place, and time.     UC Treatments / Results  Labs (all labs ordered are listed, but only abnormal results are displayed) Labs Reviewed - No data to display  EKG  Radiology No results found.  Procedures Procedures (including critical care time)  Medications Ordered in UC Medications - No data to display  Initial Impression / Assessment and Plan / UC Course  I have reviewed the triage vital signs and the nursing notes.  Pertinent labs & imaging results that were available during my care of the patient were reviewed by me and considered in my medical decision making (see chart for details).  Mildly bradycardic, reports he usually has a low resting HR. Asymptomatic.   Unremarkable rectal exam, no external hemorrhoid or fissure to explain BRB. However reassuring it was only a small amount, and no pain. Consider internal hemorrhoid. Low concern for GI bleed or acute blood loss. Will try stool softener to avoid constipation/straining, increase fiber and fluids. Strict return and ED precautions discussed. He will also follow with  GI if needed.  Final Clinical Impressions(s) / UC Diagnoses   Final diagnoses:  BRBPR (bright red blood per rectum)     Discharge Instructions      I do recommend to try a stool softener such as MiraLAX.  Make sure you are drinking lots of fluids and eating enough fiber.  As best you can try to avoid straining with stools  and sitting for long periods of time  Please call the GI specialist and follow-up with them regarding your symptoms.  If anything worsens or you develop severe pain, dark black stools, shortness of breath or dizziness, please go to the emergency department    ED Prescriptions   None    PDMP not reviewed this encounter.   [place], [place] 01/10/23 1631

## 2023-01-10 NOTE — Discharge Instructions (Addendum)
I do recommend to try a stool softener such as MiraLAX.  Make sure you are drinking lots of fluids and eating enough fiber.  As best you can try to avoid straining with stools and sitting for long periods of time  Please call the GI specialist and follow-up with them regarding your symptoms.  If anything worsens or you develop severe pain, dark black stools, shortness of breath or dizziness, please go to the emergency department

## 2023-01-10 NOTE — ED Triage Notes (Signed)
Pt states he had two BM this morning and both times he had BRB in his stool. He states this happened about a year ago too. He states he gets a sharpe pain on his left side when he has to have a BM

## 2023-01-16 ENCOUNTER — Ambulatory Visit (HOSPITAL_COMMUNITY)
Admission: EM | Admit: 2023-01-16 | Discharge: 2023-01-16 | Disposition: A | Discharge status: Home / Self Care | Payer: Medicaid Other | Attending: Behavioral Health | Admitting: Behavioral Health

## 2023-01-16 ENCOUNTER — Encounter (HOSPITAL_COMMUNITY): Payer: Self-pay | Admitting: Behavioral Health

## 2023-01-16 DIAGNOSIS — F4323 Adjustment disorder with mixed anxiety and depressed mood: Secondary | ICD-10-CM | POA: Insufficient documentation

## 2023-01-16 NOTE — Discharge Instructions (Addendum)
Discharge recommendations:   Outpatient Follow up: Please review list of outpatient resources for psychiatry and counseling. Please follow up with your primary care provider for all medical related needs.   You are encouraged to follow up with [facility] for outpatient treatment as early as tomorrow for Walk in/ Open Access Hours: Monday - Friday 8AM - 11AM (To see provider and therapist) Friday - 1PM - 4PM (To see therapist only)  [facility] [address] [place], [place] [phone]  Therapy: We recommend that patient participate in individual therapy to address mental health concerns.  The patient should abstain from use of illicit substances/drugs and abuse of any medications.  If symptoms worsen or do not continue to improve or if the patient becomes actively suicidal or homicidal then it is recommended that the patient return to the closest hospital emergency department, the [facility], or call 911 for further evaluation and treatment. National Suicide Prevention Lifeline 1-800-SUICIDE or 5200414631.  About 988 988 offers 24/7 access to trained crisis counselors who can help people experiencing mental health-related distress. People can call or text 988 or chat 988lifeline.org for themselves or if they are worried about a loved one who may need crisis support.    Base on the information you have provided and the presenting issue, outpatient services and resources for have been recommended.  It is imperative that you follow through with treatment recommendations within 5-7 days from the of discharge to mitigate further risk to your safety and mental well-being. A list of referrals has been provided below to get you started.  You are not limited to the list provided.  In case of an urgent crisis, you may contact the Mobile Crisis Unit with [facility] at  1.210-494-7156.            "From small beginnings come great things"   [phone]  Mental Health Intensive Outpatient Therapy Program   The Intensive Outpatient Therapy Program (IOP) is short-term (ten day) group therapy program for adults (men and women) experiencing depression and/or anxiety.  Members of the program will learn that they are not alone as they receive support from others who are working on similar issues in an environment that promotes healing and growth.  Members will learn new ways to cope with stress, anxiety and depression through a combination of group therapy, mental health education and peer support. A variety of methods for mood stabilization will be used to help with the recovery and rebuilding process.  Reasons for participating in the program may include, but are not limited to:   Relationship and family problems   Depression after childbirth    Major life changes   Employment stress   Grief and Loss Issues   Group therapy sessions occur daily (Monday through Friday from 9:00 am to 12:00 pm) and are facilitated by a Licensed Therapist.  The groups are designed to be interactive and person centered to allow each member to be the center of their recovery process. Each member will personalize their own recovery experience by identifying and working towards their own personalized wellness goals.  In addition to group counseling, members will also receive a thorough medication evaluation and ongoing mediation monitoring by the program licensed Psychiatrist, as well as supportive services and referrals to community resources by the programs Masters Level Case Production designer, theatre/television/film.  As members approach the end of their stay in the program, they will actively participate in creating their own personalized  transition plan. Members will review their progress, discuss ways to maintain wellness and receive referrals to community resources that will assist with continued  recovery.  We are committed to providing exceptional mental health services and are proud to be accredited nationally by both the [employer], holding the coveted [employer] and by the [employer], holding the [employer] in quality care, Engineer, petroleum.    If you would like more information on The Intensive Outpatient Therapy Program (IOP), or would like to make a referral please contact us. We are here to help and look forward to speaking with you.  Sincerely,   Jeri Modena, M.Ed, CNA                [address]                                                          [place], [place] [address] T: [phone] F: [phone]   What to Expect            Nurturing Therapeutic Environment We provide a peaceful and healing environment where members feel a sense of overall safety and comfort.     Process Group Members participate in a one hour process group each day with a licensed clinical therapist.  The purpose of group therapy is to gain support and understanding from others experiencing similar situations while gaining understanding of how thoughts, feelings and behaviors affect overall wellness.    Education Groups Members participate in a one hour education group each day with a licensed clinical therapist. These groups are designed to help teach skills to manage mental health symptoms, increase self-awareness of destructive behaviors and increase self-esteem.  A variety of topics are covered including; boundaries, effective communication, self-care, Triggers, Sleep Hygiene, Relaxation, mental health education and healthy relationship skills.    Grief and Loss Group Members participate in a weekly, one hour grief and loss group with a trained grief and loss counselor. These groups are designed to help members identify and grieve various losses impacting overall wellness.   Medication Education Group Members  will participate in one medication education group with the programs licensed Pharmacist.  This group is tailored to the individualized questions and needs of each member.   DBT Skills and Mindfulness Members will be introduced to techniques that will help with emotion regulation, distress tolerance, and learning ways to be more "in the moment".   Stress Management Skills Members will be introduced to different relaxation techniques to help manage stress, encourage relaxation and increase emotional balance including; Progressive Muscle Relaxation (PMR), Visualization, Aromatherapy and Heartmath (an emotional regulation technique).   Journaling Journaling promotes private self-regulation and self-awareness and will be used as a tool to allow members to begin to identify, express and resolve feelings as well as gain a greater understanding of oneself and the progress made throughout their healing process.   Therapeutic Assignments Assignments are assigned regularly to work on outside of the group sessions to help reinforce skills learned, practice new behaviors and identify strategies for maintaining overall wellness.    Psychiatric Consultation Our program psychiatrist is highly trained and experienced in providing the highest quality of mental health mediation management. Members will receive an initial medication assessment and ongoing medication monitoring throughout their  time in the program. At the completion of the program, members will be referred to an external psychiatrist after for continued medication services.   Case Management Our program Case Manager is highly trained and experienced in providing supportive services and referrals to community resources.  Members will receive assistance with identifying and reducing barriers to maintaining wellness   ..OBS Care Management   Outpatient Services for Therapy and Medication Management for Medicaid  Based on what you have shared, a  list of resources for outpatient therapy and psychiatry is provided below to get you started back on treatment.  It is imperative that you follow through with treatment within 5-7 days from the day of discharge to prevent any further risk to your safety or mental well-being.  You are not limited to the list provided.  In case of an urgent crisis, you may contact the Mobile Crisis Unit with [facility] at [phone].         [facility] [address], [place], [address] [phone] phone  New Patient Assessment/Therapy Walk-ins Monday and Wednesday: 8am until slots are full. Every 1st and 2nd Friday: 1pm - 5pm  NO ASSESSMENT/THERAPY WALK-INS ON TUESDAYS OR THURSDAYS  New Patient Psychiatry/Medication Management Walk-ins Monday-Friday: 8am-11am  For all walk-ins, we ask that you arrive by 7:30am because patient will be seen in the order of arrival.  Availability is limited; therefore, you may not be seen on the same day that you walk-in.  Our goal is to serve and meet the needs of our community to the best of our ability.   [facility] [address] [place], [place], [address] [phone] phone  [facility] [address] [place], [place], [address] [phone] phone [phone] phone ([address], Anthem/Elevance, [address], [address], [address], [address], Healthy [place], [place], [place], [address], Conoco[employer]illips, [place], UHC, American Financial, [place], Out of Network)  [employer], [place] [address] [place], [place], [address] [phone] phone ([place], Anthem/Elevance, Sanmina-SCI Options/Carelon, BCBS, [address], [place], [place], [place], [place], [employer], [place], [place], [place], [facility])  [employer] [address] [place], [place], [address] [phone] phone (Medicaid, ask about other  insurance)  [facility] [address], [address] [place], [place], [address] [phone] phone [phone] fax ([address], [place] , [place], [place], [place] Health Choice, UHC, General Electric, Self-Pay)  [facility] [address] [place], [place], [address] [phone] phone ([address] Anthem/Elevance, [address], [address], [place], CSX Corporation, [place], [place], [place], Har[employer] Entertainment, [place], [place], [place], [facility])  [facility] - 6-8 MONTH WAIT FOR THERAPY; SOONER FOR MEDICATION MANAGEMENT [address], [address] [place], [place], [address] [phone] phone ([address], AmeriHealth [address] - [place], [address], [place], [place], Friday Health Plans, [address], BCBS Healthy [place], [place], [address], [place], [place], [place], [place], Tricare, UHC, Safeco Corporation, [place])  [facility] [address] [place], [place], [address] [phone] phone  [facility] [address], [address] [place], [place], [address] [phone] phone  [facility] [address] [place], [place], [address] [phone] phone  [facility] - THERAPY ONLY [address] [place], [address] [phone] phone  [facility], [place] [address], [place], [address] [phone] phone  [facility] [address] [place], [place], [address] [phone] phone [phone] fax  [facility] [address] [place], [place], [address] [phone] phone [phone] fax  [employer] [phone] N[address] [place], [place], [address] [phone] phone  [facility] [address] [place], [place], [address]  [phone] phone  [facility]  (Adults Only) [address] [place], [place], [address]  [phone] phone [phone] fax

## 2023-01-16 NOTE — ED Notes (Signed)
Patient discharged by provider Erskine Emery, NP. Resources provider.

## 2023-01-16 NOTE — ED Provider Notes (Signed)
Behavioral Health Urgent Care Medical Screening Exam  Patient Name: Kelly Knight MRN: [id number] Date of Evaluation: 01/16/23 Chief Complaint:  "I just don't have anyone to talk to" Diagnosis:  Final diagnoses:  Adjustment disorder with mixed anxiety and depressed mood   History of Present Illness: Kelly Knight is a 27 y.o. male patient with a past psychiatric history of adjustment disorder with mixed anxiety and depressed mood who [facility] and [facility] for a walk-in assessment with complaints of worsening depression.  Patient assessed face-to-face by this provider and chart reviewed on 01/16/23. On evaluation, Kelly Knight is seated in assessment area in no acute distress. Patient is alert and oriented x4, calm, cooperative, and pleasant during assessment. Patient's responses were appropriate to assessment questions asked. Speech is clear and coherent, normal rate and volume. Patient appears well-groomed. Eye contact is good. Mood is depressed with flat and congruent affect. Patient denies suicidal and homicidal ideations and easily contracts verbally for safety with this Clinical research associatewriter. Patient reports a history of 1 suicide attempt by cutting his arm 10 years ago. Patient reports a history of self-harm by cutting, last time was in July 2023. Patient denies past inpatient psychiatric hospitalizations. Patient denies auditory and visual hallucinations. Patient denies symptoms of paranoia. Patient is able to converse coherently with goal-directed thoughts and no distractibility or preoccupation. Objectively, there is no evidence of psychosis/mania, delusional thinking, or indication that patient is responding to internal/external stimuli.  Patient endorses fair but varying sleep and good appetite. Patient lives alone in an apartment [place] with his dog. Patient denies access to weapons. Patient is employed full-time in Financial tradercustomer service/insurance. Patient  endorses occasional use of marijuana, last use was Sunday. Patient denies use of alcohol or other illicit substances. Patient states that he does not currently have outpatient psychiatric services for therapy or medication management in place. Patient states he last received therapy from Infect[facility] on [address] "a few years ago" and was started on Remeron. Patient reports he currently takes Remeron every night (he is unsure of the dose) to help with sleep and nausea. Patient states he has experienced increased depression, anxiety, and difficulty sleeping lately due to recent health concerns related to his HIV at the end of 2023 and "a recent infection in my stool." Patient states he has missed work due to his health concerns, which resulted in him being late on his rent payments. Patient states he went to court today because of his possible upcoming eviction and tried to explain that he is behind on rent because of his health issues. Patient states the court encouraged him to discuss payment options with the apartment Investment banker, corporateproperty manager. Patient states he is going to the apartment office tomorrow to discuss payment options and plans to make 2 payments within the next 4 days to catch up on his rent. Patient states [facility] helped him with his rent last year and can help him again. Patient states, "I have no idea where I'll go in 10 days" if the apartment does not allow him to make payments. Patient states his mother is supportive, but that his mother and father are divorcing and his mother is currently staying with family and there is not room for another person. Patient states, "I'm praying everything will be ok, I just don't have anyone to talk to." Patient states he is interested in starting group therapy.  Patient offered support and encouragement. Discussed outpatient psychiatric resources provided in AVS for therapy, medication  management, and IOP. Patient is in agreement with plan of care  and states he plans to come to [facility] 2nd floor outpatient services tomorrow during walk-in hours.   At this time, Kelly Knight is educated and verbalizes understanding of mental health resources and other crisis services in the community. He is instructed to call 911 and present to the nearest emergency room should he experience any suicidal/homicidal ideation, auditory/visual/hallucinations, or detrimental worsening of his mental health condition. He was a also advised by Clinical research associate that he could call the toll-free phone on back of  insurance card to assist with identifying in network services and agencies or the number on back of Medicaid card to speak with care coordinator. Flowsheet Row ED from 01/16/2023 in [facility] ED from 01/10/2023 in [facility] at [facility] ED from 10/14/2022 in [facility] Emergency Department at [facility]  C-SSRS RISK CATEGORY No Risk No Risk No Risk       Psychiatric Specialty Exam  Presentation  General Appearance:Appropriate for Environment; Casual; Well Groomed  Eye Contact:Good  Speech:Clear and Coherent; Normal Rate  Speech Volume:Normal  Handedness:Right   Mood and Affect  Mood: Depressed  Affect: Congruent   Thought Process  Thought Processes: Coherent; Goal Directed  Descriptions of Associations:Intact  Orientation:Full (Time, Place and Person)  Thought Content:Logical    Hallucinations:None  Ideas of Reference:None  Suicidal Thoughts:No Without Intent; Without Plan  Homicidal Thoughts:No   Sensorium  Memory: Immediate Good; Recent Good; Remote Good  Judgment: Good  Insight: Good   Executive Functions  Concentration: Good  Attention Span: Good  Recall: Good  Fund of Knowledge: Good  Language: Good   Psychomotor Activity  Psychomotor Activity: Normal   Assets  Assets: Communication Skills; Desire for Improvement; Financial Resources/Insurance;  Housing; Physical Health; Resilience; Transportation   Sleep  Sleep: Fair  Number of hours:  6   Physical Exam: Physical Exam Vitals and nursing note reviewed.  Constitutional:      General: He is not in acute distress.    Appearance: Normal appearance. He is not ill-appearing.  HENT:     Head: Normocephalic and atraumatic.     Nose: Nose normal.  Eyes:     General:        Right eye: No discharge.        Left eye: No discharge.     Conjunctiva/sclera: Conjunctivae normal.  Cardiovascular:     Rate and Rhythm: Normal rate.  Pulmonary:     Effort: Pulmonary effort is normal. No respiratory distress.  Musculoskeletal:        General: Normal range of motion.     Cervical back: Normal range of motion.  Skin:    General: Skin is warm and dry.  Neurological:     General: No focal deficit present.     Mental Status: He is alert and oriented to person, place, and time. Mental status is at baseline.  Psychiatric:        Attention and Perception: Attention and perception normal.        Mood and Affect: Mood is depressed. Affect is flat.        Speech: Speech normal.        Behavior: Behavior normal. Behavior is cooperative.        Thought Content: Thought content normal.        Cognition and Memory: Cognition and memory normal.        Judgment: Judgment normal.  Review of Systems  Constitutional: Negative.   HENT: Negative.    Eyes: Negative.   Respiratory: Negative.    Cardiovascular: Negative.   Gastrointestinal: Negative.   Genitourinary: Negative.   Musculoskeletal: Negative.   Skin: Negative.   Neurological: Negative.   Endo/Heme/Allergies: Negative.   Psychiatric/Behavioral:  Positive for depression. Negative for hallucinations, memory loss, substance abuse and suicidal ideas. The patient is not nervous/anxious and does not have insomnia.    Blood pressure 116/75, pulse 67, temperature 98.6 F (37 C), temperature source Oral, resp. rate 18, SpO2 100 %. There  is no height or weight on file to calculate BMI.  Musculoskeletal: Strength & Muscle Tone: within normal limits Gait & Station: normal Patient leans: N/A   [facility] MSE Discharge Disposition for Follow up and Recommendations: Based on my evaluation the patient does not appear to have an emergency medical condition and can be discharged with resources and follow up care in outpatient services for Medication Management, Individual Therapy, and Group Therapy   Sunday Corn, NP 01/16/2023, 7:20 PM

## 2023-01-16 NOTE — Progress Notes (Signed)
   01/16/23 1757  [facility] Triage Screening (Walk-ins at [facility] only)  How Did You Hear About [place]? Self  What Is the Reason for Your Visit/Call Today? ROUTINE: Kelly Knight is a 27 y/o male with a complaint of depression, anxiety, and insomnia. States that he is under a lot of stress and facing eviction from his apartment. He went to court today because of his eviction. He tried to explain during his court procession today that he is behind on rent because of previous health issues. Per patient, "I don't know where I will go in 10 days, I am scared". He has a Clinical biochemist job. However, because of his mental health symptoms and stress he wasn't able to work a full day at work today.  Denies current suicidal ideations. No access to weapons. Patient has a prior history of a suicide attempt (10 years ago). He has a hx of self injurious behaviors (cutting) and last occurrence was "the summer of 2023". Denies HI and AVH's. He reports use of THC. He says, "I only smoke when I get depressed or anxious" and "I don't smoke marijuana much....Marland KitchenMarland KitchenI stopped smoking because of my health".  Last use was 01-15-2023. He does not have a therapist/psychiatrist. He lives alone. His mom is supportive. Patient is very tearful during his triage/screening.  How Long Has This Been Causing You Problems? 1-6 months  Have You Recently Had Any Thoughts About Hurting Yourself? No  Are You Planning to Commit Suicide/Harm Yourself At This time? No  Have you Recently Had Thoughts About Hurting Someone [name]? No  Are You Planning To Harm Someone At This Time? No  Are you currently experiencing any auditory, visual or other hallucinations? No  Have You Used Any Alcohol or Drugs in the Past 24 Hours? No  Do you have any current medical co-morbidities that require immediate attention? No  Clinician description of patient physical appearance/behavior: Patient is dressed casually. Polite. Cooeperative.  What Do You Feel Would Help You the Most  Today? Treatment for Depression or other mood problem;Stress Management;Medication(s);Social Support;Housing Assistance;Financial Resources  If access to [facility] Urgent Care was not available, would you have sought care in the Emergency Department? Yes  Determination of Need Routine (7 days)  Options For Referral Medication Management;Intensive Outpatient Therapy;Partial Hospitalization

## 2023-01-19 ENCOUNTER — Ambulatory Visit (INDEPENDENT_AMBULATORY_CARE_PROVIDER_SITE_OTHER): Payer: Medicaid Other | Admitting: Psychiatry

## 2023-01-19 ENCOUNTER — Other Ambulatory Visit (HOSPITAL_COMMUNITY): Payer: Self-pay

## 2023-01-19 ENCOUNTER — Encounter (HOSPITAL_COMMUNITY): Payer: Self-pay | Admitting: Psychiatry

## 2023-01-19 VITALS — BP 122/71 | HR 60

## 2023-01-19 DIAGNOSIS — F1210 Cannabis abuse, uncomplicated: Secondary | ICD-10-CM | POA: Diagnosis not present

## 2023-01-19 DIAGNOSIS — F332 Major depressive disorder, recurrent severe without psychotic features: Secondary | ICD-10-CM

## 2023-01-19 MED ORDER — MIRTAZAPINE 15 MG PO TABS: 15.0000 mg | ORAL_TABLET | Freq: Every day | ORAL | 5 refills | Status: DC

## 2023-01-19 MED ORDER — HYDROXYZINE HCL 25 MG PO TABS: 25.0000 mg | ORAL_TABLET | Freq: Three times a day (TID) | ORAL | 1 refills | Status: DC | PRN

## 2023-01-19 MED ORDER — MIRTAZAPINE 15 MG PO TABS: 15.0000 mg | ORAL_TABLET | Freq: Every day | ORAL | 1 refills | Status: DC
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
## 2023-01-31 ENCOUNTER — Encounter: Payer: Self-pay | Admitting: Gastroenterology

## 2023-02-01 ENCOUNTER — Telehealth: Payer: 59 | Admitting: Physician Assistant

## 2023-02-01 ENCOUNTER — Telehealth: Payer: Medicaid Other | Admitting: Physician Assistant

## 2023-02-01 DIAGNOSIS — J4541 Moderate persistent asthma with (acute) exacerbation: Secondary | ICD-10-CM | POA: Diagnosis not present

## 2023-02-01 DIAGNOSIS — J208 Acute bronchitis due to other specified organisms: Secondary | ICD-10-CM | POA: Diagnosis not present

## 2023-02-01 DIAGNOSIS — J069 Acute upper respiratory infection, unspecified: Secondary | ICD-10-CM

## 2023-02-01 DIAGNOSIS — B9689 Other specified bacterial agents as the cause of diseases classified elsewhere: Secondary | ICD-10-CM | POA: Diagnosis not present

## 2023-02-01 MED ORDER — PROMETHAZINE-DM 6.25-15 MG/5ML PO SYRP: 5.0000 mL | ORAL_SOLUTION | Freq: Four times a day (QID) | ORAL | 0 refills | Status: DC | PRN

## 2023-02-01 MED ORDER — PREDNISONE 20 MG PO TABS: 40.0000 mg | ORAL_TABLET | Freq: Every day | ORAL | 0 refills | Status: DC

## 2023-02-01 MED ORDER — AZITHROMYCIN 250 MG PO TABS: ORAL_TABLET | ORAL | 0 refills | Status: AC

## 2023-02-01 NOTE — Progress Notes (Signed)
Mr. Kelly Knight, Kelly Knight are scheduled for a virtual visit with your provider today.    Just as we do with appointments in the office, we must obtain your consent to participate.  Your consent will be active for this visit and any virtual visit you may have with one of our providers in the next 365 days.    If you have a MyChart account, I can also send a copy of this consent to you electronically.  All virtual visits are billed to your insurance company just like a traditional visit in the office.  As this is a virtual visit, video technology does not allow for your provider to perform a traditional examination.  This may limit your provider's ability to fully assess your condition.  If your provider identifies any concerns that need to be evaluated in person or the need to arrange testing such as labs, EKG, etc, we will make arrangements to do so.    Although advances in technology are sophisticated, we cannot ensure that it will always work on either your end or our end.  If the connection with a video visit is poor, we may have to switch to a telephone visit.  With either a video or telephone visit, we are not always able to ensure that we have a secure connection.   I need to obtain your verbal consent now.   Are you willing to proceed with your visit today?   Kelly Knight has provided verbal consent on 02/01/2023 for a virtual visit (video or telephone).   Karrie Meres, [place] 02/01/2023  6:12 PM   Date:  02/01/2023   ID:  Kelly Knight, DOB [birth year], MRN [id number]  Patient Location: Home Provider Location: Home Office   Participants: Patient and Provider for Visit and Wrap up  Method of visit: Video  Location of Patient: Home Location of Provider: Home Office Consent was obtain for visit over the video. Services rendered by provider: Visit was performed via video  A video enabled telemedicine application was used and I verified that I am speaking with the correct person using  two identifiers.  PCP:  Patient, No Pcp Per   Chief Complaint:  work note  History of Present Illness:    Kelly Knight is a 27 y.o. male with history as stated below. Presents video telehealth for an acute care visit  States he had a video visit earlier today and was diagnosed with bacterial bronchitis. He was given an rx for medication. He fell asleep after the visit and when he woke up he felt worse. He is having worse congestion and cough. He has feels more short of breath. He does not have a fever. He is requesting to get a different work so that he can go back to work later.  Past Medical, Surgical, Social History, Allergies, and Medications have been Reviewed.  Past Medical History:  Diagnosis Date   Asthma    Heart murmur    HIV (human immunodeficiency virus infection) 01/23/2019   Situs inversus    Syphilis 01/23/2019    No outpatient medications have been marked as taking for the 02/01/23 encounter (Video Visit) with [facility] PROVIDER.     Allergies:   Shrimp [shellfish allergy] and Sulfa antibiotics   ROS See HPI for history of present illness.  Physical Exam Constitutional:      Appearance: Normal appearance. He is not toxic-appearing.  HENT:     Nose: Congestion present.  Eyes:     Comments: Clear  tearing from both eyes  Neurological:     Mental Status: He is alert.               There are no diagnoses linked to this encounter.   Time:   Today, I have spent 10 minutes with the patient with telehealth technology discussing the above problems, reviewing the chart, previous notes, medications and orders.    Tests Ordered: No orders of the defined types were placed in this encounter.   Medication Changes: No orders of the defined types were placed in this encounter.    Disposition:  Follow up  Signed, Karrie Meres, PA-C  02/01/2023 6:12 PM

## 2023-02-01 NOTE — Progress Notes (Signed)
Virtual Visit Consent   SALAR MOLDEN, you are scheduled for a virtual visit with a [facility] provider today. Just as with appointments in the office, your consent must be obtained to participate. Your consent will be active for this visit and any virtual visit you may have with one of our providers in the next 365 days. If you have a MyChart account, a copy of this consent can be sent to you electronically.  As this is a virtual visit, video technology does not allow for your provider to perform a traditional examination. This may limit your provider's ability to fully assess your condition. If your provider identifies any concerns that need to be evaluated in person or the need to arrange testing (such as labs, EKG, etc.), we will make arrangements to do so. Although advances in technology are sophisticated, we cannot ensure that it will always work on either your end or our end. If the connection with a video visit is poor, the visit may have to be switched to a telephone visit. With either a video or telephone visit, we are not always able to ensure that we have a secure connection.  By engaging in this virtual visit, you consent to the provision of healthcare and authorize for your insurance to be billed (if applicable) for the services provided during this visit. Depending on your insurance coverage, you may receive a charge related to this service.  I need to obtain your verbal consent now. Are you willing to proceed with your visit today? [name] has provided verbal consent on 02/01/2023 for a virtual visit (video or telephone). Margaretann Loveless, PA-C  Date: 02/01/2023 10:50 AM  Virtual Visit via Video Note   I, Margaretann Loveless, connected with  Kelly Knight  ([id number], 02-12-1996) on 02/01/23 at 10:45 AM EDT by a video-enabled telemedicine application and verified that I am speaking with the correct person using two identifiers.  Location: Patient: Virtual Visit  Location Patient: Home Provider: Virtual Visit Location Provider: Home Office   I discussed the limitations of evaluation and management by telemedicine and the availability of in person appointments. The patient expressed understanding and agreed to proceed.    History of Present Illness: Kelly Knight is a 27 y.o. who identifies as a male who was assigned male at birth, and is being seen today for cough.  HPI: Cough This is a new problem. The current episode started in the past 7 days. The problem has been gradually worsening. The cough is Productive of sputum and productive of purulent sputum. Associated symptoms include chest pain (tightness), chills, a fever (low grade), headaches, myalgias, nasal congestion, postnasal drip, rhinorrhea (and post nasal drainange), a sore throat and wheezing. Pertinent negatives include no ear congestion or ear pain. The symptoms are aggravated by lying down. He has tried a beta-agonist inhaler for the symptoms. The treatment provided no relief. His past medical history is significant for asthma.      Problems:  Patient Active Problem List   Diagnosis Date Noted   Adjustment disorder with mixed anxiety and depressed mood 08/16/2022   Uncomplicated asthma 10/29/2021   Situs inversus abdominalis 05/17/2021   GERD with esophagitis 01/21/2021   Environmental allergies 07/05/2020   Anxiety and depression 05/10/2019   HIV (human immunodeficiency virus infection) 01/23/2019   History of syphilis 01/23/2019    Allergies:  Allergies  Allergen Reactions   Shrimp [Shellfish Allergy] Itching and Swelling    Throat swelling  Sulfa Antibiotics Rash   Medications:  Current Outpatient Medications:    albuterol (PROVENTIL) (2.5 MG/3ML) 0.083% nebulizer solution, Take 2.5 mg by nebulization every 6 (six) hours as needed for wheezing., Disp: , Rfl:    albuterol (VENTOLIN HFA) 108 (90 Base) MCG/ACT inhaler, Inhale 2 puffs into the lungs every 6 (six) hours as  needed for wheezing or shortness of breath., Disp: 18 g, Rfl: 6   aspirin-acetaminophen-caffeine (EXCEDRIN MIGRAINE) 250-250-65 MG tablet, Take 2 tablets by mouth 3 (three) times daily as needed for headache or migraine., Disp: , Rfl:    azithromycin (ZITHROMAX) 250 MG tablet, Take 2 tablets on day 1, then 1 tablet daily on days 2 through 5, Disp: 6 tablet, Rfl: 0   dolutegravir (TIVICAY) 50 MG tablet, Take 1 tablet (50 mg total) by mouth daily after supper., Disp: 30 tablet, Rfl: 11   emtricitabine-tenofovir AF (DESCOVY) 200-25 MG tablet, Take 1 tablet by mouth daily after supper., Disp: 30 tablet, Rfl: 11   hydrOXYzine (ATARAX) 25 MG tablet, Take 1 tablet (25 mg total) by mouth 3 (three) times daily as needed for anxiety., Disp: 90 tablet, Rfl: 1   mirtazapine (REMERON) 15 MG tablet, Take 1 tablet (15 mg total) by mouth at bedtime., Disp: 30 tablet, Rfl: 1   omeprazole (PRILOSEC) 40 MG capsule, Take 1 capsule (40 mg total) by mouth 2 (two) times daily. 30 min before breakfast and dinner, Disp: 60 capsule, Rfl: 1   predniSONE (DELTASONE) 20 MG tablet, Take 2 tablets (40 mg total) by mouth daily with breakfast., Disp: 10 tablet, Rfl: 0   promethazine-dextromethorphan (PROMETHAZINE-DM) 6.25-15 MG/5ML syrup, Take 5 mLs by mouth 4 (four) times daily as needed., Disp: 118 mL, Rfl: 0  Observations/Objective: Patient is well-developed, well-nourished in no acute distress.  Resting comfortably at home.  Head is normocephalic, atraumatic.  No labored breathing.  Speech is clear and coherent with logical content.  Patient is alert and oriented at baseline.    Assessment and Plan: 1. Acute bacterial bronchitis - azithromycin (ZITHROMAX) 250 MG tablet; Take 2 tablets on day 1, then 1 tablet daily on days 2 through 5  Dispense: 6 tablet; Refill: 0 - promethazine-dextromethorphan (PROMETHAZINE-DM) 6.25-15 MG/5ML syrup; Take 5 mLs by mouth 4 (four) times daily as needed.  Dispense: 118 mL; Refill: 0  2.  Moderate persistent asthma with exacerbation - azithromycin (ZITHROMAX) 250 MG tablet; Take 2 tablets on day 1, then 1 tablet daily on days 2 through 5  Dispense: 6 tablet; Refill: 0 - predniSONE (DELTASONE) 20 MG tablet; Take 2 tablets (40 mg total) by mouth daily with breakfast.  Dispense: 10 tablet; Refill: 0  - Worsening over a week despite OTC medications with asthma symptoms increasing - Will treat with Z-pack, Prednisone and Promethazine DM - Can continue Mucinex  - Push fluids.  - Rest.  - Steam and humidifier can help - Seek in person evaluation if worsening or symptoms fail to improve    Follow Up Instructions: I discussed the assessment and treatment plan with the patient. The patient was provided an opportunity to ask questions and all were answered. The patient agreed with the plan and demonstrated an understanding of the instructions.  A copy of instructions were sent to the patient via MyChart unless otherwise noted below.    The patient was advised to call back or seek an in-person evaluation if the symptoms worsen or if the condition fails to improve as anticipated.  Time:  I spent 10 minutes with  the patient via telehealth technology discussing the above problems/concerns.    Mar Daring, PA-C

## 2023-02-01 NOTE — Patient Instructions (Signed)
Kelly Knight, thank you for joining Margaretann Loveless, PA-C for today's virtual visit.  While this provider is not your primary care provider (PCP), if your PCP is located in our provider database this encounter information will be shared with them immediately following your visit.   A [facility] MyChart account gives you access to today's visit and all your visits, tests, and labs performed at [facility] " click here if you don't have a [facility] MyChart account or go to [email or web address]  Consent: (Patient) Kelly Knight provided verbal consent for this virtual visit at the beginning of the encounter.  Current Medications:  Current Outpatient Medications:    albuterol (PROVENTIL) (2.5 MG/3ML) 0.083% nebulizer solution, Take 2.5 mg by nebulization every 6 (six) hours as needed for wheezing., Disp: , Rfl:    albuterol (VENTOLIN HFA) 108 (90 Base) MCG/ACT inhaler, Inhale 2 puffs into the lungs every 6 (six) hours as needed for wheezing or shortness of breath., Disp: 18 g, Rfl: 6   aspirin-acetaminophen-caffeine (EXCEDRIN MIGRAINE) 250-250-65 MG tablet, Take 2 tablets by mouth 3 (three) times daily as needed for headache or migraine., Disp: , Rfl:    azithromycin (ZITHROMAX) 250 MG tablet, Take 2 tablets on day 1, then 1 tablet daily on days 2 through 5, Disp: 6 tablet, Rfl: 0   dolutegravir (TIVICAY) 50 MG tablet, Take 1 tablet (50 mg total) by mouth daily after supper., Disp: 30 tablet, Rfl: 11   emtricitabine-tenofovir AF (DESCOVY) 200-25 MG tablet, Take 1 tablet by mouth daily after supper., Disp: 30 tablet, Rfl: 11   hydrOXYzine (ATARAX) 25 MG tablet, Take 1 tablet (25 mg total) by mouth 3 (three) times daily as needed for anxiety., Disp: 90 tablet, Rfl: 1   mirtazapine (REMERON) 15 MG tablet, Take 1 tablet (15 mg total) by mouth at bedtime., Disp: 30 tablet, Rfl: 1   omeprazole (PRILOSEC) 40 MG capsule, Take 1 capsule (40 mg total) by mouth 2 (two) times  daily. 30 min before breakfast and dinner, Disp: 60 capsule, Rfl: 1   predniSONE (DELTASONE) 20 MG tablet, Take 2 tablets (40 mg total) by mouth daily with breakfast., Disp: 10 tablet, Rfl: 0   promethazine-dextromethorphan (PROMETHAZINE-DM) 6.25-15 MG/5ML syrup, Take 5 mLs by mouth 4 (four) times daily as needed., Disp: 118 mL, Rfl: 0   Medications ordered in this encounter:  Meds ordered this encounter  Medications   azithromycin (ZITHROMAX) 250 MG tablet    Sig: Take 2 tablets on day 1, then 1 tablet daily on days 2 through 5    Dispense:  6 tablet    Refill:  0    Order Specific Question:   Supervising Provider    Answer:   Merrilee Jansky [[id number]]   predniSONE (DELTASONE) 20 MG tablet    Sig: Take 2 tablets (40 mg total) by mouth daily with breakfast.    Dispense:  10 tablet    Refill:  0    Order Specific Question:   Supervising Provider    Answer:   Merrilee Jansky [[id number]]   promethazine-dextromethorphan (PROMETHAZINE-DM) 6.25-15 MG/5ML syrup    Sig: Take 5 mLs by mouth 4 (four) times daily as needed.    Dispense:  118 mL    Refill:  0    Order Specific Question:   Supervising Provider    Answer:   Merrilee Jansky [id number]     *If you need refills on other medications prior to your next appointment, please contact your  pharmacy*  Follow-Up: Call back or seek an in-person evaluation if the symptoms worsen or if the condition fails to improve as anticipated.  [facility] [phone]  Other Instructions Acute Bronchitis, Adult  Acute bronchitis is sudden inflammation of the main airways (bronchi) that come off the windpipe (trachea) in the lungs. The swelling causes the airways to get smaller and make more mucus than normal. This can make it hard to breathe and can cause coughing or noisy breathing (wheezing). Acute bronchitis may last several weeks. The cough may last longer. Allergies, asthma, and exposure to smoke may make the condition  worse. What are the causes? This condition can be caused by germs and by substances that irritate the lungs, including: Cold and flu viruses. The most common cause of this condition is the virus that causes the common cold. Bacteria. This is less common. Breathing in substances that irritate the lungs, including: Smoke from cigarettes and other forms of tobacco. Dust and pollen. Fumes from household cleaning products, gases, or burned fuel. Indoor or outdoor air pollution. What increases the risk? The following factors may make you more likely to develop this condition: A weak body's defense system, also called the immune system. A condition that affects your lungs and breathing, such as asthma. What are the signs or symptoms? Common symptoms of this condition include: Coughing. This may bring up clear, yellow, or green mucus from your lungs (sputum). Wheezing. Runny or stuffy nose. Having too much mucus in your lungs (chest congestion). Shortness of breath. Aches and pains, including sore throat or chest. How is this diagnosed? This condition is usually diagnosed based on: Your symptoms and medical history. A physical exam. You may also have other tests, including tests to rule out other conditions, such as pneumonia. These tests include: A test of lung function. Test of a mucus sample to look for the presence of bacteria. Tests to check the oxygen level in your blood. Blood tests. Chest X-ray. How is this treated? Most cases of acute bronchitis clear up over time without treatment. Your health care provider may recommend: Drinking more fluids to help thin your mucus so it is easier to cough up. Taking inhaled medicine (inhaler) to improve air flow in and out of your lungs. Using a vaporizer or a humidifier. These are machines that add water to the air to help you breathe better. Taking a medicine that thins mucus and clears congestion (expectorant). Taking a medicine that  prevents or stops coughing (cough suppressant). It is not common to take an antibiotic medicine for this condition. Follow these instructions at home:  Take over-the-counter and prescription medicines only as told by your health care provider. Use an inhaler, vaporizer, or humidifier as told by your health care provider. Take two teaspoons (10 mL) of honey at bedtime to lessen coughing at night. Drink enough fluid to keep your urine pale yellow. Do not use any products that contain nicotine or tobacco. These products include cigarettes, chewing tobacco, and vaping devices, such as e-cigarettes. If you need help quitting, ask your health care provider. Get plenty of rest. Return to your normal activities as told by your health care provider. Ask your health care provider what activities are safe for you. Keep all follow-up visits. This is important. How is this prevented? To lower your risk of getting this condition again: Wash your hands often with soap and water for at least 20 seconds. If soap and water are not available, use hand sanitizer.  Avoid contact with people who have cold symptoms. Try not to touch your mouth, nose, or eyes with your hands. Avoid breathing in smoke or chemical fumes. Breathing smoke or chemical fumes will make your condition worse. Get the flu shot every year. Contact a health care provider if: Your symptoms do not improve after 2 weeks. You have trouble coughing up the mucus. Your cough keeps you awake at night. You have a fever. Get help right away if you: Cough up blood. Feel pain in your chest. Have severe shortness of breath. Faint or keep feeling like you are going to faint. Have a severe headache. Have a fever or chills that get worse. These symptoms may represent a serious problem that is an emergency. Do not wait to see if the symptoms will go away. Get medical help right away. Call your local emergency services (911 in the [place] Do not drive  yourself to the hospital. Summary Acute bronchitis is inflammation of the main airways (bronchi) that come off the windpipe (trachea) in the lungs. The swelling causes the airways to get smaller and make more mucus than normal. Drinking more fluids can help thin your mucus so it is easier to cough up. Take over-the-counter and prescription medicines only as told by your health care provider. Do not use any products that contain nicotine or tobacco. These products include cigarettes, chewing tobacco, and vaping devices, such as e-cigarettes. If you need help quitting, ask your health care provider. Contact a health care provider if your symptoms do not improve after 2 weeks. This information is not intended to replace advice given to you by your health care provider. Make sure you discuss any questions you have with your health care provider. Document Revised: 01/06/2022 Document Reviewed: 01/27/2021 Elsevier Patient Education  2023 Elsevier Inc.    If you have been instructed to have an in-person evaluation today at a local Urgent Care facility, please use the link below. It will take you to a list of all of our available [facility], including address, phone number and hours of operation. Please do not delay care.  [facility]  If you or a family member do not have a primary care provider, use the link below to schedule a visit and establish care. When you choose a [facility] primary care physician or advanced practice provider, you gain a long-term partner in health. Find a Primary Care Provider  Learn more about [facility]'s in-office and virtual care options: [facility] Get Care Now

## 2023-02-07 ENCOUNTER — Ambulatory Visit (HOSPITAL_COMMUNITY)
Admission: RE | Admit: 2023-02-07 | Discharge: 2023-02-07 | Disposition: A | Discharge status: Home / Self Care | Payer: 59 | Source: Ambulatory Visit | Attending: Emergency Medicine | Admitting: Emergency Medicine

## 2023-02-07 ENCOUNTER — Encounter (HOSPITAL_COMMUNITY): Payer: Self-pay

## 2023-02-07 ENCOUNTER — Ambulatory Visit (HOSPITAL_COMMUNITY): Payer: Medicaid Other

## 2023-02-07 VITALS — BP 113/73 | HR 94 | Temp 98.6°F | Resp 18

## 2023-02-07 DIAGNOSIS — J069 Acute upper respiratory infection, unspecified: Secondary | ICD-10-CM

## 2023-02-07 DIAGNOSIS — J4520 Mild intermittent asthma, uncomplicated: Secondary | ICD-10-CM | POA: Diagnosis not present

## 2023-02-07 MED ORDER — ALBUTEROL SULFATE HFA 108 (90 BASE) MCG/ACT IN AERS
INHALATION_SPRAY | RESPIRATORY_TRACT | Status: AC
  Filled 2023-02-07: qty 6.7

## 2023-02-07 MED ORDER — ALBUTEROL SULFATE HFA 108 (90 BASE) MCG/ACT IN AERS
2.0000 | INHALATION_SPRAY | Freq: Once | RESPIRATORY_TRACT | Status: AC
  Administered 2023-02-07: 2 via RESPIRATORY_TRACT

## 2023-02-07 MED ORDER — DEXTROMETHORPHAN POLISTIREX ER 30 MG/5ML PO SUER: 30.0000 mg | Freq: Two times a day (BID) | ORAL | 0 refills | Status: DC

## 2023-02-07 MED ORDER — ALBUTEROL SULFATE HFA 108 (90 BASE) MCG/ACT IN AERS: 2.0000 | INHALATION_SPRAY | Freq: Four times a day (QID) | RESPIRATORY_TRACT | 1 refills | Status: AC | PRN

## 2023-02-07 NOTE — ED Triage Notes (Signed)
Here for asthma flare-up and cough. Pt states he was seen 1-2 weeks ago with similar symptoms. Pt states his nebulizer is broke.

## 2023-02-07 NOTE — Discharge Instructions (Addendum)
Please use the inhaler or nebulizer 3 times daily (every 6 hours) for the next several days. Then continue as needed.  You can continue the promethazine DM just before bedtime. Use Delsym (plain dextromethorphan) in the morning and afternoon for cough suppressant. This one shouldn't make you drowsy!  Please follow-up with your primary care provider

## 2023-02-07 NOTE — ED Provider Notes (Signed)
[facility]    CSN: [id number] Arrival date & time: 02/07/23  4098      History   Chief Complaint Chief Complaint  Patient presents with   Chest Injury    Requesting chest xray - Entered by patient   Asthma    HPI Kelly Knight is a 27 y.o. male.  Here with cough He had e-visit on 4/24 after about a week of cough, congestion, chest discomfort, wheezing. He was started on azithromycin, prednisone, and promethazine DM. He has improved much since then and reports cough improved. No longer wheezing. He has some chest tightness in the mornings and if laying flat. Just wanted to be seen in person. Hx of asthma, does not have an albuterol inhaler and his nebulizer tubing is broken. No recent fevers  Past Medical History:  Diagnosis Date   Asthma    Heart murmur    HIV (human immunodeficiency virus infection) (HCC) 01/23/2019   Situs inversus    Syphilis 01/23/2019    Patient Active Problem List   Diagnosis Date Noted   Adjustment disorder with mixed anxiety and depressed mood 08/16/2022   Uncomplicated asthma 10/29/2021   Situs inversus abdominalis 05/17/2021   GERD with esophagitis 01/21/2021   Environmental allergies 07/05/2020   Anxiety and depression 05/10/2019   HIV (human immunodeficiency virus infection) (HCC) 01/23/2019   History of syphilis 01/23/2019    History reviewed. No pertinent surgical history.     Home Medications    Prior to Admission medications   Medication Sig Start Date End Date Taking? Authorizing Provider  albuterol (PROVENTIL) (2.5 MG/3ML) 0.083% nebulizer solution Take 2.5 mg by nebulization every 6 (six) hours as needed for wheezing.   Yes [provider]  albuterol (VENTOLIN HFA) 108 (90 Base) MCG/ACT inhaler Inhale 2 puffs into the lungs every 6 (six) hours as needed for wheezing or shortness of breath. 02/07/23  Yes Diangelo Radel, Lurena Joiner, PA-C  dextromethorphan (DELSYM) 30 MG/5ML liquid Take 5 mLs (30 mg total) by mouth 2  (two) times daily. 02/07/23  Yes Garrell Flagg, Lurena Joiner, PA-C  dolutegravir (TIVICAY) 50 MG tablet Take 1 tablet (50 mg total) by mouth daily after supper. 10/27/22  Yes Blanchard Kelch, NP  emtricitabine-tenofovir AF (DESCOVY) 200-25 MG tablet Take 1 tablet by mouth daily after supper. 10/27/22  Yes Blanchard Kelch, NP  mirtazapine (REMERON) 15 MG tablet Take 1 tablet (15 mg total) by mouth at bedtime. 01/19/23  Yes Karsten Ro, MD  omeprazole (PRILOSEC) 40 MG capsule Take 1 capsule (40 mg total) by mouth 2 (two) times daily. 30 min before breakfast and dinner 10/27/22  Yes Blanchard Kelch, NP  promethazine-dextromethorphan (PROMETHAZINE-DM) 6.25-15 MG/5ML syrup Take 5 mLs by mouth 4 (four) times daily as needed. 02/01/23  Yes Margaretann Loveless, PA-C  aspirin-acetaminophen-caffeine (EXCEDRIN MIGRAINE) [phone] MG tablet Take 2 tablets by mouth 3 (three) times daily as needed for headache or migraine.    [provider]  hydrOXYzine (ATARAX) 25 MG tablet Take 1 tablet (25 mg total) by mouth 3 (three) times daily as needed for anxiety. 01/19/23   Karsten Ro, MD    Family History Family History  Problem Relation Age of Onset   Diabetes Mother    Hypertension Mother    Hypertension Father     Social History Social History   Tobacco Use   Smoking status: Former    Types: Cigarettes    Passive exposure: Never   Smokeless tobacco: Never   Tobacco comments:  Stopped vaping   Vaping Use   Vaping Use: Former   Substances: THC  Substance Use Topics   Alcohol use: Not Currently    Comment: socially    Drug use: Not Currently    Frequency: 7.0 times per week    Types: Marijuana     Allergies   Shrimp [shellfish allergy] and Sulfa antibiotics   Review of Systems Review of Systems As per HPI  Physical Exam Triage Vital Signs ED Triage Vitals [02/07/23 1009]  Enc Vitals Group     BP 113/73     Pulse Rate 94     Resp 18     Temp 98.6 F (37 C)     Temp Source  Oral     SpO2 96 %     Weight      Height      Head Circumference      Peak Flow      Pain Score      Pain Loc      Pain Edu?      Excl. in GC?    No data found.  Updated Vital Signs BP 113/73 (BP Location: Left Arm)   Pulse 94   Temp 98.6 F (37 C) (Oral)   Resp 18   SpO2 96%    Physical Exam Vitals and nursing note reviewed.  Constitutional:      General: He is not in acute distress.    Appearance: He is not ill-appearing.  HENT:     Nose: No congestion or rhinorrhea.     Mouth/Throat:     Mouth: Mucous membranes are moist.     Pharynx: Oropharynx is clear. No posterior oropharyngeal erythema.  Eyes:     Conjunctiva/sclera: Conjunctivae normal.  Cardiovascular:     Rate and Rhythm: Normal rate and regular rhythm.     Pulses: Normal pulses.     Heart sounds: Normal heart sounds.  Pulmonary:     Effort: Pulmonary effort is normal. No respiratory distress.     Breath sounds: Normal breath sounds. No wheezing, rhonchi or rales.  Musculoskeletal:        General: Normal range of motion.     Cervical back: Normal range of motion.  Lymphadenopathy:     Cervical: No cervical adenopathy.  Skin:    General: Skin is warm and dry.  Neurological:     Mental Status: He is alert and oriented to person, place, and time.     UC Treatments / Results  Labs (all labs ordered are listed, but only abnormal results are displayed) Labs Reviewed - No data to display  EKG  Radiology No results found.  Procedures Procedures (including critical care time)  Medications Ordered in UC Medications  albuterol (VENTOLIN HFA) 108 (90 Base) MCG/ACT inhaler 2 puff (2 puffs Inhalation Given 02/07/23 1036)    Initial Impression / Assessment and Plan / UC Course  I have reviewed the triage vital signs and the nursing notes.  Pertinent labs & imaging results that were available during my care of the patient were reviewed by me and considered in my medical decision making (see chart  for details).  Afebrile and well-appearing.  No respiratory distress, lungs are clear.  Discussed no indication for x-ray at this time but offered given his 2 weeks of symptoms.  Shared decision making will defer at this time, especially given improvement after prednisone and antibiotic. Inhaler and replacement nebulizer tubing provided in clinic.  Recommend to use one or the  other every 6 hours for the next several days to open up the lungs.  Can continue Promethazine DM.  Reports it makes him drowsy, recommend trying just plain dextromethorphan morning and afternoon.  Work note provided.  Strict return precautions, patient agreeable with plan  Final Clinical Impressions(s) / UC Diagnoses   Final diagnoses:  Mild intermittent asthma, unspecified whether complicated  Viral URI with cough     Discharge Instructions      Please use the inhaler or nebulizer 3 times daily (every 6 hours) for the next several days. Then continue as needed.  You can continue the promethazine DM just before bedtime. Use Delsym (plain dextromethorphan) in the morning and afternoon for cough suppressant. This one shouldn't make you drowsy!  Please follow-up with your primary care provider     ED Prescriptions     Medication Sig Dispense Auth. Provider   albuterol (VENTOLIN HFA) 108 (90 Base) MCG/ACT inhaler Inhale 2 puffs into the lungs every 6 (six) hours as needed for wheezing or shortness of breath. 8 g Jaleal Schliep, PA-C   dextromethorphan (DELSYM) 30 MG/5ML liquid Take 5 mLs (30 mg total) by mouth 2 (two) times daily. 89 mL Meyer Dockery, Lurena Joiner, PA-C      PDMP not reviewed this encounter.   Ziona Wickens, Lurena Joiner, [place] 02/07/23 1101

## 2023-02-15 ENCOUNTER — Other Ambulatory Visit: Payer: Self-pay

## 2023-02-15 ENCOUNTER — Other Ambulatory Visit: Payer: 59

## 2023-02-15 DIAGNOSIS — B20 Human immunodeficiency virus [HIV] disease: Secondary | ICD-10-CM

## 2023-02-16 ENCOUNTER — Other Ambulatory Visit (HOSPITAL_COMMUNITY): Payer: Self-pay

## 2023-02-20 ENCOUNTER — Telehealth: Payer: Self-pay

## 2023-02-20 NOTE — Telephone Encounter (Signed)
Patient called stating that he was taking Tivicay and Descovy at his desk and one of his supervisor walked by and questioned what he was taking. He informed that why he takes the two medications. He stated his employer was wanting a letter saying that he can work with his condition. I informed patient that I don't believe they are allowed to do that but I would send a message back to [place].   Ilanna Deihl Lesli Albee, CMA

## 2023-02-20 NOTE — Telephone Encounter (Signed)
Patient aware of Stephanie's recommendation and will reach out to HR.   Allister Lessley Lesli Albee, CMA

## 2023-02-23 ENCOUNTER — Encounter (HOSPITAL_COMMUNITY): Payer: Medicaid Other | Admitting: Student

## 2023-02-24 ENCOUNTER — Other Ambulatory Visit (HOSPITAL_COMMUNITY): Payer: Self-pay

## 2023-02-27 ENCOUNTER — Telehealth: Payer: Self-pay

## 2023-02-27 ENCOUNTER — Other Ambulatory Visit (HOSPITAL_COMMUNITY): Payer: Self-pay

## 2023-02-27 NOTE — Telephone Encounter (Signed)
[facility] Patient Advocate Encounter  Prior Authorization for Descovy has been approved.    PA# [id number] BS Effective dates: 02/25/23 through 02/25/24  Patients co-pay is $2053.57.   [facility] will continue to follow.  Clearance Coots, CPhT [facility] Phone: [phone] Fax:  [phone]

## 2023-03-07 ENCOUNTER — Ambulatory Visit (HOSPITAL_COMMUNITY): Payer: Medicaid Other | Admitting: Clinical

## 2023-03-16 ENCOUNTER — Telehealth: Payer: Self-pay | Admitting: Pharmacy Technician

## 2023-03-16 NOTE — Telephone Encounter (Addendum)
Sent msg to pt

## 2023-03-31 ENCOUNTER — Other Ambulatory Visit: Payer: Self-pay

## 2023-04-12 ENCOUNTER — Ambulatory Visit: Payer: Medicaid Other | Admitting: Gastroenterology

## 2023-04-21 ENCOUNTER — Other Ambulatory Visit: Payer: Self-pay

## 2023-04-21 ENCOUNTER — Emergency Department (HOSPITAL_BASED_OUTPATIENT_CLINIC_OR_DEPARTMENT_OTHER)
Admission: EM | Admit: 2023-04-21 | Discharge: 2023-04-21 | Disposition: A | Discharge status: Home / Self Care | Payer: 59 | Attending: Emergency Medicine | Admitting: Emergency Medicine

## 2023-04-21 ENCOUNTER — Encounter (HOSPITAL_BASED_OUTPATIENT_CLINIC_OR_DEPARTMENT_OTHER): Payer: Self-pay | Admitting: Emergency Medicine

## 2023-04-21 ENCOUNTER — Telehealth (HOSPITAL_BASED_OUTPATIENT_CLINIC_OR_DEPARTMENT_OTHER): Payer: Self-pay | Admitting: Emergency Medicine

## 2023-04-21 ENCOUNTER — Other Ambulatory Visit (HOSPITAL_BASED_OUTPATIENT_CLINIC_OR_DEPARTMENT_OTHER): Payer: Self-pay

## 2023-04-21 DIAGNOSIS — Z7982 Long term (current) use of aspirin: Secondary | ICD-10-CM | POA: Insufficient documentation

## 2023-04-21 DIAGNOSIS — J45909 Unspecified asthma, uncomplicated: Secondary | ICD-10-CM | POA: Diagnosis not present

## 2023-04-21 DIAGNOSIS — Z87891 Personal history of nicotine dependence: Secondary | ICD-10-CM | POA: Diagnosis not present

## 2023-04-21 DIAGNOSIS — K0889 Other specified disorders of teeth and supporting structures: Secondary | ICD-10-CM | POA: Diagnosis present

## 2023-04-21 DIAGNOSIS — Z21 Asymptomatic human immunodeficiency virus [HIV] infection status: Secondary | ICD-10-CM | POA: Diagnosis not present

## 2023-04-21 MED ORDER — ACETAMINOPHEN 500 MG PO TABS
1000.0000 mg | ORAL_TABLET | Freq: Once | ORAL | Status: DC
  Filled 2023-04-21: qty 2

## 2023-04-21 MED ORDER — IBUPROFEN 800 MG PO TABS: 800.0000 mg | ORAL_TABLET | Freq: Once | ORAL | Status: DC

## 2023-04-21 MED ORDER — LIDOCAINE VISCOUS HCL 2 % MT SOLN
15.0000 mL | OROMUCOSAL | 0 refills | Status: DC | PRN
  Filled 2023-04-21: qty 100, 6d supply, fill #0

## 2023-04-21 MED ORDER — LIDOCAINE VISCOUS HCL 2 % MT SOLN
15.0000 mL | Freq: Once | OROMUCOSAL | Status: AC
  Administered 2023-04-21: 15 mL via OROMUCOSAL
  Filled 2023-04-21: qty 15

## 2023-04-21 MED ORDER — ACETAMINOPHEN 325 MG PO TABS
650.0000 mg | ORAL_TABLET | Freq: Four times a day (QID) | ORAL | 0 refills | Status: AC | PRN
  Filled 2023-04-21: qty 36, 5d supply, fill #0
  Filled 2023-04-21: qty 100, 13d supply, fill #0

## 2023-04-21 MED ORDER — AMOXICILLIN-POT CLAVULANATE 875-125 MG PO TABS
1.0000 | ORAL_TABLET | Freq: Two times a day (BID) | ORAL | 0 refills | Status: DC
  Filled 2023-04-21: qty 14, 7d supply, fill #0

## 2023-04-21 MED ORDER — OXYCODONE HCL 5 MG PO TABS
5.0000 mg | ORAL_TABLET | Freq: Four times a day (QID) | ORAL | 0 refills | Status: DC | PRN
  Filled 2023-04-21: qty 5, 2d supply, fill #0

## 2023-04-21 NOTE — Discharge Instructions (Addendum)
It was a pleasure caring for you today in the emergency department.  Please use Listerine swish and spit 3 times daily.  Please use Orajel topical numbing medication as needed.  Also you can gargle warm salt water and spit 3 times daily.  Please follow-up with dentist  Please return to the emergency department for any worsening or worrisome symptoms.

## 2023-04-21 NOTE — ED Provider Notes (Signed)
[place] EMERGENCY DEPARTMENT AT [facility] Provider Note  CSN: [id number] Arrival date & time: 04/21/23 4098  Chief Complaint(s) Dental Pain  HPI Kelly Knight is a 27 y.o. male with past medical history as below, significant for asthma, situs inversus, adjustment disorder, HIV (f/w ID) who presents to the ED with complaint of dental pain.  He reports left-sided lower jaw dental pain.  Pain at site of wisdom tooth.  Onset yesterday.  Initially had some swelling on the side of his face.  Since resolved.  No fevers.  Tolerant p.o. intake with difficulty.  No difficulty opening or closing his mouth but still having some pain to left side of his lower jaw.  Similar episode in the past secondary to wisdom tooth.  Past Medical History Past Medical History:  Diagnosis Date   Asthma    Heart murmur    HIV (human immunodeficiency virus infection) (HCC) 01/23/2019   Situs inversus    Syphilis 01/23/2019   Patient Active Problem List   Diagnosis Date Noted   Adjustment disorder with mixed anxiety and depressed mood 08/16/2022   Uncomplicated asthma 10/29/2021   Situs inversus abdominalis 05/17/2021   GERD with esophagitis 01/21/2021   Environmental allergies 07/05/2020   Anxiety and depression 05/10/2019   HIV (human immunodeficiency virus infection) (HCC) 01/23/2019   History of syphilis 01/23/2019   Home Medication(s) Prior to Admission medications   Medication Sig Start Date End Date Taking? Authorizing Provider  albuterol (PROVENTIL) (2.5 MG/3ML) 0.083% nebulizer solution Take 2.5 mg by nebulization every 6 (six) hours as needed for wheezing.    [provider]  albuterol (VENTOLIN HFA) 108 (90 Base) MCG/ACT inhaler Inhale 2 puffs into the lungs every 6 (six) hours as needed for wheezing or shortness of breath. 02/07/23   Rising, Lurena Joiner, PA-C  aspirin-acetaminophen-caffeine (EXCEDRIN MIGRAINE) [phone] MG tablet Take 2 tablets by mouth 3 (three) times daily as  needed for headache or migraine.    [provider]  dextromethorphan (DELSYM) 30 MG/5ML liquid Take 5 mLs (30 mg total) by mouth 2 (two) times daily. 02/07/23   Rising, Lurena Joiner, PA-C  dolutegravir (TIVICAY) 50 MG tablet Take 1 tablet (50 mg total) by mouth daily after supper. 10/27/22   Blanchard Kelch, NP  emtricitabine-tenofovir AF (DESCOVY) 200-25 MG tablet Take 1 tablet by mouth daily after supper. 10/27/22   Blanchard Kelch, NP  hydrOXYzine (ATARAX) 25 MG tablet Take 1 tablet (25 mg total) by mouth 3 (three) times daily as needed for anxiety. 01/19/23   Karsten Ro, MD  mirtazapine (REMERON) 15 MG tablet Take 1 tablet (15 mg total) by mouth at bedtime. 01/19/23   Karsten Ro, MD  omeprazole (PRILOSEC) 40 MG capsule Take 1 capsule (40 mg total) by mouth 2 (two) times daily. 30 min before breakfast and dinner 10/27/22   Blanchard Kelch, NP  promethazine-dextromethorphan (PROMETHAZINE-DM) 6.25-15 MG/5ML syrup Take 5 mLs by mouth 4 (four) times daily as needed. 02/01/23   Margaretann Loveless, PA-C  Past Surgical History History reviewed. No pertinent surgical history. Family History Family History  Problem Relation Age of Onset   Diabetes Mother    Hypertension Mother    Hypertension Father     Social History Social History   Tobacco Use   Smoking status: Former    Types: Cigarettes    Passive exposure: Never   Smokeless tobacco: Never   Tobacco comments:    Stopped vaping   Vaping Use   Vaping status: Former   Substances: THC  Substance Use Topics   Alcohol use: Not Currently    Comment: socially    Drug use: Not Currently    Frequency: 7.0 times per week    Types: Marijuana   Allergies Shrimp [shellfish allergy] and Sulfa antibiotics  Review of Systems Review of Systems  Constitutional:  Negative for chills and fever.  HENT:   Positive for dental problem. Negative for facial swelling and trouble swallowing.   Eyes:  Negative for photophobia and visual disturbance.  Respiratory:  Negative for cough and shortness of breath.   Cardiovascular:  Negative for chest pain and palpitations.  Gastrointestinal:  Negative for abdominal pain, nausea and vomiting.  Endocrine: Negative for polydipsia and polyuria.  Genitourinary:  Negative for difficulty urinating and hematuria.  Musculoskeletal:  Negative for gait problem and joint swelling.  Skin:  Negative for pallor and rash.  Neurological:  Negative for syncope and headaches.  Psychiatric/Behavioral:  Negative for agitation and confusion.     Physical Exam Vital Signs  I have reviewed the triage vital signs BP 116/79 (BP Location: Left Arm)   Pulse 95   Temp 98.7 F (37.1 C) (Oral)   Resp 15   Ht 5\' 9"  (1.753 m)   Wt 59.9 kg   SpO2 96%   BMI 19.49 kg/m  Physical Exam Vitals and nursing note reviewed.  Constitutional:      General: He is not in acute distress.    Appearance: Normal appearance. He is well-developed. He is not ill-appearing.  HENT:     Head: Normocephalic and atraumatic.     Jaw: There is normal jaw occlusion. No trismus or swelling.     Comments: No drooling trismus or stridor    Right Ear: External ear normal.     Left Ear: External ear normal.     Nose: Nose normal.     Mouth/Throat:     Mouth: Mucous membranes are moist.     Pharynx: Oropharynx is clear. Uvula midline.      Comments: No obvious dental abscess  Eyes:     General: No scleral icterus.       Right eye: No discharge.        Left eye: No discharge.  Cardiovascular:     Rate and Rhythm: Normal rate.  Pulmonary:     Effort: Pulmonary effort is normal. No respiratory distress.     Breath sounds: No stridor.  Abdominal:     General: Abdomen is flat. There is no distension.     Tenderness: There is no guarding.  Musculoskeletal:        General: No deformity.      Cervical back: No rigidity.  Lymphadenopathy:     Cervical: No cervical adenopathy.  Skin:    General: Skin is warm and dry.     Coloration: Skin is not cyanotic, jaundiced or pale.  Neurological:     Mental Status: He is alert and oriented to person, place, and time.  GCS: GCS eye subscore is 4. GCS verbal subscore is 5. GCS motor subscore is 6.  Psychiatric:        Speech: Speech normal.        Behavior: Behavior normal. Behavior is cooperative.     ED Results and Treatments Labs (all labs ordered are listed, but only abnormal results are displayed) Labs Reviewed - No data to display                                                                                                                        Radiology No results found.  Pertinent labs & imaging results that were available during my care of the patient were reviewed by me and considered in my medical decision making (see MDM for details).  Medications Ordered in ED Medications  acetaminophen (TYLENOL) tablet 1,000 mg (has no administration in time range)  ibuprofen (ADVIL) tablet 800 mg (has no administration in time range)  lidocaine (XYLOCAINE) 2 % viscous mouth solution 15 mL (has no administration in time range)                                                                                                                                     Procedures Procedures  (including critical care time)  Medical Decision Making / ED Course    Medical Decision Making:    TREYVIN GIRTEN is a 27 y.o. male with past medical history as below, significant for asthma, situs inversus, adjustment disorder, HIV (f/w ID) who presents to the ED with complaint of dental pain. . The complaint involves an extensive differential diagnosis and also carries with it a high risk of complications and morbidity.  Serious etiology was considered. Ddx includes but is not limited to: Dental pain, dental abscess, pharyngitis,  etc.  Complete initial physical exam performed, notably the patient  was no acute distress, breathing comfortably in ambient air, no drooling stridor or trismus.    Reviewed and confirmed nursing documentation for past medical history, family history, social history.  Vital signs reviewed.      Patient here with recurrent dental pain, he has been unable to see dentist following his prior ED visit with similar complaint.  Reports now he has Medicaid he is able to see dentist.  This will also hopefully help him pay for his HIV medication Will give analgesics here, oral antibiotics given he does have HIV,  dental resource handout  The patient improved significantly and was discharged in stable condition. Detailed discussions were had with the patient regarding current findings, and need for close f/u with PCP or on call doctor. The patient has been instructed to return immediately if the symptoms worsen in any way for re-evaluation. Patient verbalized understanding and is in agreement with current care plan. All questions answered prior to discharge.      Additional history obtained: -Additional history obtained from na -External records from outside source obtained and reviewed including: Chart review including previous notes, labs, imaging, consultation notes including prior ed visit/uc visits   Lab Tests: na  EKG   EKG Interpretation Date/Time:    Ventricular Rate:    PR Interval:    QRS Duration:    QT Interval:    QTC Calculation:   R Axis:      Text Interpretation:           Imaging Studies ordered: na   Medicines ordered and prescription drug management: Meds ordered this encounter  Medications   acetaminophen (TYLENOL) tablet 1,000 mg   ibuprofen (ADVIL) tablet 800 mg   lidocaine (XYLOCAINE) 2 % viscous mouth solution 15 mL    -I have reviewed the patients home medicines and have made adjustments as needed   Consultations Obtained: na   Cardiac  Monitoring: na  Social Determinants of Health:  Diagnosis or treatment significantly limited by social determinants of health: no pcp   Reevaluation: After the interventions noted above, I reevaluated the patient and found that they have improved  Co morbidities that complicate the patient evaluation  Past Medical History:  Diagnosis Date   Asthma    Heart murmur    HIV (human immunodeficiency virus infection) (HCC) 01/23/2019   Situs inversus    Syphilis 01/23/2019      Dispostion: Disposition decision including need for hospitalization was considered, and patient discharged from emergency department.    Final Clinical Impression(s) / ED Diagnoses Final diagnoses:  Pain, dental     This chart was dictated using voice recognition software.  Despite best efforts to proofread,  errors can occur which can change the documentation meaning.    Tanda Rockers A, DO 04/21/23 1026

## 2023-04-21 NOTE — ED Notes (Signed)
Discharge instructions, follow up care, prescriptions and pain management reviewed and explained, pt verbalized understanding and had no further questions on d/c. Pt caox4, ambulatory, NAD on d/c.

## 2023-04-21 NOTE — ED Triage Notes (Signed)
Pt arrives to ED with c/o dental pain.  

## 2023-05-04 NOTE — Addendum Note (Signed)
Encounter addended by: Drue Flirt, RN on: 05/04/2023 9:25 AM  Actions taken: Letter saved

## 2023-08-10 NOTE — Telephone Encounter (Signed)
Alonna Buckler to help him schedule an appointment, his phone number is not in service.   Sandie Ano, RN

## 2023-09-13 ENCOUNTER — Telehealth: Payer: 59 | Admitting: Physician Assistant

## 2023-09-13 DIAGNOSIS — J208 Acute bronchitis due to other specified organisms: Secondary | ICD-10-CM

## 2023-09-13 DIAGNOSIS — B9689 Other specified bacterial agents as the cause of diseases classified elsewhere: Secondary | ICD-10-CM

## 2023-09-13 MED ORDER — DOXYCYCLINE HYCLATE 100 MG PO TABS: 100.0000 mg | ORAL_TABLET | Freq: Two times a day (BID) | ORAL | 0 refills | Status: DC

## 2023-09-13 MED ORDER — BENZONATATE 100 MG PO CAPS: 100.0000 mg | ORAL_CAPSULE | Freq: Three times a day (TID) | ORAL | 0 refills | Status: DC | PRN

## 2023-09-13 NOTE — Patient Instructions (Signed)
Kelly Knight, thank you for joining [name], PA-C for today's virtual visit.  While this provider is not your primary care provider (PCP), if your PCP is located in our provider database this encounter information will be shared with them immediately following your visit.   A [facility] MyChart account gives you access to today's visit and all your visits, tests, and labs performed at [facility] " click here if you don't have a [facility] MyChart account or go to [email or web address]  Consent: (Patient) Kelly Knight provided verbal consent for this virtual visit at the beginning of the encounter.  Current Medications:  Current Outpatient Medications:    acetaminophen (TYLENOL) 325 MG tablet, Take 2 tablets (650 mg total) by mouth every 6 (six) hours as needed., Disp: 100 tablet, Rfl: 0   albuterol (PROVENTIL) (2.5 MG/3ML) 0.083% nebulizer solution, Take 2.5 mg by nebulization every 6 (six) hours as needed for wheezing., Disp: , Rfl:    albuterol (VENTOLIN HFA) 108 (90 Base) MCG/ACT inhaler, Inhale 2 puffs into the lungs every 6 (six) hours as needed for wheezing or shortness of breath., Disp: 8 g, Rfl: 1   amoxicillin-clavulanate (AUGMENTIN) 875-125 MG tablet, Take 1 tablet by mouth every 12 (twelve) hours., Disp: 14 tablet, Rfl: 0   aspirin-acetaminophen-caffeine (EXCEDRIN MIGRAINE) 250-250-65 MG tablet, Take 2 tablets by mouth 3 (three) times daily as needed for headache or migraine., Disp: , Rfl:    dextromethorphan (DELSYM) 30 MG/5ML liquid, Take 5 mLs (30 mg total) by mouth 2 (two) times daily., Disp: 89 mL, Rfl: 0   dolutegravir (TIVICAY) 50 MG tablet, Take 1 tablet (50 mg total) by mouth daily after supper., Disp: 30 tablet, Rfl: 11   emtricitabine-tenofovir AF (DESCOVY) 200-25 MG tablet, Take 1 tablet by mouth daily after supper., Disp: 30 tablet, Rfl: 11   hydrOXYzine (ATARAX) 25 MG tablet, Take 1 tablet (25 mg total) by mouth 3 (three) times  daily as needed for anxiety., Disp: 90 tablet, Rfl: 1   lidocaine (XYLOCAINE) 2 % solution, Use as directed 15 mLs in the mouth or throat as needed for mouth pain (swish and spit)., Disp: 100 mL, Rfl: 0   mirtazapine (REMERON) 15 MG tablet, Take 1 tablet (15 mg total) by mouth at bedtime., Disp: 30 tablet, Rfl: 1   omeprazole (PRILOSEC) 40 MG capsule, Take 1 capsule (40 mg total) by mouth 2 (two) times daily. 30 min before breakfast and dinner, Disp: 60 capsule, Rfl: 1   oxyCODONE (ROXICODONE) 5 MG immediate release tablet, Take 1 tablet (5 mg total) by mouth every 6 (six) hours as needed for severe pain., Disp: 5 tablet, Rfl: 0   promethazine-dextromethorphan (PROMETHAZINE-DM) 6.25-15 MG/5ML syrup, Take 5 mLs by mouth 4 (four) times daily as needed., Disp: 118 mL, Rfl: 0   Medications ordered in this encounter:  No orders of the defined types were placed in this encounter.    *If you need refills on other medications prior to your next appointment, please contact your pharmacy*  Follow-Up: Call back or seek an in-person evaluation if the symptoms worsen or if the condition fails to improve as anticipated.   Virtual Care [phone]  Other Instructions Take antibiotic (Doxycycline) as directed.  Increase fluids.  Get plenty of rest. Use Mucinex for congestion. Use the Tessalon as directed for cough. Keep up with use of your albuterol inhaler. Take a daily probiotic (I recommend Align or Culturelle, but even Activia Yogurt may be beneficial).  A  humidifier placed in the bedroom may offer some relief for a dry, scratchy throat of nasal irritation.  Read information below on acute bronchitis. Please call or return to clinic if symptoms are not improving.  Acute Bronchitis Bronchitis is when the airways that extend from the windpipe into the lungs get red, puffy, and painful (inflamed). Bronchitis often causes thick spit (mucus) to develop. This leads to a cough. A cough is the most  common symptom of bronchitis. In acute bronchitis, the condition usually begins suddenly and goes away over time (usually in 2 weeks). Smoking, allergies, and asthma can make bronchitis worse. Repeated episodes of bronchitis may cause more lung problems.  HOME CARE Rest. Drink enough fluids to keep your pee (urine) clear or pale yellow (unless you need to limit fluids as told by your doctor). Only take over-the-counter or prescription medicines as told by your doctor. Avoid smoking and secondhand smoke. These can make bronchitis worse. If you are a smoker, think about using nicotine gum or skin patches. Quitting smoking will help your lungs heal faster. Reduce the chance of getting bronchitis again by: Washing your hands often. Avoiding people with cold symptoms. Trying not to touch your hands to your mouth, nose, or eyes. Follow up with your doctor as told.  GET HELP IF: Your symptoms do not improve after 1 week of treatment. Symptoms include: Cough. Fever. Coughing up thick spit. Body aches. Chest congestion. Chills. Shortness of breath. Sore throat.  GET HELP RIGHT AWAY IF:  You have an increased fever. You have chills. You have severe shortness of breath. You have bloody thick spit (sputum). You throw up (vomit) often. You lose too much body fluid (dehydration). You have a severe headache. You faint.  MAKE SURE YOU:  Understand these instructions. Will watch your condition. Will get help right away if you are not doing well or get worse. Document Released: 03/14/2008 Document Revised: 05/29/2013 Document Reviewed: 03/19/2013 [facility] Patient Information 2015 [place], [place]. This information is not intended to replace advice given to you by your health care provider. Make sure you discuss any questions you have with your health care provider.    If you have been instructed to have an in-person evaluation today at a local Urgent Care facility, please use the link below.  It will take you to a list of all of our available [facility], including address, phone number and hours of operation. Please do not delay care.  [facility]  If you or a family member do not have a primary care provider, use the link below to schedule a visit and establish care. When you choose a [facility] primary care physician or advanced practice provider, you gain a long-term partner in health. Find a Primary Care Provider  Learn more about 's in-office and virtual care options:  - Get Care Now

## 2023-09-13 NOTE — Progress Notes (Signed)
Virtual Visit Consent   Kelly Knight, you are scheduled for a virtual visit with a [facility] provider today. Just as with appointments in the office, your consent must be obtained to participate. Your consent will be active for this visit and any virtual visit you may have with one of our providers in the next 365 days. If you have a MyChart account, a copy of this consent can be sent to you electronically.  As this is a virtual visit, video technology does not allow for your provider to perform a traditional examination. This may limit your provider's ability to fully assess your condition. If your provider identifies any concerns that need to be evaluated in person or the need to arrange testing (such as labs, EKG, etc.), we will make arrangements to do so. Although advances in technology are sophisticated, we cannot ensure that it will always work on either your end or our end. If the connection with a video visit is poor, the visit may have to be switched to a telephone visit. With either a video or telephone visit, we are not always able to ensure that we have a secure connection.  By engaging in this virtual visit, you consent to the provision of healthcare and authorize for your insurance to be billed (if applicable) for the services provided during this visit. Depending on your insurance coverage, you may receive a charge related to this service.  I need to obtain your verbal consent now. Are you willing to proceed with your visit today? Kelly Knight has provided verbal consent on 28/01/2023 for a virtual visit (video or telephone). Kelly Knight, [place]  Date: 09/13/2023 5:45 PM  Virtual Visit via Video Note   I, Kelly Knight, connected with  Kelly Knight  ([id number], 1996/09/25) on 09/13/23 at  5:45 PM EST by a video-enabled telemedicine application and verified that I am speaking with the correct person using two identifiers.  Location: Patient: Virtual Visit  Location Patient: Home Provider: Virtual Visit Location Provider: Home Office   I discussed the limitations of evaluation and management by telemedicine and the availability of in person appointments. The patient expressed understanding and agreed to proceed.    History of Present Illness: Kelly Knight is a 27 y.o. who identifies as a male who was assigned male at birth, and is being seen today for > 1 week of URI symptoms. Notes started with cough, congestion and low-grade fever with fever breaking the next day. Continued with some of the URI symptoms that progressed starting Monday with increased congestion again, chest tightness, wheezing and change in sputum production to dark green. Today having reoccurrence of fever.   OTC -- Tylenol, cough drops.   HPI: HPI  Problems:  Patient Active Problem List   Diagnosis Date Noted   Adjustment disorder with mixed anxiety and depressed mood 08/16/2022   Uncomplicated asthma 10/29/2021   Situs inversus abdominalis 05/17/2021   GERD with esophagitis 01/21/2021   Environmental allergies 07/05/2020   Anxiety and depression 05/10/2019   HIV (human immunodeficiency virus infection) (HCC) 01/23/2019   History of syphilis 01/23/2019    Allergies:  Allergies  Allergen Reactions   Shrimp [Shellfish Allergy] Itching and Swelling    Throat swelling   Sulfa Antibiotics Rash   Medications:  Current Outpatient Medications:    benzonatate (TESSALON) 100 MG capsule, Take 1 capsule (100 mg total) by mouth 3 (three) times daily as needed for cough., Disp: 30 capsule, Rfl: 0  doxycycline (VIBRA-TABS) 100 MG tablet, Take 1 tablet (100 mg total) by mouth 2 (two) times daily., Disp: 14 tablet, Rfl: 0   acetaminophen (TYLENOL) 325 MG tablet, Take 2 tablets (650 mg total) by mouth every 6 (six) hours as needed., Disp: 100 tablet, Rfl: 0   albuterol (PROVENTIL) (2.5 MG/3ML) 0.083% nebulizer solution, Take 2.5 mg by nebulization every 6 (six) hours as  needed for wheezing., Disp: , Rfl:    albuterol (VENTOLIN HFA) 108 (90 Base) MCG/ACT inhaler, Inhale 2 puffs into the lungs every 6 (six) hours as needed for wheezing or shortness of breath., Disp: 8 g, Rfl: 1   dolutegravir (TIVICAY) 50 MG tablet, Take 1 tablet (50 mg total) by mouth daily after supper., Disp: 30 tablet, Rfl: 11   emtricitabine-tenofovir AF (DESCOVY) 200-25 MG tablet, Take 1 tablet by mouth daily after supper., Disp: 30 tablet, Rfl: 11   hydrOXYzine (ATARAX) 25 MG tablet, Take 1 tablet (25 mg total) by mouth 3 (three) times daily as needed for anxiety., Disp: 90 tablet, Rfl: 1   mirtazapine (REMERON) 15 MG tablet, Take 1 tablet (15 mg total) by mouth at bedtime., Disp: 30 tablet, Rfl: 1   omeprazole (PRILOSEC) 40 MG capsule, Take 1 capsule (40 mg total) by mouth 2 (two) times daily. 30 min before breakfast and dinner, Disp: 60 capsule, Rfl: 1  Observations/Objective: Patient is well-developed, well-nourished in no acute distress.  Resting comfortably at home.  Head is normocephalic, atraumatic.  No labored breathing. Speech is clear and coherent with logical content.  Patient is alert and oriented at baseline.   Assessment and Plan: 1. Acute bacterial bronchitis - benzonatate (TESSALON) 100 MG capsule; Take 1 capsule (100 mg total) by mouth 3 (three) times daily as needed for cough.  Dispense: 30 capsule; Refill: 0 - doxycycline (VIBRA-TABS) 100 MG tablet; Take 1 tablet (100 mg total) by mouth 2 (two) times daily.  Dispense: 14 tablet; Refill: 0  Rx Doxycycline.  Increase fluids.  Rest.  Saline nasal spray.  Probiotic.  Mucinex as directed.  Humidifier in bedroom. Tessalon as directed. Continue albuterol as directed.  Call or return to clinic if symptoms are not improving.   Follow Up Instructions: I discussed the assessment and treatment plan with the patient. The patient was provided an opportunity to ask questions and all were answered. The patient agreed with the plan  and demonstrated an understanding of the instructions.  A copy of instructions were sent to the patient via MyChart unless otherwise noted below.   The patient was advised to call back or seek an in-person evaluation if the symptoms worsen or if the condition fails to improve as anticipated.    Kelly Climes, PA-C

## 2023-09-15 ENCOUNTER — Encounter: Payer: Self-pay | Admitting: Emergency Medicine

## 2023-09-15 ENCOUNTER — Ambulatory Visit
Admission: EM | Admit: 2023-09-15 | Discharge: 2023-09-15 | Disposition: A | Discharge status: Home / Self Care | Payer: Medicaid Other | Attending: Nurse Practitioner | Admitting: Nurse Practitioner

## 2023-09-15 ENCOUNTER — Other Ambulatory Visit: Payer: Self-pay

## 2023-09-15 ENCOUNTER — Ambulatory Visit: Payer: Medicaid Other

## 2023-09-15 DIAGNOSIS — J189 Pneumonia, unspecified organism: Secondary | ICD-10-CM

## 2023-09-15 MED ORDER — PROMETHAZINE-DM 6.25-15 MG/5ML PO SYRP: 5.0000 mL | ORAL_SOLUTION | Freq: Four times a day (QID) | ORAL | 0 refills | Status: DC | PRN

## 2023-09-15 MED ORDER — ACETAMINOPHEN 160 MG/5ML PO SOLN
1000.0000 mg | Freq: Once | ORAL | Status: AC
  Administered 2023-09-15: 1000 mg via ORAL

## 2023-09-15 MED ORDER — AMOXICILLIN-POT CLAVULANATE 400-57 MG/5ML PO SUSR: 875.0000 mg | Freq: Two times a day (BID) | ORAL | 0 refills | Status: AC

## 2023-09-15 MED ORDER — IBUPROFEN 100 MG/5ML PO SUSP
800.0000 mg | Freq: Once | ORAL | Status: AC
  Administered 2023-09-15: 800 mg via ORAL

## 2023-09-15 MED ORDER — DOXYCYCLINE MONOHYDRATE 25 MG/5ML PO SUSR: 100.0000 mg | Freq: Two times a day (BID) | ORAL | 0 refills | Status: AC

## 2023-09-15 NOTE — ED Provider Notes (Signed)
[facility]    CSN: [id number] Arrival date & time: 09/15/23  1255      History   Chief Complaint Chief Complaint  Patient presents with   Cough    HPI Kelly Knight is a 27 y.o. male.   Patient presents today with 6-day history of cough, fever, headache, and decreased appetite.  He reports over the past 24 hours, he has also developed shortness of breath and pain in the left side of his lower chest when he takes in a deep breath.  No chest pain at rest, wheezing, runny or stuffy nose.  No abdominal pain, nausea/vomiting, or diarrhea.  Has been able to drink plenty of water today, but does not have much of an appetite.  Patient reports he did a televisit on 09/13/2023, was prescribed doxycycline and Tessalon Perles.  He has a chronic difficulty of swallowing pills and has been chewing the doxycycline before swallowing it.  He is requesting liquid medicine today.  Medical history significant for HIV, reports he is taking Tivicay and Descovy as prescribed and has follow-up scheduled with infectious disease.  I encouraged him to reach out and make sure he has an appointment scheduled for follow up as indicated.     Past Medical History:  Diagnosis Date   Asthma    Heart murmur    HIV (human immunodeficiency virus infection) (HCC) 01/23/2019   Situs inversus    Syphilis 01/23/2019    Patient Active Problem List   Diagnosis Date Noted   Adjustment disorder with mixed anxiety and depressed mood 08/16/2022   Uncomplicated asthma 10/29/2021   Situs inversus abdominalis 05/17/2021   GERD with esophagitis 01/21/2021   Environmental allergies 07/05/2020   Anxiety and depression 05/10/2019   HIV (human immunodeficiency virus infection) (HCC) 01/23/2019   History of syphilis 01/23/2019    History reviewed. No pertinent surgical history.     Home Medications    Prior to Admission medications   Medication Sig Start Date End Date Taking? Authorizing Provider   amoxicillin-clavulanate (AUGMENTIN) 400-57 MG/5ML suspension Take 10.9 mLs (875 mg total) by mouth 2 (two) times daily for 7 days. 09/15/23 09/22/23 Yes Valentino Nose, NP  doxycycline (VIBRAMYCIN) 25 MG/5ML SUSR Take 20 mLs (100 mg total) by mouth 2 (two) times daily for 7 days. 09/15/23 09/22/23 Yes Valentino Nose, NP  promethazine-dextromethorphan (PROMETHAZINE-DM) 6.25-15 MG/5ML syrup Take 5 mLs by mouth 4 (four) times daily as needed for cough. Do not take with alcohol or while driving or operating heavy machinery.  May cause drowsiness. 09/15/23  Yes Valentino Nose, NP  acetaminophen (TYLENOL) 325 MG tablet Take 2 tablets (650 mg total) by mouth every 6 (six) hours as needed. 04/21/23   Tanda Rockers A, DO  albuterol (PROVENTIL) (2.5 MG/3ML) 0.083% nebulizer solution Take 2.5 mg by nebulization every 6 (six) hours as needed for wheezing.    [provider]  albuterol (VENTOLIN HFA) 108 (90 Base) MCG/ACT inhaler Inhale 2 puffs into the lungs every 6 (six) hours as needed for wheezing or shortness of breath. 02/07/23   Rising, Lurena Joiner, PA-C  benzonatate (TESSALON) 100 MG capsule Take 1 capsule (100 mg total) by mouth 3 (three) times daily as needed for cough. 09/13/23   Waldon Merl, PA-C  dolutegravir (TIVICAY) 50 MG tablet Take 1 tablet (50 mg total) by mouth daily after supper. 10/27/22   Blanchard Kelch, NP  emtricitabine-tenofovir AF (DESCOVY) 200-25 MG tablet Take 1 tablet by mouth daily after  supper. 10/27/22   Blanchard Kelch, NP  hydrOXYzine (ATARAX) 25 MG tablet Take 1 tablet (25 mg total) by mouth 3 (three) times daily as needed for anxiety. 01/19/23   Karsten Ro, MD  mirtazapine (REMERON) 15 MG tablet Take 1 tablet (15 mg total) by mouth at bedtime. 01/19/23   Karsten Ro, MD  omeprazole (PRILOSEC) 40 MG capsule Take 1 capsule (40 mg total) by mouth 2 (two) times daily. 30 min before breakfast and dinner 10/27/22   Blanchard Kelch, NP    Family  History Family History  Problem Relation Age of Onset   Diabetes Mother    Hypertension Mother    Hypertension Father     Social History Social History   Tobacco Use   Smoking status: Former    Types: Cigarettes    Passive exposure: Never   Smokeless tobacco: Never   Tobacco comments:    Stopped vaping   Vaping Use   Vaping status: Former   Substances: THC  Substance Use Topics   Alcohol use: Not Currently    Comment: socially    Drug use: Not Currently    Frequency: 7.0 times per week    Types: Marijuana     Allergies   Shrimp [shellfish allergy] and Sulfa antibiotics   Review of Systems Review of Systems Per HPI  Physical Exam Triage Vital Signs ED Triage Vitals  Encounter Vitals Group     BP 09/15/23 1307 115/62     Systolic BP Percentile --      Diastolic BP Percentile --      Pulse Rate 09/15/23 1307 95     Resp 09/15/23 1307 (!) 21     Temp 09/15/23 1307 (!) 102.8 F (39.3 C)     Temp Source 09/15/23 1307 Oral     SpO2 09/15/23 1307 95 %     Weight 09/15/23 1309 129 lb (58.5 kg)     Height --      Head Circumference --      Peak Flow --      Pain Score 09/15/23 1319 5     Pain Loc --      Pain Education --      Exclude from Growth Chart --    No data found.  Updated Vital Signs BP 115/62 (BP Location: Right Arm)   Pulse 95   Temp (!) 102.8 F (39.3 C) (Oral)   Resp (!) 21   Wt 129 lb (58.5 kg)   SpO2 95%   BMI 19.05 kg/m   Visual Acuity Right Eye Distance:   Left Eye Distance:   Bilateral Distance:    Right Eye Near:   Left Eye Near:    Bilateral Near:     Physical Exam Vitals and nursing note reviewed.  Constitutional:      General: He is not in acute distress.    Appearance: Normal appearance. He is not ill-appearing or toxic-appearing.  HENT:     Head: Normocephalic and atraumatic.     Right Ear: Tympanic membrane, ear canal and external ear normal.     Left Ear: Tympanic membrane, ear canal and external ear normal.      Nose: No congestion or rhinorrhea.     Mouth/Throat:     Mouth: Mucous membranes are moist.     Pharynx: Oropharynx is clear. Posterior oropharyngeal erythema present. No oropharyngeal exudate.  Eyes:     General: No scleral icterus.    Extraocular Movements: Extraocular movements intact.  Cardiovascular:     Rate and Rhythm: Normal rate and regular rhythm.  Pulmonary:     Effort: Pulmonary effort is normal. No respiratory distress.     Breath sounds: Examination of the left-lower field reveals rhonchi. Rhonchi present. No wheezing or rales.  Musculoskeletal:     Cervical back: Normal range of motion and neck supple.  Lymphadenopathy:     Cervical: No cervical adenopathy.  Skin:    General: Skin is warm and dry.     Coloration: Skin is not jaundiced or pale.     Findings: No erythema or rash.  Neurological:     Mental Status: He is alert and oriented to person, place, and time.  Psychiatric:        Behavior: Behavior is cooperative.      UC Treatments / Results  Labs (all labs ordered are listed, but only abnormal results are displayed) Labs Reviewed - No data to display  EKG   Radiology No results found.  Procedures Procedures (including critical care time)  Medications Ordered in UC Medications  acetaminophen (TYLENOL) 160 MG/5ML solution 1,000 mg (1,000 mg Oral Given 09/15/23 1316)  ibuprofen (ADVIL) 100 MG/5ML suspension 800 mg (800 mg Oral Given 09/15/23 1405)    Initial Impression / Assessment and Plan / UC Course  I have reviewed the triage vital signs and the nursing notes.  Pertinent labs & imaging results that were available during my care of the patient were reviewed by me and considered in my medical decision making (see chart for details).   In triage today, patient is ill-appearing.  He is febrile and slightly tachypneic.  He is normotensive, not tachycardic, SpO2 is 95% room air.  1. Pneumonia of left lower lobe due to infectious  organism Continue doxycycline twice daily for 7 days, start Augmentin twice daily for 7 days-liquid sent to pharmacy for better adherence Start cough suppressant medication as needed Recommended follow-up PCP in approximately 6 weeks for repeat chest x-ray to ensure full resolution Strict ER precautions discussed in the meantime if symptoms worsen Work excuse provided  The patient was given the opportunity to ask questions.  All questions answered to their satisfaction.  The patient is in agreement to this plan.   Final Clinical Impressions(s) / UC Diagnoses   Final diagnoses:  Pneumonia of left lower lobe due to infectious organism     Discharge Instructions      I am concerned you have pneumonia of your left lower lung.  Take the doxycycline and Augmentin as prescribed to treat it.  You can use the cough syrup every 6 hours as needed, be aware that this may make you a little bit drowsy so do not take while driving or operating heavy machinery.  You can take Tylenol or ibuprofen as needed for fever.  Recommend follow-up in approximately 6 weeks for chest x-ray repeat to ensure the opacity is fully resolved.  If symptoms worsen in the meantime, seek care in the ER.  Be sure to make up to follow-up with infectious disease as planned.   ED Prescriptions     Medication Sig Dispense Auth. Provider   doxycycline (VIBRAMYCIN) 25 MG/5ML SUSR Take 20 mLs (100 mg total) by mouth 2 (two) times daily for 7 days. 240 mL Cathlean Marseilles A, NP   amoxicillin-clavulanate (AUGMENTIN) 400-57 MG/5ML suspension Take 10.9 mLs (875 mg total) by mouth 2 (two) times daily for 7 days. 152.6 mL Valentino Nose, NP   promethazine-dextromethorphan (PROMETHAZINE-DM) 6.25-15 MG/5ML  syrup Take 5 mLs by mouth 4 (four) times daily as needed for cough. Do not take with alcohol or while driving or operating heavy machinery.  May cause drowsiness. 118 mL Valentino Nose, NP      PDMP not reviewed this  encounter.   Valentino Nose, NP 09/15/23 1427

## 2023-09-15 NOTE — Discharge Instructions (Addendum)
I am concerned you have pneumonia of your left lower lung.  Take the doxycycline and Augmentin as prescribed to treat it.  You can use the cough syrup every 6 hours as needed, be aware that this may make you a little bit drowsy so do not take while driving or operating heavy machinery.  You can take Tylenol or ibuprofen as needed for fever.  Recommend follow-up in approximately 6 weeks for chest x-ray repeat to ensure the opacity is fully resolved.  If symptoms worsen in the meantime, seek care in the ER.  Be sure to make up to follow-up with infectious disease as planned.

## 2023-09-15 NOTE — ED Triage Notes (Signed)
Pt reports is being treated for bronchitis and reports was feeling better but reports was outside on wednesday for several hours and reports left lower rib pain, fever, chills has returned. Last dose of tylenol Wednesday prior to UC dose. Reports has been on abx since Monday. Missed a dose on Wednesday.

## 2023-09-16 ENCOUNTER — Ambulatory Visit: Payer: 59

## 2023-10-06 ENCOUNTER — Telehealth: Payer: Self-pay

## 2023-10-06 NOTE — Telephone Encounter (Signed)
Patient called office requesting assistance completing form for work and for xray.  Was seen at Urgent care on 12/6 after having fever 100.5, sharp chest pain, cough, congestion. Was diagnosed with Pneumonia and prescribed antibiotics. Urgent care told patient that he would need follow up xray in six weeks to see how lungs are looking.  Patient needs form completed to cover days missed. Missed 12/9,10,12,13,16,17,19,20,23,26. Is scheduled for follow up on 10/16/23 with provider. Will email form to staff. Will call office if employer is not able to wait until appt for this. Juanita Laster, RMA

## 2023-10-09 NOTE — Telephone Encounter (Signed)
Patient aware he will need to be evaluated before letter can be written. Patient will keep scheduled appointment on 10/16/23. Advised him he could go to urgent care to be cleared if he doesn't want to wait a week to return to work.   Cheray Pardi Lesli Albee, CMA

## 2023-10-09 NOTE — Telephone Encounter (Signed)
Patient left a voicemail on triage nurse line stating that his employer would accept a letter from provider to excuse time off of work and that he is able to return to work today.    Perle Gibbon Lesli Albee, CMA

## 2023-10-16 ENCOUNTER — Ambulatory Visit: Payer: 59 | Admitting: Infectious Diseases

## 2024-02-14 ENCOUNTER — Telehealth: Payer: Self-pay

## 2024-02-14 NOTE — Telephone Encounter (Signed)
 Patient considered out of care.  Last [facility] Visit: 12-20-22  Last HIV Viral Load:  HIV 1 RNA Quant  Date Value Ref Range Status  12/20/2022 84,500 (H) Copies/mL Final    Last CD4 Count:  CD4 T Cell Abs  Date Value Ref Range Status  10/27/2022 267 (L) 400 - 1,790 /uL Final    Medication Dispense History:   Dispensed Days Supply Quantity Provider Pharmacy  TIVICAY  50MG  TABLETS 12/13/2022 30 30 each Orson Blalock, NP [facility]...  TIVICAY  50MG  TABLETS 05/06/2022 30 30 each Orson Blalock, NP [facility] #...  TIVICAY  50MG  TABLETS 12/29/2021 30 30 each Orson Blalock, NP [facility]    Dispensed Days Supply Quantity Provider Pharmacy  DESCOVY  200MG /25MG  TABLETS 12/13/2022 30 30 each Orson Blalock, NP [facility] #...  DESCOVY  200MG /25MG  TABLETS 05/05/2022 30 30 each Orson Blalock, NP [facility] #...  DESCOVY  200MG /25MG  TABLETS 12/29/2021 30 30 each Orson Blalock, NP [facility]    Interventions: Drusilla Gerlach to schedule appointment, no answer. Left HIPAA compliant voicemail requesting callback. Will try reaching out via MyChart as well.   Duration of Services: 10 minutes  Riley Papin, BSN, Charity fundraiser

## 2024-02-28 ENCOUNTER — Other Ambulatory Visit (HOSPITAL_COMMUNITY): Payer: Self-pay

## 2024-03-13 ENCOUNTER — Telehealth: Payer: Self-pay

## 2024-03-13 NOTE — Telephone Encounter (Signed)
 Called Kym to schedule appointment, call could not be completed.   Darragh Nay, BSN, RN

## 2024-04-17 ENCOUNTER — Telehealth: Payer: Self-pay

## 2024-04-17 NOTE — Telephone Encounter (Signed)
 Called patient to schedule appointment, call could not be completed.   Victorian Gunn, BSN, RN

## 2024-05-09 ENCOUNTER — Other Ambulatory Visit (HOSPITAL_COMMUNITY): Payer: Self-pay

## 2024-05-09 MED ORDER — DOXYCYCLINE HYCLATE 100 MG PO TABS: 100.0000 mg | ORAL_TABLET | Freq: Two times a day (BID) | ORAL | 0 refills | Status: DC

## 2024-05-09 MED ORDER — BENZONATATE 100 MG PO CAPS: 100.0000 mg | ORAL_CAPSULE | Freq: Three times a day (TID) | ORAL | 0 refills | Status: DC | PRN

## 2024-05-09 MED ORDER — AMOXICILLIN-POT CLAVULANATE 400-57 MG/5ML PO SUSR: 872.0000 mg | Freq: Two times a day (BID) | ORAL | 0 refills | Status: DC

## 2024-05-10 ENCOUNTER — Other Ambulatory Visit (HOSPITAL_COMMUNITY): Payer: Self-pay

## 2024-05-10 ENCOUNTER — Other Ambulatory Visit: Payer: Self-pay | Admitting: Infectious Diseases

## 2024-05-10 ENCOUNTER — Encounter (HOSPITAL_COMMUNITY): Payer: Self-pay

## 2024-05-10 DIAGNOSIS — Z21 Asymptomatic human immunodeficiency virus [HIV] infection status: Secondary | ICD-10-CM

## 2024-05-10 NOTE — Telephone Encounter (Signed)
 Kelly Knight has not picked up any of his refills that were sent one year ago. Staff have been unable to get in touch with him.   Amorah Sebring, BSN, RN

## 2024-05-13 ENCOUNTER — Other Ambulatory Visit (HOSPITAL_COMMUNITY): Payer: Self-pay

## 2024-05-21 ENCOUNTER — Other Ambulatory Visit (HOSPITAL_COMMUNITY): Payer: Self-pay

## 2024-05-28 ENCOUNTER — Telehealth: Admitting: Nurse Practitioner

## 2024-05-28 DIAGNOSIS — R110 Nausea: Secondary | ICD-10-CM | POA: Diagnosis not present

## 2024-05-28 DIAGNOSIS — K2100 Gastro-esophageal reflux disease with esophagitis, without bleeding: Secondary | ICD-10-CM | POA: Diagnosis not present

## 2024-05-28 MED ORDER — FAMOTIDINE 20 MG PO TABS: ORAL_TABLET | ORAL | 2 refills | Status: DC

## 2024-05-28 MED ORDER — ONDANSETRON 4 MG PO TBDP
4.0000 mg | ORAL_TABLET | Freq: Three times a day (TID) | ORAL | 0 refills | Status: DC | PRN
Start: 2024-05-28 — End: 2024-05-30

## 2024-05-28 MED ORDER — OMEPRAZOLE 40 MG PO CPDR: 40.0000 mg | DELAYED_RELEASE_CAPSULE | Freq: Every day | ORAL | 1 refills | Status: DC

## 2024-05-28 NOTE — Progress Notes (Signed)
 Virtual Visit Consent   Kelly Knight, you are scheduled for a virtual visit with a [facility] provider today. Just as with appointments in the office, your consent must be obtained to participate. Your consent will be active for this visit and any virtual visit you may have with one of our providers in the next 365 days. If you have a MyChart account, a copy of this consent can be sent to you electronically.  As this is a virtual visit, video technology does not allow for your provider to perform a traditional examination. This may limit your provider's ability to fully assess your condition. If your provider identifies any concerns that need to be evaluated in person or the need to arrange testing (such as labs, EKG, etc.), we will make arrangements to do so. Although advances in technology are sophisticated, we cannot ensure that it will always work on either your end or our end. If the connection with a video visit is poor, the visit may have to be switched to a telephone visit. With either a video or telephone visit, we are not always able to ensure that we have a secure connection.  By engaging in this virtual visit, you consent to the provision of healthcare and authorize for your insurance to be billed (if applicable) for the services provided during this visit. Depending on your insurance coverage, you may receive a charge related to this service.  I need to obtain your verbal consent now. Are you willing to proceed with your visit today? Kelly Knight has provided verbal consent on 05/28/2024 for a virtual visit (video or telephone). Lauraine Kitty, FNP  Date: 05/28/2024 4:36 PM   Virtual Visit via Video Note   I, Lauraine Kitty, connected with  Kelly Knight  ([id number], 1996/08/03) on 05/28/24 at  4:45 PM EDT by a video-enabled telemedicine application and verified that I am speaking with the correct person using two identifiers.  Location: Patient: Virtual Visit Location  Patient: Home Provider: Virtual Visit Location Provider: Home Office   I discussed the limitations of evaluation and management by telemedicine and the availability of in person appointments. The patient expressed understanding and agreed to proceed.    History of Present Illness: Kelly Knight is a 28 y.o. who identifies as a male who was assigned male at birth, and is being seen today for concern over worsening GERD and loss of appetite.   Over the past week he has felt like his esophagus is small he feels the acid in his mouth and the metal taste in his mouth.   He is out of Zofran  that he typically uses  He has been out of all of his medicine for the past year.   He [place]  History also  significant for HIV, he had pneumonia this past winter.  Also has asthma/anxiety and depression   Denies emesis  Needs PCP and to re establish with ID Problems:  Patient Active Problem List   Diagnosis Date Noted   Adjustment disorder with mixed anxiety and depressed mood 08/16/2022   Uncomplicated asthma 10/29/2021   Situs inversus abdominalis 05/17/2021   GERD with esophagitis 01/21/2021   Environmental allergies 07/05/2020   Anxiety and depression 05/10/2019   HIV (human immunodeficiency virus infection) (HCC) 01/23/2019   History of syphilis 01/23/2019    Allergies:  Allergies  Allergen Reactions   Shrimp [Shellfish Allergy] Itching and Swelling  Throat swelling   Sulfa Antibiotics Rash   Medications:  Current Outpatient Medications:    acetaminophen  (TYLENOL ) 325 MG tablet, Take 2 tablets (650 mg total) by mouth every 6 (six) hours as needed., Disp: 100 tablet, Rfl: 0   albuterol  (PROVENTIL ) (2.5 MG/3ML) 0.083% nebulizer solution, Take 2.5 mg by nebulization every 6 (six) hours as needed for wheezing., Disp: , Rfl:    albuterol  (VENTOLIN  HFA) 108 (90 Base) MCG/ACT inhaler, Inhale 2 puffs into the lungs every 6 (six) hours as  needed for wheezing or shortness of breath., Disp: 8 g, Rfl: 1   amoxicillin -clavulanate (AUGMENTIN ) 400-57 MG/5ML suspension, Take 10.9 mLs (872 mg total) by mouth 2 (two) times daily for 7 days., Disp: 200 mL, Rfl: 0   benzonatate  (TESSALON ) 100 MG capsule, Take 1 capsule (100 mg total) by mouth 3 (three) times daily as needed for cough., Disp: 30 capsule, Rfl: 0   benzonatate  (TESSALON ) 100 MG capsule, Take 1 capsule (100 mg total) by mouth 3 (three) times daily as needed for cough., Disp: 30 capsule, Rfl: 0   dolutegravir  (TIVICAY ) 50 MG tablet, Take 1 tablet (50 mg total) by mouth daily after supper., Disp: 30 tablet, Rfl: 11   doxycycline  (VIBRA -TABS) 100 MG tablet, Take 1 tablet (100 mg total) by mouth 2 (two) times daily., Disp: 14 tablet, Rfl: 0   emtricitabine -tenofovir  AF (DESCOVY ) 200-25 MG tablet, Take 1 tablet by mouth daily after supper., Disp: 30 tablet, Rfl: 11   hydrOXYzine  (ATARAX ) 25 MG tablet, Take 1 tablet (25 mg total) by mouth 3 (three) times daily as needed for anxiety., Disp: 90 tablet, Rfl: 1   mirtazapine  (REMERON ) 15 MG tablet, Take 1 tablet (15 mg total) by mouth at bedtime., Disp: 30 tablet, Rfl: 1   omeprazole  (PRILOSEC) 40 MG capsule, Take 1 capsule (40 mg total) by mouth 2 (two) times daily. 30 min before breakfast and dinner, Disp: 60 capsule, Rfl: 1   promethazine -dextromethorphan  (PROMETHAZINE -DM) 6.25-15 MG/5ML syrup, Take 5 mLs by mouth 4 (four) times daily as needed for cough. Do not take with alcohol or while driving or operating heavy machinery.  May cause drowsiness., Disp: 118 mL, Rfl: 0  Observations/Objective: Patient is well-developed, well-nourished in no acute distress.  Resting comfortably  at home.  Head is normocephalic, atraumatic.  No labored breathing.  Speech is clear and coherent with logical content.  Patient is alert and oriented at baseline.    Assessment and Plan:   1. Gastroesophageal reflux disease with esophagitis, unspecified  whether hemorrhage (Primary)  - omeprazole  (PRILOSEC) 40 MG capsule; Take 1 capsule (40 mg total) by mouth daily. 30 min before breakfast and dinner  Dispense: 60 capsule; Refill: 1 - famotidine  (PEPCID ) 20 MG tablet; Use twice daily for one week then as needed for heartburn up to twice daily prn  Dispense: 60 tablet; Refill: 2  2. Nausea  - ondansetron  (ZOFRAN -ODT) 4 MG disintegrating tablet; Take 1 tablet (4 mg total) by mouth every 8 (eight) hours as needed.  Dispense: 20 tablet; Refill: 0  Follow Up Instructions: I discussed the assessment and treatment plan with the patient. The patient was provided an opportunity to ask questions and all were answered. The patient agreed with the plan and demonstrated an understanding of the instructions.  A copy of instructions were sent to the patient via MyChart unless otherwise noted below.    The patient was advised to call back or seek an in-person evaluation if the symptoms worsen or if the condition  fails to improve as anticipated.    Lauraine Kitty, FNP

## 2024-05-30 ENCOUNTER — Other Ambulatory Visit: Payer: Self-pay

## 2024-05-30 ENCOUNTER — Other Ambulatory Visit (HOSPITAL_COMMUNITY): Payer: Self-pay

## 2024-05-30 ENCOUNTER — Telehealth: Admitting: Nurse Practitioner

## 2024-05-30 ENCOUNTER — Encounter: Payer: Self-pay | Admitting: Nurse Practitioner

## 2024-05-30 DIAGNOSIS — F419 Anxiety disorder, unspecified: Secondary | ICD-10-CM

## 2024-05-30 DIAGNOSIS — F332 Major depressive disorder, recurrent severe without psychotic features: Secondary | ICD-10-CM | POA: Diagnosis not present

## 2024-05-30 DIAGNOSIS — K2100 Gastro-esophageal reflux disease with esophagitis, without bleeding: Secondary | ICD-10-CM | POA: Diagnosis not present

## 2024-05-30 DIAGNOSIS — Z599 Problem related to housing and economic circumstances, unspecified: Secondary | ICD-10-CM | POA: Diagnosis not present

## 2024-05-30 DIAGNOSIS — Z59819 Housing instability, housed unspecified: Secondary | ICD-10-CM

## 2024-05-30 DIAGNOSIS — R110 Nausea: Secondary | ICD-10-CM

## 2024-05-30 DIAGNOSIS — F32A Depression, unspecified: Secondary | ICD-10-CM

## 2024-05-30 DIAGNOSIS — F17200 Nicotine dependence, unspecified, uncomplicated: Secondary | ICD-10-CM

## 2024-05-30 DIAGNOSIS — Z21 Asymptomatic human immunodeficiency virus [HIV] infection status: Secondary | ICD-10-CM | POA: Diagnosis not present

## 2024-05-30 DIAGNOSIS — F5105 Insomnia due to other mental disorder: Secondary | ICD-10-CM

## 2024-05-30 DIAGNOSIS — F99 Mental disorder, not otherwise specified: Secondary | ICD-10-CM

## 2024-05-30 MED ORDER — NICOTINE POLACRILEX 4 MG MT GUM
4.0000 mg | CHEWING_GUM | OROMUCOSAL | 0 refills | Status: DC | PRN
  Filled 2024-05-30: qty 100, fill #0

## 2024-05-30 MED ORDER — NICOTINE 14 MG/24HR TD PT24
14.0000 mg | MEDICATED_PATCH | Freq: Every day | TRANSDERMAL | 1 refills | Status: DC
  Filled 2024-05-30 – 2024-06-12 (×2): qty 28, 28d supply, fill #0

## 2024-05-30 MED ORDER — FAMOTIDINE 20 MG PO TABS
ORAL_TABLET | ORAL | 2 refills | Status: AC
  Filled 2024-05-30: qty 60, 30d supply, fill #0

## 2024-05-30 MED ORDER — NICOTINE 21 MG/24HR TD PT24
21.0000 mg | MEDICATED_PATCH | Freq: Every day | TRANSDERMAL | 0 refills | Status: DC
  Filled 2024-05-30: qty 28, 28d supply, fill #0

## 2024-05-30 MED ORDER — NICOTINE POLACRILEX 4 MG MT GUM
4.0000 mg | CHEWING_GUM | OROMUCOSAL | 0 refills | Status: DC | PRN
  Filled 2024-05-30: qty 100, 30d supply, fill #0
  Filled 2024-06-12: qty 100, 20d supply, fill #0

## 2024-05-30 MED ORDER — HYDROXYZINE HCL 25 MG PO TABS
25.0000 mg | ORAL_TABLET | Freq: Three times a day (TID) | ORAL | 1 refills | Status: DC | PRN
  Filled 2024-05-30 – 2024-06-12 (×2): qty 90, 30d supply, fill #0

## 2024-05-30 MED ORDER — ONDANSETRON 4 MG PO TBDP
4.0000 mg | ORAL_TABLET | Freq: Three times a day (TID) | ORAL | 0 refills | Status: AC | PRN
  Filled 2024-05-30 – 2024-06-12 (×2): qty 20, 7d supply, fill #0

## 2024-05-30 MED ORDER — MIRTAZAPINE 15 MG PO TABS
15.0000 mg | ORAL_TABLET | Freq: Every day | ORAL | 1 refills | Status: DC
  Filled 2024-05-30 – 2024-06-12 (×2): qty 30, 30d supply, fill #0

## 2024-05-30 MED ORDER — OMEPRAZOLE 40 MG PO CPDR
DELAYED_RELEASE_CAPSULE | ORAL | 1 refills | Status: AC
  Filled 2024-05-30 – 2024-06-12 (×2): qty 60, 30d supply, fill #0
  Filled 2024-07-30: qty 60, 30d supply, fill #1

## 2024-05-30 NOTE — Progress Notes (Signed)
 Virtual Primary Care Telehealth Visit  Virtual Visit Consent  Kelly Knight, you are scheduled for a virtual visit with a [facility] provider today. Just as with appointments in the office, your consent must be obtained to participate. Your consent will be active for this visit and any virtual visit you may have with one of our providers in the next 365 days. If you have a MyChart account, a copy of this consent can be sent to you electronically.  By engaging in this virtual visit, you consent to the provision of healthcare and authorize for your insurance to be billed (if applicable) for the services provided during this visit. Depending on your insurance coverage, you may receive a charge related to this service.  I need to obtain your verbal consent now. Are you willing to proceed with your visit today? Kelly Knight has provided verbal consent on 05/30/2024 for a virtual visit. Lauraine Kitty, FNP  Date: 05/30/2024 9:29 AM  Virtual Visit via Video Note   I, Lauraine Kitty, connected with  Kelly Knight  ([id number], 24-Sep-1996) on 05/30/24 at  9:00 AM EDT by a video-enabled telemedicine application and verified that I am speaking with the correct person using two identifiers.  This is an initial visit to discuss the opportunity to become a primary care patient at [facility]  The patient understands that if their medical background is complex, their case will be reviewed with the Medical Director, and if Virtual Primary Care is not the ideal location for their care, our team will help establish the patient with a primary care provider in the area.   If this is determined that this location is not the best option for the patient, in the future if the patient's medical condition changes we can re explore the option of this location serving as their primary care location.  The patient understands that by becoming a primary care patient, this would be the location for their primary care,  and if they chose to leave this location and seek primary care services at another location they will not be able to continue their relationship with this clinic.    Location: Patient: Virtual Visit Location Patient: [facility] visit location: Home  Provider: Virtual Visit Location Provider: Home Office   History of Present Illness: Kelly Knight is a 28 y.o. who identifies as a male who was assigned male at birth, and is being seen today as a new patient with the [facility]. He has recently moved back to [place] after a year of being homeless. Was previously established with Infectious Disease for management of his HIV. He has been lost to follow up due to living out of state.   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 He does not have a PCP and states he has not had one since he was a teenager.   Significant Medical History is below  He was seen earlier this week in [facility] and referred to [facility]   Do you have a current Primary Care Provider? No Have you seen a Primary Care Provider in the past? Yes Do you live in [facility]? No Do you live in the [facility]? Yes  Do you have a history of Diabetes No Mother and brother with Type 2   Do you have a history of HTN No Do you have a history of high cholesterol? No Have you been diagnosed with kidney disease or kidney  failure in the past? No Have you been diagnosed with an autoimmune disease? Yes Have you been diagnosed with sleep apnea? No Have you been diagnosed with Asthma or COPD? Yes Do you have a history of tobacco use? Yes Uses cigarettes is interested in quitting   Have you ever suffered a stroke or TIA? No Have you ever had a seizure? No Have you been diagnosed with ADHD? No Have you been diagnosed with anxiety or depression? Yes  Have you had an organ transplant in the past No  Do you have any prescriptions for controlled medications No  Do  you see any specialists currently Yes  Have you had surgery in the past No  Do you take any hormone replacement therapy No   HPI: HPI  Problems:  Patient Active Problem List   Diagnosis Date Noted   Adjustment disorder with mixed anxiety and depressed mood 08/16/2022   Uncomplicated asthma 10/29/2021   Situs inversus abdominalis 05/17/2021   GERD with esophagitis 01/21/2021   Environmental allergies 07/05/2020   Anxiety and depression 05/10/2019   HIV (human immunodeficiency virus infection) (HCC) 01/23/2019   History of syphilis 01/23/2019    Allergies:  Allergies  Allergen Reactions   Shrimp [Shellfish Allergy] Itching and Swelling    Throat swelling   Sulfa Antibiotics Rash   Medications:  Current Outpatient Medications:    acetaminophen  (TYLENOL ) 325 MG tablet, Take 2 tablets (650 mg total) by mouth every 6 (six) hours as needed., Disp: 100 tablet, Rfl: 0   albuterol  (PROVENTIL ) (2.5 MG/3ML) 0.083% nebulizer solution, Take 2.5 mg by nebulization every 6 (six) hours as needed for wheezing., Disp: , Rfl:    albuterol  (VENTOLIN  HFA) 108 (90 Base) MCG/ACT inhaler, Inhale 2 puffs into the lungs every 6 (six) hours as needed for wheezing or shortness of breath., Disp: 8 g, Rfl: 1   amoxicillin -clavulanate (AUGMENTIN ) 400-57 MG/5ML suspension, Take 10.9 mLs (872 mg total) by mouth 2 (two) times daily for 7 days., Disp: 200 mL, Rfl: 0   benzonatate  (TESSALON ) 100 MG capsule, Take 1 capsule (100 mg total) by mouth 3 (three) times daily as needed for cough., Disp: 30 capsule, Rfl: 0   benzonatate  (TESSALON ) 100 MG capsule, Take 1 capsule (100 mg total) by mouth 3 (three) times daily as needed for cough., Disp: 30 capsule, Rfl: 0   dolutegravir  (TIVICAY ) 50 MG tablet, Take 1 tablet (50 mg total) by mouth daily after supper., Disp: 30 tablet, Rfl: 11   doxycycline  (VIBRA -TABS) 100 MG tablet, Take 1 tablet (100 mg total) by mouth 2 (two) times daily., Disp: 14 tablet, Rfl: 0    emtricitabine -tenofovir  AF (DESCOVY ) 200-25 MG tablet, Take 1 tablet by mouth daily after supper., Disp: 30 tablet, Rfl: 11   famotidine  (PEPCID ) 20 MG tablet, Use twice daily for one week then as needed for heartburn up to twice daily prn, Disp: 60 tablet, Rfl: 2   hydrOXYzine  (ATARAX ) 25 MG tablet, Take 1 tablet (25 mg total) by mouth 3 (three) times daily as needed for anxiety., Disp: 90 tablet, Rfl: 1   mirtazapine  (REMERON ) 15 MG tablet, Take 1 tablet (15 mg total) by mouth at bedtime., Disp: 30 tablet, Rfl: 1   nicotine  (NICODERM CQ  - DOSED IN MG/24 HOURS) 14 mg/24hr patch, Place 1 patch (14 mg total) onto the skin daily., Disp: 28 patch, Rfl: 1   nicotine  polacrilex (NICORETTE ) 4 MG gum, Take 1 each (4 mg total) by mouth as needed for smoking cessation., Disp: 100 tablet,  Rfl: 0   omeprazole  (PRILOSEC) 40 MG capsule, Take 1 capsule (40 mg total) by mouth daily. 30 min before breakfast and dinner, Disp: 60 capsule, Rfl: 1   ondansetron  (ZOFRAN -ODT) 4 MG disintegrating tablet, Take 1 tablet (4 mg total) by mouth every 8 (eight) hours as needed., Disp: 20 tablet, Rfl: 0   promethazine -dextromethorphan  (PROMETHAZINE -DM) 6.25-15 MG/5ML syrup, Take 5 mLs by mouth 4 (four) times daily as needed for cough. Do not take with alcohol or while driving or operating heavy machinery.  May cause drowsiness., Disp: 118 mL, Rfl: 0  Observations/Objective: Physical Exam Constitutional:      General: He is not in acute distress. Pulmonary:     Effort: Pulmonary effort is normal.  Neurological:     General: No focal deficit present.     Mental Status: He is alert and oriented to person, place, and time.  Psychiatric:        Mood and Affect: Mood normal.       Assessment and Plan:   1. Housing insecurity (Primary)  - AMB Referral [facility]BCI Care Management  2. HIV infection, unspecified symptom status (HCC)  - AMB Referral [facility] Care Management - Ambulatory referral to Infectious Disease  3.  Financial difficulty  - AMB Referral [facility]I Care Management  4. Gastroesophageal reflux disease with esophagitis, unspecified whether hemorrhage  - famotidine  (PEPCID ) 20 MG tablet; Use twice daily for one week then as needed for heartburn up to twice daily prn  Dispense: 60 tablet; Refill: 2 - omeprazole  (PRILOSEC) 40 MG capsule; Take 1 capsule (40 mg total) by mouth daily. 30 min before breakfast and dinner  Dispense: 60 capsule; Refill: 1  5. Severe episode of recurrent major depressive disorder, without psychotic features (HCC)  - hydrOXYzine  (ATARAX ) 25 MG tablet; Take 1 tablet (25 mg total) by mouth 3 (three) times daily as needed for anxiety.  Dispense: 90 tablet; Refill: 1 - mirtazapine  (REMERON ) 15 MG tablet; Take 1 tablet (15 mg total) by mouth at bedtime.  Dispense: 30 tablet; Refill: 1  6. Insomnia due to other mental disorder  - mirtazapine  (REMERON ) 15 MG tablet; Take 1 tablet (15 mg total) by mouth at bedtime.  Dispense: 30 tablet; Refill: 1  7. Anxiety and depression  - hydrOXYzine  (ATARAX ) 25 MG tablet; Take 1 tablet (25 mg total) by mouth 3 (three) times daily as needed for anxiety.  Dispense: 90 tablet; Refill: 1  8. Nausea  - ondansetron  (ZOFRAN -ODT) 4 MG disintegrating tablet; Take 1 tablet (4 mg total) by mouth every 8 (eight) hours as needed.  Dispense: 20 tablet; Refill: 0  9. Tobacco dependence  - nicotine  polacrilex (NICORETTE ) 4 MG gum; Take 1 each (4 mg total) by mouth as needed for smoking cessation.  Dispense: 100 tablet; Refill: 0 - nicotine  (NICODERM CQ  - DOSED IN MG/24 HOURS) 14 mg/24hr patch; Place 1 patch (14 mg total) onto the skin daily.  Dispense: 28 patch; Refill: 1  Follow Up Instructions: I discussed the assessment and treatment plan with the patient. The Telepresenter provided patient with a physical copy of my written instructions for review.   The patient was advised to call back or seek an in-person evaluation if the symptoms worsen or if  the condition fails to improve as anticipated.    Lauraine Kitty, FNP  **Disclaimer: This note may have been dictated with voice recognition software. Similar sounding words can inadvertently be transcribed and this note may contain transcription errors which may not have been corrected upon  publication of note.**

## 2024-05-30 NOTE — Patient Instructions (Addendum)
  Pharmacy for delivery  [phone]    Office visit for labs August 28th 9am   [address]  [place], [place]   [place] through Investment banker, corporate office off [employer]

## 2024-05-31 ENCOUNTER — Telehealth: Payer: Self-pay

## 2024-05-31 NOTE — Progress Notes (Signed)
 Complex Care Management Note  Care Guide Note 05/31/2024 Name: Kelly Knight MRN: [id number] DOB: September 18, 1996  Kelly Knight is a 28 y.o. year old male who sees Kennyth Domino, FNP for primary care. I reached out to Kelly Knight by phone today to offer complex care management services.  [name] was given information about Complex Care Management services today including:   The Complex Care Management services include support from the care team which includes your Nurse Care Manager, Clinical Social Worker, or Pharmacist.  The Complex Care Management team is here to help remove barriers to the health concerns and goals most important to you. Complex Care Management services are voluntary, and the patient may decline or stop services at any time by request to their care team member.   Complex Care Management Consent Status: Patient agreed to services and verbal consent obtained.   Follow up plan:  Telephone appointment with complex care management team member scheduled for:  [facility] 06/06/2024 LCSW 06/13/2024  Encounter Outcome:  Patient Scheduled  Jeoffrey Buffalo , RMA     [place]  [facility], [facility] Guide  Direct Dial: [phone]  Website: [email or web address]

## 2024-05-31 NOTE — Progress Notes (Signed)
 Care Guide Pharmacy Note  05/31/2024 Name: MACKLEN WILHOITE MRN: [id number] DOB: 04/08/1996  Referred By: Kennyth Domino, FNP Reason for referral: Complex Care Management (Outreach to schedule referral )   ZHION PEVEHOUSE is a 28 y.o. year old male who is a primary care patient of Kennyth Domino, FNP.  Elsie GORMAN Kirks was referred to the pharmacist for assistance related to: Anxiety and Depression  Successful contact was made with the patient to discuss pharmacy services including being ready for the pharmacist to call at least 5 minutes before the scheduled appointment time and to have medication bottles and any blood pressure readings ready for review. The patient agreed to meet with the pharmacist via telephone visit on (date/time).06/07/2024  Jeoffrey Buffalo , RMA     [place]  [facility], [facility] Guide  Direct Dial: [phone]  Website: [email or web address]

## 2024-06-05 ENCOUNTER — Other Ambulatory Visit: Payer: Self-pay

## 2024-06-05 ENCOUNTER — Other Ambulatory Visit (HOSPITAL_COMMUNITY): Payer: Self-pay

## 2024-06-06 ENCOUNTER — Telehealth: Admitting: Nurse Practitioner

## 2024-06-06 ENCOUNTER — Other Ambulatory Visit: Payer: Self-pay | Admitting: *Deleted

## 2024-06-06 VITALS — BP 105/63 | HR 63 | Resp 16

## 2024-06-06 DIAGNOSIS — Z21 Asymptomatic human immunodeficiency virus [HIV] infection status: Secondary | ICD-10-CM

## 2024-06-06 NOTE — Patient Instructions (Signed)
 Kelly Knight - I am sorry I was unable to reach you today for our scheduled appointment. I work with Kennyth Domino, FNP and am calling to support your healthcare needs. Please contact me at [phone] at your earliest convenience. I look forward to speaking with you soon.   Thank you,  Kimani Hovis, RN, BSN, ACM RN Care Manager [employer] [phone]

## 2024-06-06 NOTE — Progress Notes (Signed)
 Pt presents for labs and vitals.

## 2024-06-07 ENCOUNTER — Other Ambulatory Visit: Payer: Self-pay

## 2024-06-07 LAB — CBC WITH DIFFERENTIAL/PLATELET
Basophils Absolute: 0 x10E3/uL (ref 0.0–0.2)
Basos: 1 %
EOS (ABSOLUTE): 0.1 x10E3/uL (ref 0.0–0.4)
Eos: 2 %
Hematocrit: 41.2 % (ref 37.5–51.0)
Hemoglobin: 13 g/dL (ref 13.0–17.7)
Immature Grans (Abs): 0 x10E3/uL (ref 0.0–0.1)
Immature Granulocytes: 0 %
Lymphocytes Absolute: 1.6 x10E3/uL (ref 0.7–3.1)
Lymphs: 45 %
MCH: 28.7 pg (ref 26.6–33.0)
MCHC: 31.6 g/dL (ref 31.5–35.7)
MCV: 91 fL (ref 79–97)
Monocytes Absolute: 0.2 x10E3/uL (ref 0.1–0.9)
Monocytes: 6 %
Neutrophils Absolute: 1.6 x10E3/uL (ref 1.4–7.0)
Neutrophils: 46 %
Platelets: 141 x10E3/uL — ABNORMAL LOW (ref 150–450)
RBC: 4.53 x10E6/uL (ref 4.14–5.80)
RDW: 13.8 % (ref 11.6–15.4)
WBC: 3.5 x10E3/uL (ref 3.4–10.8)

## 2024-06-07 LAB — COMPREHENSIVE METABOLIC PANEL WITH GFR
ALT: 7 IU/L (ref 0–44)
AST: 13 IU/L (ref 0–40)
Albumin: 3.9 g/dL — ABNORMAL LOW (ref 4.3–5.2)
Alkaline Phosphatase: 76 IU/L (ref 44–121)
BUN/Creatinine Ratio: 11 (ref 9–20)
BUN: 11 mg/dL (ref 6–20)
Bilirubin Total: 0.4 mg/dL (ref 0.0–1.2)
CO2: 21 mmol/L (ref 20–29)
Calcium: 8.9 mg/dL (ref 8.7–10.2)
Chloride: 101 mmol/L (ref 96–106)
Creatinine, Ser: 0.97 mg/dL (ref 0.76–1.27)
Globulin, Total: 4.2 g/dL (ref 1.5–4.5)
Glucose: 73 mg/dL (ref 70–99)
Potassium: 4 mmol/L (ref 3.5–5.2)
Sodium: 137 mmol/L (ref 134–144)
Total Protein: 8.1 g/dL (ref 6.0–8.5)
eGFR: 108 mL/min/1.73 (ref >59)

## 2024-06-07 LAB — HGB A1C W/O EAG: Hgb A1c MFr Bld: 5.5 % (ref 4.8–5.6)

## 2024-06-07 LAB — VITAMIN D 25 HYDROXY (VIT D DEFICIENCY, FRACTURES): Vit D, 25-Hydroxy: 28.5 ng/mL — ABNORMAL LOW (ref 30.0–100.0)

## 2024-06-07 LAB — LIPID PANEL
Chol/HDL Ratio: 3.3 ratio (ref 0.0–5.0)
Cholesterol, Total: 126 mg/dL (ref 100–199)
HDL: 38 mg/dL — ABNORMAL LOW (ref >39)
LDL Chol Calc (NIH): 76 mg/dL (ref 0–99)
Triglycerides: 57 mg/dL (ref 0–149)
VLDL Cholesterol Cal: 12 mg/dL (ref 5–40)

## 2024-06-07 MED ORDER — ALBUTEROL SULFATE HFA 108 (90 BASE) MCG/ACT IN AERS
2.0000 | INHALATION_SPRAY | Freq: Four times a day (QID) | RESPIRATORY_TRACT | 0 refills | Status: AC | PRN
  Filled 2024-06-07: qty 18, 25d supply, fill #0

## 2024-06-07 NOTE — Progress Notes (Signed)
 06/07/2024 Name: Kelly Knight MRN: [id number] DOB: 06-02-1996  Chief Complaint  Patient presents with   Medication Management    Kelly Knight is a 28 y.o. year old male who presented for a telephone visit.   They were referred to the pharmacist by their PCP for assistance in managing medication access.    Subjective:  Care Team: Primary Care Provider: Kennyth Domino, FNP    Medication Access/Adherence  Current Pharmacy:  [id number] - [place], [place] - [address] AT [facility] OF [address] [place] [address] Phone: [phone] Fax: [phone]  [facility] [phone] @ [address] [place], [place] - [address] [place] [address] Phone: [phone] Fax: [phone]  [place] - [facility] [address] [place] [address] Phone: [phone] Fax: [phone]  [facility] [address] [place] [address] Phone: [phone] Fax: [phone]  [facility] [phone] - [place], [place] - [address] AT [facility] OF [address] [place] [address] Phone: [phone] Fax: [phone]  [id number] - [place], [place] - [address] AT [facility] OF [address] [place] [address] Phone: [phone] Fax: [phone]  [facility] [address] [place] [address] Phone: [phone] Fax: [phone]   Patient reports affordability concerns with their medications: Yes  - insurance issues (see below) Patient reports access/transportation concerns to their pharmacy: No  Patient reports adherence concerns with their medications:  No     Medication Management: -Patient has been unable to pick up any medications prescribed by  PCP last week due to medicaid having an invalid other primary insurance block that is preventing the pharmacy from processing via insurance so patient can afford -No refills for HIV meds yet, has to re-establish care with ID clinic, appt scheduled 06/18/24 -Out of inhaler, patient is concerned about this and would like a refill if possible -Still interested in smoking cessation, has not been able to pick up NRT products due to insurance issues mentioned above  Objective:  Lab Results  Component Value Date   HGBA1C 5.5 06/06/2024    Lab Results  Component Value Date   CREATININE 0.97 06/06/2024   BUN 11 06/06/2024   NA 137 06/06/2024   K 4.0 06/06/2024   CL 101 06/06/2024   CO2 21 06/06/2024    Lab Results  Component Value Date   CHOL 126 06/06/2024   HDL 38 (L) 06/06/2024   LDLCALC 76 06/06/2024   TRIG 57 06/06/2024   CHOLHDL 3.3 06/06/2024    Medications Reviewed Today     Reviewed by Lionell Jon DEL, RPH (Pharmacist) on 06/07/24 at 1034  Med List Status: <None>   Medication Order Taking? Sig Documenting Provider Last Dose Status Informant  acetaminophen  (TYLENOL ) 325 MG tablet [id number] Yes Take 2 tablets (650 mg total) by mouth every 6 (six) hours as needed. Elnor Savant A, DO  Active   albuterol  (PROVENTIL ) (2.5 MG/3ML) 0.083% nebulizer solution [id number] Yes Take 2.5 mg by nebulization every 6 (six) hours as needed for wheezing. [provider]  Active Self  albuterol  (VENTOLIN  HFA) 108 (90 Base) MCG/ACT inhaler [id number]  Inhale 2 puffs into the lungs every 6 (six) hours as needed for wheezing or shortness of  breath. Rising, [place], PA-C  Active   amoxicillin -clavulanate (AUGMENTIN ) 400-57 MG/5ML suspension [id number]  Take 10.9 mLs (872 mg total) by mouth 2 (two) times daily for 7 days.  Patient not taking: Reported on 06/07/2024   Chandra Raisin A, NP  Active   benzonatate  (TESSALON ) 100 MG capsule [id number]  Take 1 capsule (100 mg total) by mouth 3  (three) times daily as needed for cough.  Patient not taking: Reported on 06/07/2024   Gladis Elsie BROCKS, PA-C  Active   benzonatate  (TESSALON ) 100 MG capsule [id number]  Take 1 capsule (100 mg total) by mouth 3 (three) times daily as needed for cough.  Patient not taking: Reported on 06/07/2024   Gladis Elsie BROCKS, PA-C  Active   dolutegravir  (TIVICAY ) 50 MG tablet [id number]  Take 1 tablet (50 mg total) by mouth daily after supper. Melvenia Corean SAILOR, NP  Active   doxycycline  (VIBRA -TABS) 100 MG tablet [id number]  Take 1 tablet (100 mg total) by mouth 2 (two) times daily.  Patient not taking: Reported on 06/07/2024   Gladis Elsie BROCKS, PA-C  Active   emtricitabine -tenofovir  AF (DESCOVY ) 200-25 MG tablet [id number]  Take 1 tablet by mouth daily after supper. Melvenia Corean SAILOR, NP  Active   famotidine  (PEPCID ) 20 MG tablet [id number]  Take 1 tablet (20 mg total) by mouth 2 (two) times daily for 7 days, THEN 1 tablet (20 mg total) 2 (two) times daily as needed. Kennyth Domino, FNP  Active   hydrOXYzine  (ATARAX ) 25 MG tablet [id number]  Take 1 tablet (25 mg total) by mouth 3 (three) times daily as needed for anxiety. Kennyth Domino, FNP  Active   mirtazapine  (REMERON ) 15 MG tablet [id number]  Take 1 tablet (15 mg total) by mouth at bedtime. Kennyth Domino, FNP  Active   nicotine  (NICODERM CQ  - DOSED IN MG/24 HOURS) 14 mg/24hr patch [id number]  Place 1 patch (14 mg total) onto the skin daily. Kennyth Domino, FNP  Active   nicotine  polacrilex (NICORETTE ) 4 MG gum [id number]  Take 1 each (4 mg total) by mouth as needed for smoking cessation. Kennyth Domino, FNP  Active   omeprazole  (PRILOSEC) 40 MG capsule [id number]  Take 1 capsule (40 mg total) by mouth daily 30 minutes before breakfast AND 1 capsule (40 mg total) daily 30 minutes before supper. Kennyth Domino, FNP  Active   ondansetron  (ZOFRAN -ODT) 4 MG disintegrating tablet [id number]  Take 1 tablet (4 mg total) by mouth every 8 (eight) hours as needed. Kennyth Domino,  FNP  Active   promethazine -dextromethorphan  (PROMETHAZINE -DM) 6.25-15 MG/5ML syrup [id number]  Take 5 mLs by mouth 4 (four) times daily as needed for cough. Do not take with alcohol or while driving or operating heavy machinery.  May cause drowsiness. Chandra Raisin LABOR, NP  Active               Assessment/Plan:   Medication Management: -Provided patient with instructions for how to get medicaid to resolve insurance issues -Keep appt with [facility] on 06/18/24 -Will coordinate with PCP to send in refill for albuterol  inhaler  -Follow Up scheduled to discuss smoking cessation and quit goal next week. Patient aware to reach out sooner if insurance issues continue past Monday   Follow Up Plan: 06/14/24  Jon VEAR Lindau, PharmD Clinical Pharmacist [phone]

## 2024-06-12 ENCOUNTER — Encounter: Payer: Self-pay | Admitting: Nurse Practitioner

## 2024-06-12 ENCOUNTER — Telehealth: Payer: Self-pay | Admitting: Nurse Practitioner

## 2024-06-12 ENCOUNTER — Telehealth: Payer: Self-pay

## 2024-06-12 ENCOUNTER — Telehealth

## 2024-06-12 ENCOUNTER — Other Ambulatory Visit: Payer: Self-pay

## 2024-06-12 ENCOUNTER — Encounter

## 2024-06-12 NOTE — Telephone Encounter (Signed)
 Patient missed work yesterday due to acid reflux, he has communicated with the [facility] regarding medication assistance. He is currently having issues with Medicaid showing duplicate coverage.

## 2024-06-12 NOTE — Progress Notes (Signed)
   06/12/2024  Patient ID: Kelly Knight, male   DOB: [birth year], 28 y.o.   MRN: [id number]  Received message from PCP stating that patient was still having issues using his [place] medicaid for rx benefits.  Contacted patient, he had not contacted insurance yet to remove other primary plan because he could not locate the phone number. Provided patient with number and he states he will call them now.  Jon VEAR Lindau, PharmD Clinical Pharmacist [phone]

## 2024-06-13 ENCOUNTER — Other Ambulatory Visit: Payer: Self-pay | Admitting: Licensed Clinical Social Worker

## 2024-06-13 ENCOUNTER — Ambulatory Visit: Admitting: Nurse Practitioner

## 2024-06-13 NOTE — Patient Outreach (Signed)
 Complex Care Management   Visit Note  06/13/2024  Name:  Kelly Knight MRN: [id number] DOB: [birth year]  Situation: Referral received for Complex Care Management related to SDOH Barriers:  Transportation Housing Insecurity Financial Resource Strain I obtained verbal consent from Patient.  Visit completed with Patient  on the phone  Background:   Past Medical History:  Diagnosis Date   Asthma    Heart murmur    HIV (human immunodeficiency virus infection) (HCC) 01/23/2019   Situs inversus    Syphilis 01/23/2019    Assessment: Patient Reported Symptoms:  Cognitive Cognitive Status: Alert and oriented to person, place, and time, Normal speech and language skills, Insightful and able to interpret abstract concepts Cognitive/Intellectual Conditions Management [RPT]: None reported or documented in medical history or problem list   Health Maintenance Behaviors: Annual physical exam, Stress management Healing Pattern: Unsure Health Facilitated by: Stress management  Neurological Neurological Review of Symptoms: No symptoms reported    HEENT HEENT Symptoms Reported: No symptoms reported      Cardiovascular Cardiovascular Symptoms Reported: No symptoms reported    Respiratory Respiratory Symptoms Reported: No symptoms reported    Endocrine Endocrine Symptoms Reported: No symptoms reported    Gastrointestinal Gastrointestinal Symptoms Reported: No symptoms reported      Genitourinary Genitourinary Symptoms Reported: No symptoms reported    Integumentary Integumentary Symptoms Reported: No symptoms reported    Musculoskeletal Musculoskelatal Symptoms Reviewed: No symptoms reported        Psychosocial Psychosocial Symptoms Reported: Anxiety - if selected complete GAD, Depression - if selected complete PHQ 2-9 Additional Psychological Details: Reviewed pt's current stressors - pt was previously in counseling and is interested in starting again. CSW was able to send counseling  options via e-mail and pt agreed to review them Behavioral Health Self-Management Outcome: 3 (uncertain)   Quality of Family Relationships: involved Do you feel physically threatened by others?: No        Recommendation:   SDOH assessments completed: Financial Strain , Housing , and Transportation Evaluation of current treatment plan related to unmet needs Reviewed housing search and support options - will e-mail list of available resources    Follow Up Plan:   Telephone follow up appointment date/time:  07/04/2024  Alm Armor, LCSW [place], [facility] Licensed Clinical Social Worker Care Coordinator [phone]

## 2024-06-14 ENCOUNTER — Telehealth: Payer: Self-pay

## 2024-06-14 ENCOUNTER — Other Ambulatory Visit: Payer: Self-pay

## 2024-06-14 ENCOUNTER — Other Ambulatory Visit (HOSPITAL_COMMUNITY): Payer: Self-pay

## 2024-06-14 NOTE — Telephone Encounter (Signed)
 Pharmacy Patient Advocate Encounter  Insurance verification completed.   The patient is insured through [facility] MEDICAID   Ran test claim for [employer]. Currently a quantity of 30 is a 30 day supply and the co-pay is $0.00 . [place] $0.00  This test claim was processed through [facility]- copay amounts may vary at other pharmacies due to pharmacy/plan contracts, or as the patient moves through the different stages of their insurance plan.

## 2024-06-14 NOTE — Progress Notes (Signed)
   06/14/2024  Patient ID: Kelly Knight, male   DOB: 27-Sep-1996, 28 y.o.   MRN: [id number]  Attempted to contact patient for scheduled appointment for medication management. Left HIPAA compliant message for patient to return my call at their convenience.   Jon VEAR Lindau, PharmD Clinical Pharmacist [phone]

## 2024-06-17 ENCOUNTER — Other Ambulatory Visit (HOSPITAL_COMMUNITY): Payer: Self-pay

## 2024-06-17 ENCOUNTER — Other Ambulatory Visit: Payer: Self-pay

## 2024-06-17 ENCOUNTER — Telehealth (INDEPENDENT_AMBULATORY_CARE_PROVIDER_SITE_OTHER): Admitting: Nurse Practitioner

## 2024-06-17 ENCOUNTER — Encounter: Payer: Self-pay | Admitting: Nurse Practitioner

## 2024-06-17 VITALS — BP 111/72 | HR 120 | Temp 99.2°F

## 2024-06-17 DIAGNOSIS — K047 Periapical abscess without sinus: Secondary | ICD-10-CM | POA: Diagnosis not present

## 2024-06-17 MED ORDER — PENICILLIN V POTASSIUM 125 MG/5ML PO SOLR
500.0000 mg | Freq: Three times a day (TID) | ORAL | 0 refills | Status: DC
  Filled 2024-06-17: qty 600, 10d supply, fill #0

## 2024-06-17 MED ORDER — LIDOCAINE VISCOUS HCL 2 % MT SOLN
15.0000 mL | OROMUCOSAL | 0 refills | Status: DC | PRN
  Filled 2024-06-17: qty 100, 6d supply, fill #0

## 2024-06-17 MED ORDER — IBUPROFEN 600 MG PO TABS
600.0000 mg | ORAL_TABLET | Freq: Four times a day (QID) | ORAL | 0 refills | Status: AC | PRN
  Filled 2024-06-17: qty 60, 15d supply, fill #0

## 2024-06-17 MED ORDER — PENICILLIN V POTASSIUM 125 MG/5ML PO SOLR: 500.0000 mg | Freq: Three times a day (TID) | ORAL | 0 refills | Status: AC

## 2024-06-17 NOTE — Progress Notes (Signed)
 The patient presented for a primary care visit on (date) to establish care. Blood pressure screening was conducted, and the result was (111/72). During the appointment, the patient did not indicate having (SDOH) needs.   A review of the patient's chart revealed that they do not currently have a primary care provider and pt does have a future appt  on 06/18/2024 and 07/04/2024. At this time, no additional support from the [facility] team is necessary.

## 2024-06-17 NOTE — Progress Notes (Unsigned)
 Acute Office Visit  Virtual Visit Consent:   Provider on site    Subjective:   Location: Patient: [facility] Provider: Virtual Visit Location Provider: Office/Clinic    HPI Kelly Knight is a 28 y.o. who identifies as a male who was assigned male at birth, and is being seen today for swelling in left face and pain in upper left back molar .  States he believes there is a hole in that tooth and that a chicken bone irritated it this weekend, he woke up today with left facial swelling possible temperature and tenderness to left back upper molar.   He did take ibuprofen  today  Patient is looking for a dentist currently   Has apt with Infectious disease tomorrow      Objective:    BP 111/72   Pulse (!) 120   Temp 99.2 F (37.3 C)    Physical Exam Constitutional:      General: He is not in acute distress.    Appearance: He is ill-appearing.  HENT:     Head:      Comments: Swelling and tenderness to left face     Nose: Nose normal.     Mouth/Throat:     Mouth: Mucous membranes are moist.     Dentition: Dental tenderness present. No gum lesions.     Pharynx: Oropharynx is clear.   Pulmonary:     Effort: Pulmonary effort is normal.  Musculoskeletal:     Cervical back: Neck supple.  Neurological:     Mental Status: He is alert and oriented to person, place, and time.           Assessment & Plan:   1. Dental abscess (Primary)  - lidocaine  (XYLOCAINE ) 2 % solution; Use as directed 15 mLs in the mouth or throat as needed for mouth pain.  Dispense: 100 mL; Refill: 0 - ibuprofen  (ADVIL ) 600 MG tablet; Take 1 tablet (600 mg total) by mouth every 6 (six) hours as needed for moderate pain (pain score 4-6).  Dispense: 60 tablet; Refill: 0 - penicillin  potassium (VEETID) 125 MG/5ML solution; Take 20 mLs (500 mg total) by mouth 3 (three) times daily for 10 days.  Dispense: 600 mL; Refill: 0  Follow Up Instructions: I discussed the assessment and treatment plan  with the patient. The patient was provided an opportunity to ask questions and all were answered. The patient agreed with the plan and demonstrated an understanding of the instructions.  A copy of instructions were sent to the patient via MyChart unless otherwise noted below.    The patient was advised to call back or seek an in-person evaluation if the symptoms worsen or if the condition fails to improve as anticipated.    Lauraine Kitty, FNP  **Disclaimer: This note may have been dictated with voice recognition software. Similar sounding words can inadvertently be transcribed and this note may contain transcription errors which may not have been corrected upon publication of note.**

## 2024-06-18 ENCOUNTER — Other Ambulatory Visit: Payer: Self-pay

## 2024-06-18 ENCOUNTER — Other Ambulatory Visit (HOSPITAL_COMMUNITY): Payer: Self-pay

## 2024-06-18 ENCOUNTER — Encounter: Payer: Self-pay | Admitting: Infectious Diseases

## 2024-06-18 ENCOUNTER — Ambulatory Visit (INDEPENDENT_AMBULATORY_CARE_PROVIDER_SITE_OTHER): Admitting: Infectious Diseases

## 2024-06-18 ENCOUNTER — Other Ambulatory Visit: Payer: Self-pay | Admitting: Pharmacist

## 2024-06-18 VITALS — BP 115/75 | HR 85 | Temp 98.2°F | Ht 69.0 in | Wt 131.0 lb

## 2024-06-18 DIAGNOSIS — Z21 Asymptomatic human immunodeficiency virus [HIV] infection status: Secondary | ICD-10-CM | POA: Diagnosis present

## 2024-06-18 DIAGNOSIS — A539 Syphilis, unspecified: Secondary | ICD-10-CM

## 2024-06-18 DIAGNOSIS — K0490 Unspecified diseases of pulp and periapical tissues: Secondary | ICD-10-CM | POA: Diagnosis not present

## 2024-06-18 DIAGNOSIS — R45851 Suicidal ideations: Secondary | ICD-10-CM

## 2024-06-18 DIAGNOSIS — F339 Major depressive disorder, recurrent, unspecified: Secondary | ICD-10-CM

## 2024-06-18 DIAGNOSIS — Z91148 Patient's other noncompliance with medication regimen for other reason: Secondary | ICD-10-CM | POA: Diagnosis not present

## 2024-06-18 MED ORDER — DOLUTEGRAVIR SODIUM 50 MG PO TABS
50.0000 mg | ORAL_TABLET | Freq: Every day | ORAL | 6 refills | Status: AC
  Filled 2024-06-18: qty 30, 30d supply, fill #0
  Filled 2024-07-25: qty 30, 30d supply, fill #1

## 2024-06-18 MED ORDER — DESCOVY 200-25 MG PO TABS
1.0000 | ORAL_TABLET | Freq: Every day | ORAL | 6 refills | Status: AC
Start: 2024-06-18 — End: ?
  Filled 2024-06-18: qty 30, 30d supply, fill #0
  Filled 2024-07-25: qty 30, 30d supply, fill #1

## 2024-06-18 NOTE — Progress Notes (Signed)
 Specialty Pharmacy Initial Fill Coordination Note  Kelly Knight is a 28 y.o. male contacted today regarding initial fill of specialty medication(s) Dolutegravir  Sodium (TIVICAY ); Emtricitabine -Tenofovir  AF (Descovy )   Patient requested Delivery   Delivery date: 06/20/24   Verified address: [address] [place] [address]   Medication will be filled on 06/19/24.   Patient is aware of $0 copayment.

## 2024-06-18 NOTE — Progress Notes (Signed)
 Specialty Pharmacy Initiation Note   Kelly Knight is a 28 y.o. male who will be followed by the specialty pharmacy service for RxSp HIV    Review of administration, indication, effectiveness, safety, potential side effects, storage/disposable, and missed dose instructions occurred today for patient's specialty medication(s) Dolutegravir  Sodium (TIVICAY ); Emtricitabine -Tenofovir  AF (Descovy )     Patient/Caregiver did not have any additional questions or concerns.   Patient's therapy is appropriate to: Initiate    Goals Addressed             This Visit's Progress    Achieve Undetectable HIV Viral Load < 20       Patient is not on track and no change. Patient will be evaluated at upcoming provider appointment to assess progress      Comply with lab assessments       Patient is not on track and improving. Patient will adhere to provider and/or lab appointments      Maintain optimal adherence to therapy       Patient is not on track and improving. Patient will work on increased adherence         Alan JINNY Geralds Specialty Pharmacist

## 2024-06-18 NOTE — Patient Instructions (Addendum)
 Please start back your tivicay  + descovy  crushed TOGETHER once a day  Would be best to get this under better control especially as you recover with this dental abscess.   I am so grateful you have a dental appointment tomorrow    Please call to set up a new client appointment for counseling to work through what we talked about:   When you call please ask for [facility] [place]: Address: [address], [place], [place] [address] Phone: [phone] Appointments: [email or web address]

## 2024-06-18 NOTE — Progress Notes (Signed)
 Name: Kelly Knight  DOB: 1996/09/08 MRN: [id number] PCP: Kennyth Domino, FNP     Brief Narrative:  Kelly Knight is a 28 y.o. male with HIV disease, Dx 10/2018. CD4 nadir unknown.  HIV Risk: MSM History of OIs: none Intake Labs 01/2019: Hep B sAg (-) sAb (-)  cAb (-); hep A (+); hep c Ab (-) VL 223,000 CD4  RPR 1:2056 --> 1:128 --> 1:64  Quantiferon (-) HLA*B 5701 (-)   Previous Regimens: Tivicay  + Descovy  (needs crushable regimen)   Genotypes: 01/2019: K103N   Subjective:   Chief Complaint  Patient presents with   Follow-up     Discussed the use of AI scribe software for clinical note transcription with the patient, who gave verbal consent to proceed.  History of Present Illness   Kelly Knight is a 28 year old male with HIV who presents to reengage in HIV care.  He has been inconsistent with his HIV medication regimen, including Tivicay  and Descovy , with only five fills since December 2022. His viral load in March 2024 was 84,000, and his last CD4 count in January 2024 was 267. No new symptoms or concerns for new infections.  He has a dental abscess and reports that a molar was identified as problematic last year during wisdom teeth removal. Pain increased after eating a chicken wing, and he has a dental appointment scheduled for tomorrow.  He has a history of high serofast syphilis titers, with a titer of 1:32.  He is experiencing significant mental health challenges, including feelings of anger and frustration related to his living situation and past experiences. He has moments of feeling overwhelmed and desires counseling. He is currently living with a church member after experiencing homelessness and is working to get back on his feet.  He has a family history of diabetes, with his mother, brother, and grandmother affected. He is concerned about prediabetes as his recent A1c screening indicated he is close to prediabetic levels. He is of normal  weight.  He is using a nicotine  patch and gum to aid in smoking cessation. He has recently started working again and is in contact with [facility] for housing assistance.        06/18/2024    1:51 PM 06/06/2024   11:35 AM 12/20/2022   11:05 AM  Depression screen PHQ 2/9  Decreased Interest 3 0 0  Down, Depressed, Hopeless 3 0 0  PHQ - 2 Score 6 0 0  Altered sleeping 2 0   Tired, decreased energy 3 0   Change in appetite 3 0   Feeling bad or failure about yourself  3 0   Trouble concentrating 3 0   Moving slowly or fidgety/restless 3 0   Suicidal thoughts 2 0   PHQ-9 Score 25 0   Difficult doing work/chores Extremely dIfficult Not difficult at all       06/18/2024    1:51 PM 11/21/2019    9:25 AM  GAD 7 : Generalized Anxiety Score  Nervous, Anxious, on Edge 3 3  Control/stop worrying 3 2  Worry too much - different things 3 3  Trouble relaxing 2 2  Restless 3 2  Easily annoyed or irritable 3 1  Afraid - awful might happen 3 2  Total GAD 7 Score 20 15  Anxiety Difficulty Extremely difficult       Lab Results  Component Value Date   CD4TCELL 14 (L) 10/27/2022   CD4TABS 267 (L) 10/27/2022  Wt Readings from Last 3 Encounters:  06/18/24 131 lb (59.4 kg)  09/15/23 129 lb (58.5 kg)  04/21/23 132 lb (59.9 kg)    Review of Systems  Constitutional:  Negative for appetite change, chills, fatigue, fever and unexpected weight change.  Eyes:  Negative for visual disturbance.  Respiratory:  Negative for cough and shortness of breath.   Cardiovascular:  Negative for chest pain and leg swelling.  Gastrointestinal:  Negative for abdominal pain, diarrhea and nausea.  Genitourinary:  Negative for dysuria, genital sores and penile discharge.  Musculoskeletal:  Negative for joint swelling.  Skin:  Negative for color change and rash.  Neurological:  Negative for dizziness and headaches.  Hematological:  Negative for adenopathy.  Psychiatric/Behavioral:  Positive for  agitation. Negative for sleep disturbance. The patient is not nervous/anxious.     Past Medical History:  Diagnosis Date   Asthma    Heart murmur    HIV (human immunodeficiency virus infection) (HCC) 01/23/2019   Situs inversus    Syphilis 01/23/2019    Outpatient Medications Prior to Visit  Medication Sig Dispense Refill   acetaminophen  (TYLENOL ) 325 MG tablet Take 2 tablets (650 mg total) by mouth every 6 (six) hours as needed. 100 tablet 0   albuterol  (PROVENTIL ) (2.5 MG/3ML) 0.083% nebulizer solution Take 2.5 mg by nebulization every 6 (six) hours as needed for wheezing.     albuterol  (VENTOLIN  HFA) 108 (90 Base) MCG/ACT inhaler Inhale 2 puffs into the lungs every 6 (six) hours as needed for wheezing or shortness of breath. 8 g 1   albuterol  (VENTOLIN  HFA) 108 (90 Base) MCG/ACT inhaler Inhale 2 puffs into the lungs every 6 (six) hours as needed for wheezing or shortness of breath. 18 g 0   dolutegravir  (TIVICAY ) 50 MG tablet Take 1 tablet (50 mg total) by mouth daily after supper. 30 tablet 11   emtricitabine -tenofovir  AF (DESCOVY ) 200-25 MG tablet Take 1 tablet by mouth daily after supper. 30 tablet 11   famotidine  (PEPCID ) 20 MG tablet Take 1 tablet (20 mg total) by mouth 2 (two) times daily for 7 days, THEN 1 tablet (20 mg total) 2 (two) times daily as needed. 60 tablet 2   hydrOXYzine  (ATARAX ) 25 MG tablet Take 1 tablet (25 mg total) by mouth 3 (three) times daily as needed for anxiety. 90 tablet 1   ibuprofen  (ADVIL ) 600 MG tablet Take 1 tablet (600 mg total) by mouth every 6 (six) hours as needed for moderate pain (pain score 4-6). (Patient not taking: Reported on 06/18/2024) 60 tablet 0   lidocaine  (XYLOCAINE ) 2 % solution Use as directed 15 mLs in the mouth or throat as needed for mouth pain. 100 mL 0   mirtazapine  (REMERON ) 15 MG tablet Take 1 tablet (15 mg total) by mouth at bedtime. 30 tablet 1   nicotine  (NICODERM CQ  - DOSED IN MG/24 HOURS) 14 mg/24hr patch Place 1 patch (14 mg  total) onto the skin daily. 28 patch 1   nicotine  polacrilex (NICORETTE ) 4 MG gum Take 1 each (4 mg total) by mouth as needed for smoking cessation. 100 tablet 0   omeprazole  (PRILOSEC) 40 MG capsule Take 1 capsule (40 mg total) by mouth daily 30 minutes before breakfast AND 1 capsule (40 mg total) daily 30 minutes before supper. 60 capsule 1   ondansetron  (ZOFRAN -ODT) 4 MG disintegrating tablet Take 1 tablet (4 mg total) by mouth every 8 (eight) hours as needed. 20 tablet 0   penicillin  potassium (VEETID) 125 MG/5ML  solution Take 20 mLs (500 mg total) by mouth 3 (three) times daily for 10 days. 600 mL 0   No facility-administered medications prior to visit.     Allergies  Allergen Reactions   Shrimp [Shellfish Allergy] Itching and Swelling    Throat swelling   Sulfa Antibiotics Rash    Social History   Tobacco Use   Smoking status: Former    Types: Cigarettes    Passive exposure: Never   Smokeless tobacco: Never   Tobacco comments:    Stopped vaping   Vaping Use   Vaping status: Former   Substances: THC  Substance Use Topics   Alcohol use: Not Currently    Comment: socially    Drug use: Not Currently    Frequency: 7.0 times per week    Types: Marijuana    Social History   Substance and Sexual Activity  Sexual Activity Not Currently   Partners: Male   Comment: declined condoms     Objective:   Vitals:   06/18/24 1344  BP: 115/75  Pulse: 85  Temp: 98.2 F (36.8 C)  TempSrc: Temporal  SpO2: 97%  Weight: 131 lb (59.4 kg)  Height: 5' 9 (1.753 m)   Body mass index is 19.35 kg/m.  Physical Exam Vitals reviewed.  Constitutional:      Appearance: He is well-developed.     Comments: Seated comfortably in chair during visit.   HENT:     Mouth/Throat:     Dentition: Normal dentition. No dental abscesses.  Cardiovascular:     Rate and Rhythm: Normal rate and regular rhythm.     Heart sounds: Normal heart sounds.  Pulmonary:     Effort: Pulmonary effort is  normal.     Breath sounds: Normal breath sounds.  Abdominal:     General: There is no distension.     Palpations: Abdomen is soft.     Tenderness: There is no abdominal tenderness.  Lymphadenopathy:     Cervical: No cervical adenopathy.  Skin:    General: Skin is warm and dry.     Findings: No rash.  Neurological:     Mental Status: He is alert and oriented to person, place, and time.  Psychiatric:        Judgment: Judgment normal.     Lab Results Lab Results  Component Value Date   WBC 3.5 06/06/2024   HGB 13.0 06/06/2024   HCT 41.2 06/06/2024   MCV 91 06/06/2024   PLT 141 (L) 06/06/2024    Lab Results  Component Value Date   CREATININE 0.97 06/06/2024   BUN 11 06/06/2024   NA 137 06/06/2024   K 4.0 06/06/2024   CL 101 06/06/2024   CO2 21 06/06/2024    Lab Results  Component Value Date   ALT 7 06/06/2024   AST 13 06/06/2024   ALKPHOS 76 06/06/2024   BILITOT 0.4 06/06/2024    Lab Results  Component Value Date   CHOL 126 06/06/2024   HDL 38 (L) 06/06/2024   LDLCALC 76 06/06/2024   TRIG 57 06/06/2024   CHOLHDL 3.3 06/06/2024   HIV 1 RNA Quant  Date Value  12/20/2022 84,500 Copies/mL (H)  10/27/2022 98,000 copies/mL (H)  05/05/2022 90,400 Copies/mL (H)   CD4 T Cell Abs (/uL)  Date Value  10/27/2022 267 (L)  05/05/2022 228 (L)  06/03/2021 228 (L)     Assessment & Plan:     HIV infection -  HIV infection with poor adherence to Tivicay   and Descovy , viral load of 84,000 in March 2024, and CD4 count of 267 in January 2024. Sporadic medication fills with only five since December 2022. Currently not on antiretroviral therapy since October of last year by his estimate due to other priorities (homeless, mental health). He would like to go back on the same regimen - discussed drawing genotype today to ensure this will still work. He needs something small and crushable.  - Order updated genotype to assess for resistance. - Restart Tivicay  and Descovy . - Send  medications to mail order pharmacy for delivery. - Ensure medications are crushed for administration. - Schedule follow-up in 4-6 weeks to review lab results and assess treatment efficacy.  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 No new symptoms or exposures reported. - Order syphilis titer to monitor for changes.  Dental abscess - Dental abscess causing significant pain and difficulty opening mouth. History of wisdom teeth extraction with a recommendation for molar removal. Currently on penicillin  with a dental appointment scheduled for further evaluation and treatment. - Continue penicillin  for dental abscess. - Attend dental appointment for evaluation and potential drainage or extraction. - Provide work note for absence due to dental appointment.  Major depressive disorder with SI -  Major depressive disorder with suicidal ideation, feelings of anger, hopelessness, and unresolved issues related to housing instability and personal relationships. Expresses moments of suicidal ideation but maintains some hope. Interested in counseling for guided reflection and trauma processing.  - Refer to counseling services, specifically [facility] services, for trauma and mental health support. - Monitor mental health closely and ensure safety.  Family history of DM - Prediabetes with a family history of diabetes and recent A1c screening close to prediabetic range. Discussed the role of insulin resistance and the importance of lifestyle modifications. - Order fasting insulin level to assess for insulin resistance. - Discuss lifestyle modifications including diet and exercise to manage prediabetes.  Visit duration: 27 minutes         Corean Fireman, MSN, NP-C [facility]  [email or web address] Pager: [phone] Office: [phone] [facility] Main Line: [phone]   06/18/24  1:52  PM

## 2024-06-19 ENCOUNTER — Ambulatory Visit: Payer: Self-pay | Admitting: Infectious Diseases

## 2024-06-19 ENCOUNTER — Other Ambulatory Visit (HOSPITAL_COMMUNITY): Payer: Self-pay

## 2024-06-19 ENCOUNTER — Other Ambulatory Visit: Payer: Self-pay

## 2024-06-19 DIAGNOSIS — B20 Human immunodeficiency virus [HIV] disease: Secondary | ICD-10-CM

## 2024-06-19 LAB — T-HELPER CELLS (CD4) COUNT (NOT AT ARMC)
CD4 % Helper T Cell: 13 % — ABNORMAL LOW (ref 33–65)
CD4 T Cell Abs: 133 /uL — ABNORMAL LOW (ref 400–1790)

## 2024-06-19 MED ORDER — ATOVAQUONE 750 MG/5ML PO SUSP
1500.0000 mg | Freq: Every day | ORAL | 2 refills | Status: AC
  Filled 2024-06-19: qty 300, 30d supply, fill #0

## 2024-06-19 NOTE — Telephone Encounter (Signed)
 Patient aware of results and medication for dental infection. Patient stated that he is having dental surgery on 9/16

## 2024-06-19 NOTE — Telephone Encounter (Signed)
-----   Message from [place] sent at 06/19/2024  2:14 PM EDT ----- Well Will's CD4 is pretty low - it could be partially due to the dental infection too. Only 133.  We do need to start him on preventative antibiotics until he does better getting his HIV infection under better control. Will need to send in Atovaquone  suspension for him - 10 mL taken once daily (I  will send to pharmacy where I sent his Tivicay  and Descovy ). This is in addition to his dental antibiotics as they cover different bacteria each.  ----- Message ----- From: Interface, Lab In [place] Sent: 06/19/2024   1:58 PM EDT To: Corean LOISE Fireman, NP

## 2024-06-20 ENCOUNTER — Other Ambulatory Visit: Payer: Self-pay

## 2024-06-25 ENCOUNTER — Other Ambulatory Visit: Payer: Self-pay

## 2024-06-26 ENCOUNTER — Other Ambulatory Visit: Payer: Self-pay

## 2024-06-26 ENCOUNTER — Telehealth: Admitting: Nurse Practitioner

## 2024-06-26 DIAGNOSIS — F17200 Nicotine dependence, unspecified, uncomplicated: Secondary | ICD-10-CM | POA: Diagnosis not present

## 2024-06-26 DIAGNOSIS — K0889 Other specified disorders of teeth and supporting structures: Secondary | ICD-10-CM | POA: Diagnosis not present

## 2024-06-26 MED ORDER — NICOTINE 7 MG/24HR TD PT24
7.0000 mg | MEDICATED_PATCH | Freq: Every day | TRANSDERMAL | 7 refills | Status: DC
  Filled 2024-06-26: qty 28, 28d supply, fill #0

## 2024-06-26 NOTE — Progress Notes (Signed)
 Acute Video Visit    Virtual Visit Consent:   Kelly Knight, you are scheduled for a virtual visit with a [facility] provider today.     Just as with appointments in the office, your consent must be obtained to participate.  Your consent will be active for this visit and any virtual visit you may have with one of our providers in the next 365 days.     If you have a MyChart account, a copy of this consent can be sent to you electronically.  All virtual visits are billed to your insurance company just like a traditional visit in the office.    If the connection with a video visit is poor, the visit may have to be switched to a telephone visit.  With either a video or telephone visit, we are not always able to ensure that we have a secure connection.     I need to obtain your verbal consent now.   Are you willing to proceed with your visit today?    Kelly Knight has provided verbal consent on 06/26/2024 for a virtual visit (video or telephone).   Kelly Kitty, FNP  Date: 06/26/2024 9:25 AM  Subjective:     Patient ID: Kelly Knight, male    DOB: [birth year], 28 y.o.   MRN: [id number]  Kelly Knight Kelly Knight, connected with  Kelly Knight  ([id number], [birth year]) on 06/26/24 at  9:20 AM EDT by a video-enabled telemedicine application and verified that I am speaking with the correct person using two identifiers.   Location: Patient: Home  Provider: Virtual Visit Location Provider: Home Office   I discussed the limitations of evaluation and management by telemedicine and the availability of in person appointments. The patient expressed understanding and agreed to proceed.       HPI  Kelly Knight is a 28 y.o. who identifies as a male who was assigned male at birth, and is being seen today for follow up from his dental surgery that he had yesterday.   He has continued antibiotics and is on pain medicine. No fever today but his mouth is throbbing and he is concerned  about working today   He is an established patient with [facility] and has re established his care with Infectious Disease since his most office visit with [facility]. He is actively trying to quit smoking by using the patch and the gum. The first attempt with the patch caused the patient to become shaky and he removed the patch after a few hours. He has been using the gum with some success.    Patient's PHQ 9 was significantly elevated during his visit with ID. At that time he was referred to the [facility] counselor that works with ID but he has not received a call for care yet   He has also been referred to [facility] and has a SW appointment scheduled for 9/25  He has not other concerns today       Objective:     Physical Exam Constitutional:      General: He is not in acute distress.    Appearance: Normal appearance.  HENT:     Nose: Nose normal.     Mouth/Throat:     Mouth: Mucous membranes are moist.     Comments: Slight edema to left cheek without erythema  Pulmonary:     Effort: Pulmonary effort is normal.  Neurological:     Mental Status: He is alert and oriented to  person, place, and time.  Psychiatric:        Mood and Affect: Mood normal.       Assessment & Plan:     1. Tobacco dependence (Primary)  - nicotine  (NICODERM CQ  - DOSED IN MG/24 HR) 7 mg/24hr patch; Place 1 patch (7 mg total) onto the skin daily.  Dispense: 28 patch; Refill: 7  2. Pain, dental Work note provided  Scheduled for physical in November, patent to follow up earlier with any new needs or concerns    Follow Up Instructions: I discussed the assessment and treatment plan with the patient. The patient was provided an opportunity to ask questions and all were answered. The patient agreed with the plan and demonstrated an understanding of the instructions.  A copy of instructions were sent to the patient via MyChart unless otherwise noted below.    The patient was advised to call back or seek an in-person  evaluation if the symptoms worsen or if the condition fails to improve as anticipated.    Kelly Kitty, FNP  **Disclaimer: This note may have been dictated with voice recognition software. Similar sounding words can inadvertently be transcribed and this note may contain transcription errors which may not have been corrected upon publication of note.**

## 2024-06-28 LAB — RPR: RPR Ser Ql: REACTIVE — AB

## 2024-06-28 LAB — HIV-1 INTEGRASE GENOTYPE

## 2024-06-28 LAB — HIV RNA, RTPCR W/R GT (RTI, PI,INT)
HIV 1 RNA Quant: 88100 {copies}/mL — ABNORMAL HIGH
HIV-1 RNA Quant, Log: 4.94 {Log_copies}/mL — ABNORMAL HIGH

## 2024-06-28 LAB — T PALLIDUM AB: T Pallidum Abs: POSITIVE — AB

## 2024-06-28 LAB — HIV-1 GENOTYPE: HIV-1 Genotype: DETECTED — AB

## 2024-06-28 LAB — RPR TITER: RPR Titer: 1:32 {titer} — ABNORMAL HIGH

## 2024-07-03 ENCOUNTER — Other Ambulatory Visit: Payer: Self-pay

## 2024-07-04 ENCOUNTER — Telehealth: Payer: Self-pay | Admitting: Licensed Clinical Social Worker

## 2024-07-04 ENCOUNTER — Encounter: Payer: Self-pay | Admitting: Licensed Clinical Social Worker

## 2024-07-08 ENCOUNTER — Other Ambulatory Visit: Payer: Self-pay

## 2024-07-09 ENCOUNTER — Encounter: Payer: Self-pay | Admitting: Nurse Practitioner

## 2024-07-09 ENCOUNTER — Ambulatory Visit (HOSPITAL_COMMUNITY): Admission: EM | Admit: 2024-07-09 | Discharge: 2024-07-09 | Disposition: A | Discharge status: Home / Self Care

## 2024-07-09 ENCOUNTER — Telehealth: Admitting: Nurse Practitioner

## 2024-07-09 DIAGNOSIS — F339 Major depressive disorder, recurrent, unspecified: Secondary | ICD-10-CM
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
## 2024-07-09 NOTE — Progress Notes (Signed)
 Acute Video Visit    Virtual Visit Consent:   Kelly Knight, you are scheduled for a virtual visit with a [facility] provider today.     Just as with appointments in the office, your consent must be obtained to participate.  Your consent will be active for this visit and any virtual visit you may have with one of our providers in the next 365 days.     If you have a MyChart account, a copy of this consent can be sent to you electronically.  All virtual visits are billed to your insurance company just like a traditional visit in the office.    If the connection with a video visit is poor, the visit may have to be switched to a telephone visit.  With either a video or telephone visit, we are not always able to ensure that we have a secure connection.     I need to obtain your verbal consent now.   Are you willing to proceed with your visit today?    Kelly Knight has provided verbal consent on 07/09/2024 for a virtual visit (video or telephone).   Lauraine Kitty, FNP  Date: 07/09/2024 12:30 PM  Subjective:     Patient ID: Kelly Knight, male    DOB: [birth year], 28 y.o.   MRN: [id number]  [name] Lauraine Kitty, connected with  Kelly Knight  ([id number], Nov 27, 1995) on 07/09/24 at 12:40 PM EDT by a video-enabled telemedicine application and verified that I am speaking with the correct person using two identifiers.   Location: Patient: Home  Provider: Virtual Visit Location Provider: Home Office   I discussed the limitations of evaluation and management by telemedicine and the availability of in person appointments. The patient expressed understanding and agreed to proceed.    No chief complaint on file.   HPI   Kelly Knight is a 28 y.o. who identifies as a male who was assigned male at birth, and is being seen today for worsening depression. At his most recent Infectious Disease appointment his PhQ9 was 25. He was at that time referred to family services for counseling  but has not been contacted for an appointment yet.   He states that since Friday he has been feeling very down, and has now been in bed for the past 24 hours. He has had some feelings of self harm. He has a history of cutting and those are the thoughts that have returned to his thought process.   He feels that when he is on a lot of medicine it weighs on him and he doesn't like it   Today he is feeling especially down and did not have motivation to get out of bed. The patient stated that Everything is piling up and I'm ready to walk away like I normally do.   He recently moved back to [facility] and has been working and getting treatment through infectious disease after he was away for sometime without treatment.   He has been to [facility] in the past and is aware at the process   He is currently staying at [facility] in [place]   Patient is concerned that he has a meeting with HR at his work today and is planning to go to that        07/09/2024   12:34 PM 06/18/2024    1:51 PM 06/06/2024   11:35 AM  PHQ9 SCORE ONLY  PHQ-9 Total Score 25 25 0  Objective:      Physical Exam Pulmonary:     Effort: Pulmonary effort is normal.  Neurological:     General: No focal deficit present.     Mental Status: He is alert and oriented to person, place, and time.  Psychiatric:        Attention and Perception: Attention normal.        Mood and Affect: Affect is flat.        Speech: Speech normal.        Behavior: Behavior is withdrawn.        Thought Content: Thought content normal.        Cognition and Memory: Cognition normal.         Assessment & Plan:   1. Depression, recurrent  Referred to [facility] in [place] [facility] Patient says he can transport himself to the [facility]   Confirmed at 1600 patient has arrived at [facility]     Follow Up Instructions: I discussed the assessment and treatment plan with the patient. The patient was provided an opportunity to ask questions and all were  answered. The patient agreed with the plan and demonstrated an understanding of the instructions.  A copy of instructions were sent to the patient via MyChart unless otherwise noted below.    The patient was advised to call back or seek an in-person evaluation if the symptoms worsen or if the condition fails to improve as anticipated.    Lauraine Kitty, FNP  **Disclaimer: This note may have been dictated with voice recognition software. Similar sounding words can inadvertently be transcribed and this note may contain transcription errors which may not have been corrected upon publication of note.**

## 2024-07-09 NOTE — ED Notes (Signed)
 Pt LWBS after triage per lobby staff

## 2024-07-09 NOTE — Progress Notes (Signed)
   07/09/24 1554  [place] Suicide Severity Rating Scale  1. In the past month -  Have you wished you were dead or wished you could go to sleep and not wake up? Yes  2. In the past month - Have you actually had any thoughts of killing yourself? No  6. Have you ever done anything, started to do anything, or prepared to do anything to end your life? No  C-SSRS RISK CATEGORY Low Risk  Patient location: [facility]gent [facility] Based Crisis Center  [facility] Urgent Care[facility] Crisis [facility] Suicide Precautions Interventions  [facility] Suicide Precautions Interventions Low Risk Interventions

## 2024-07-10 ENCOUNTER — Ambulatory Visit (HOSPITAL_COMMUNITY): Admission: EM | Admit: 2024-07-10 | Discharge: 2024-07-10 | Disposition: A | Discharge status: Home / Self Care

## 2024-07-10 ENCOUNTER — Encounter (HOSPITAL_COMMUNITY): Payer: Self-pay | Admitting: Psychiatry

## 2024-07-10 DIAGNOSIS — F332 Major depressive disorder, recurrent severe without psychotic features: Secondary | ICD-10-CM | POA: Insufficient documentation

## 2024-07-10 DIAGNOSIS — Z62810 Personal history of physical and sexual abuse in childhood: Secondary | ICD-10-CM | POA: Insufficient documentation

## 2024-07-10 DIAGNOSIS — F1090 Alcohol use, unspecified, uncomplicated: Secondary | ICD-10-CM | POA: Insufficient documentation

## 2024-07-10 DIAGNOSIS — Z638 Other specified problems related to primary support group: Secondary | ICD-10-CM | POA: Insufficient documentation

## 2024-07-10 DIAGNOSIS — F419 Anxiety disorder, unspecified: Secondary | ICD-10-CM | POA: Insufficient documentation

## 2024-07-10 DIAGNOSIS — F1290 Cannabis use, unspecified, uncomplicated: Secondary | ICD-10-CM | POA: Insufficient documentation

## 2024-07-10 DIAGNOSIS — Z79899 Other long term (current) drug therapy: Secondary | ICD-10-CM | POA: Insufficient documentation

## 2024-07-10 DIAGNOSIS — Z566 Other physical and mental strain related to work: Secondary | ICD-10-CM | POA: Insufficient documentation

## 2024-07-10 DIAGNOSIS — Z21 Asymptomatic human immunodeficiency virus [HIV] infection status: Secondary | ICD-10-CM | POA: Insufficient documentation
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
## 2024-07-10 NOTE — Discharge Summary (Signed)
 Kelly Knight Kirks to be discharged Home per NP order. An After Visit Summary was printed and given to the patient. Patient escorted out and discharged home via private auto.  [name]  07/10/2024 12:49 PM

## 2024-07-10 NOTE — Discharge Instructions (Signed)
 Assessment/Therapy Walk-Ins will be available on Monday and Wednesday's Only  Monday/Wednesday: 8 AM until slots are full Every 1st and 2nd Friday of the month: 1 pm - 5 pm  For Monday/Wednesday, it is recommended that patients arrive between 7:30 AM and 7:45 AM because patients will be seen in the order of arrival.  Go to the second floor on arrival and check in. For Fridays, it is recommended that patients arrive between 12 noon and 12:30 pm. Go to the second floor on arrival and check in.  **Availability is limited; therefore, patients may not be seen on the same day.**  Psychiatry/Medication Management Walk-Ins will be available Monday-Friday  Monday-Friday: 8 AM to 11 AM.  It is recommended that patients arrive by 7:30 AM to 7:45 AM because patients will be seen in the order of arrival.  Go to the second floor on arrival and check in.  **Availability is limited; therefore, patients may not be seen on the same day.**   ______________________________________________________________________________________________________________________________   [facility]: Outpatient psychiatric Services:   Please see the walk in hours listed below.  Medication Management New Patient needing Medication Management Walk-in, and Existing Patients needing to see a provider for management coming as a walk in   Monday thru Friday 8:00 AM first come first serve until slots are full.  Recommend being there by 7:15 AM to ensure a slot is open.  Therapy New Patient Therapy Intake and Existing Patients needing to see therapist coming in as a walk in.   Monday, Wednesday, and Thursday morning at 8:00 am first come first serve.  Recommend being there by 7:15 AM to ensure a slot is open.    Every 1st, 2nd, and 3rd Friday at 1:00 PM first come first serve until slots are full.  Will still need to come in that morning at 7:15 AM to get registered for an afternoon slot.  For all  walk-ins we ask that you arrive by 7:15 am because patients will be seen in there order of arrival (FIRST COME FIRST SERVE) Availability is limited, therefore you may not be seen on the same day that you walk in if all slots are full.    Our goal is to serve and meet the needs of our community to the best of our ability.

## 2024-07-11 ENCOUNTER — Telehealth: Payer: Self-pay

## 2024-07-11 NOTE — Telephone Encounter (Signed)
 Kelly Knight called to request an appointment with [facility] counselor. Discussed that since he has Medicaid he will be able to see Caitlyn in the office for his initial appointment and will then follow up with her at [facility]. He is agreeable to this. Reports he's had to receive urgent mental health services over the last few days and is trying to get a more long-term plan in place.   Kelly Knight, BSN, RN

## 2024-07-15 ENCOUNTER — Ambulatory Visit: Admitting: Nurse Practitioner

## 2024-07-15 ENCOUNTER — Telehealth: Admitting: Nurse Practitioner

## 2024-07-15 ENCOUNTER — Encounter: Payer: Self-pay | Admitting: Nurse Practitioner

## 2024-07-15 ENCOUNTER — Encounter: Payer: Self-pay | Admitting: Licensed Clinical Social Worker

## 2024-07-15 DIAGNOSIS — F419 Anxiety disorder, unspecified: Secondary | ICD-10-CM

## 2024-07-15 DIAGNOSIS — F339 Major depressive disorder, recurrent, unspecified: Secondary | ICD-10-CM | POA: Diagnosis not present

## 2024-07-15 DIAGNOSIS — F4320 Adjustment disorder, unspecified: Secondary | ICD-10-CM | POA: Diagnosis not present

## 2024-07-15 NOTE — Progress Notes (Signed)
 Acute Video Visit    Virtual Visit Consent:   Kelly Knight, you are scheduled for a virtual visit with a [facility] provider today.     Just as with appointments in the office, your consent must be obtained to participate.  Your consent will be active for this visit and any virtual visit you may have with one of our providers in the next 365 days.     If you have a MyChart account, a copy of this consent can be sent to you electronically.  All virtual visits are billed to your insurance company just like a traditional visit in the office.    If the connection with a video visit is poor, the visit may have to be switched to a telephone visit.  With either a video or telephone visit, we are not always able to ensure that we have a secure connection.     I need to obtain your verbal consent now.   Are you willing to proceed with your visit today?    Kelly Knight has provided verbal consent on 07/15/2024 for a virtual visit (video or telephone).   Kelly Kitty, FNP  Date: 07/15/2024 1:53 PM  Subjective:     Patient ID: Kelly Knight, male    DOB: 04-28-1996, 28 y.o.   MRN: [id number]  Kelly Knight Kelly Knight, connected with  Kelly Knight  ([id number], 09/16/1996) on 07/15/24 at  2:00 PM EDT by a video-enabled telemedicine application and verified that I am speaking with the correct person using two identifiers.   Location: Patient: Home  Provider: Virtual Visit Location Provider: Home Office   I discussed the limitations of evaluation and management by telemedicine and the availability of in person appointments. The patient expressed understanding and agreed to proceed.     HPI  Kelly Knight is a 28 y.o. who identifies as a male who was assigned male at birth, and is being seen today for follow up after recent [facility] visit. See most recent visit when patient was sent for in patient evaluation due to thoughts of self harm.    He has not been scheduled with outpatient BH  yet - no upcoming appointments scheduled   His biggest struggle right now is an inability to sleep, or work due to compiling anxiety. He also feels that his anxiety is preventing him from eating.   He is no established with ID and has been compliant restarting his HIV meds.   Accomodation to work between the hours   Feels when he is alone in his thoughts for too long it is a distraction and an open door to thoughts of self harm and depression. It is hard for him to get motivated. He feels that he is best in the morning and does best when he can get up and go somewhere and not have as much down time.   He also has a difficult time working later into the evening when his medications are due and he needs to eat/ missing meals is difficult for his medication regimen.   After speaking to the [facility] staff last week they agreed that his idea time for working to avoid am downtime triggers and evening medication needs would be 8-4:30pm.  He may require time off for appointments to assure BH needs are met   He has also had some personal issues with the evening supervision in not supporting his needs and was asked to submit a voluntary resignation and this has  been addressed internally, but would also be a motivation for the patient to not be on pm shift.      Objective:     Physical Exam Constitutional:      General: He is not in acute distress.    Appearance: Normal appearance. He is not ill-appearing.  HENT:     Nose: Nose normal.  Pulmonary:     Effort: Pulmonary effort is normal.  Neurological:     Mental Status: He is alert and oriented to person, place, and time.  Psychiatric:        Mood and Affect: Mood is depressed.        Behavior: Behavior normal.        Thought Content: Thought content normal.        Judgment: Judgment normal.          Assessment & Plan:   1. Anxiety (Primary) - Ambulatory referral to Psychiatry  2. Depression, recurrent - Ambulatory referral to  Psychiatry  3. Adjustment disorder, unspecified type - Ambulatory referral to Psychiatry    Follow Up Instructions: I discussed the assessment and treatment plan with the patient. The patient was provided an opportunity to ask questions and all were answered. The patient agreed with the plan and demonstrated an understanding of the instructions.  A copy of instructions were sent to the patient via MyChart unless otherwise noted below.    The patient was advised to call back or seek an in-person evaluation if the symptoms worsen or if the condition fails to improve as anticipated.    Kelly Kitty, FNP  **Disclaimer: This note may have been dictated with voice recognition software. Similar sounding words can inadvertently be transcribed and this note may contain transcription errors which may not have been corrected upon publication of note.**

## 2024-07-16 ENCOUNTER — Encounter (HOSPITAL_COMMUNITY): Payer: Self-pay | Admitting: Psychiatry

## 2024-07-16 ENCOUNTER — Ambulatory Visit (INDEPENDENT_AMBULATORY_CARE_PROVIDER_SITE_OTHER): Admitting: Psychiatry

## 2024-07-16 ENCOUNTER — Other Ambulatory Visit: Payer: Self-pay

## 2024-07-16 VITALS — BP 122/72 | HR 75 | Temp 98.2°F | Ht 70.0 in | Wt 137.4 lb

## 2024-07-16 DIAGNOSIS — F5105 Insomnia due to other mental disorder: Secondary | ICD-10-CM | POA: Diagnosis not present

## 2024-07-16 DIAGNOSIS — F99 Mental disorder, not otherwise specified: Secondary | ICD-10-CM

## 2024-07-16 DIAGNOSIS — F332 Major depressive disorder, recurrent severe without psychotic features: Secondary | ICD-10-CM

## 2024-07-16 DIAGNOSIS — F4310 Post-traumatic stress disorder, unspecified: Secondary | ICD-10-CM

## 2024-07-16 DIAGNOSIS — F17200 Nicotine dependence, unspecified, uncomplicated: Secondary | ICD-10-CM

## 2024-07-16 DIAGNOSIS — F1220 Cannabis dependence, uncomplicated: Secondary | ICD-10-CM

## 2024-07-16 MED ORDER — NICOTINE POLACRILEX 4 MG MT GUM
4.0000 mg | CHEWING_GUM | OROMUCOSAL | 3 refills | Status: DC | PRN
  Filled 2024-07-16: qty 100, 20d supply, fill #0

## 2024-07-16 MED ORDER — QUETIAPINE FUMARATE 25 MG PO TABS
25.0000 mg | ORAL_TABLET | Freq: Every evening | ORAL | 3 refills | Status: DC | PRN
  Filled 2024-07-16 – 2024-07-30 (×2): qty 60, 30d supply, fill #0

## 2024-07-16 MED ORDER — NICOTINE 14 MG/24HR TD PT24
14.0000 mg | MEDICATED_PATCH | Freq: Every day | TRANSDERMAL | 3 refills | Status: DC
  Filled 2024-07-16 – 2024-07-30 (×2): qty 28, 28d supply, fill #0

## 2024-07-16 MED ORDER — MIRTAZAPINE 30 MG PO TABS
30.0000 mg | ORAL_TABLET | Freq: Every day | ORAL | 3 refills | Status: DC
  Filled 2024-07-16 – 2024-07-30 (×2): qty 30, 30d supply, fill #0
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
## 2024-07-17 ENCOUNTER — Encounter: Payer: Self-pay | Admitting: Licensed Clinical Social Worker

## 2024-07-18 ENCOUNTER — Telehealth: Admitting: Nurse Practitioner

## 2024-07-18 VITALS — BP 109/60 | HR 63 | Resp 14 | Ht 68.0 in | Wt 133.8 lb

## 2024-07-18 DIAGNOSIS — F339 Major depressive disorder, recurrent, unspecified: Secondary | ICD-10-CM

## 2024-07-18 DIAGNOSIS — F419 Anxiety disorder, unspecified: Secondary | ICD-10-CM | POA: Diagnosis not present

## 2024-07-18 DIAGNOSIS — F4310 Post-traumatic stress disorder, unspecified: Secondary | ICD-10-CM | POA: Diagnosis not present

## 2024-07-18 NOTE — Progress Notes (Signed)
 Pt presents for completion of accomodation forms

## 2024-07-18 NOTE — Progress Notes (Signed)
 Acute Office Visit  Virtual Visit Consent:   Kelly Knight, you are scheduled for a virtual visit with a [facility] provider today.     Just as with appointments in the office, your consent must be obtained to participate.  Your consent will be active for this visit and any virtual visit you may have with one of our providers in the next 365 days.     If you have a MyChart account, a copy of this consent can be sent to you electronically.  All virtual visits are billed to your insurance company just like a traditional visit in the office.    If the connection with a video visit is poor, the visit may have to be switched to a telephone visit.  With either a video or telephone visit, we are not always able to ensure that we have a secure connection.     I need to obtain your verbal consent now.   Are you willing to proceed with your visit today?    Kelly Knight has provided verbal consent on 07/18/2024 for a virtual visit (video or telephone).   Lauraine Kitty, FNP  Date: 07/18/2024 12:34 PM  Subjective:     Patient ID: Kelly Knight, male    DOB: 06-14-1996, 28 y.o.   MRN: [id number]  Kelly Knight Lauraine Kitty, connected with  Kelly Knight  ([id number], Dec 04, 1995) on 07/18/24 at 11:40 AM EDT by a video-enabled telemedicine application and verified that I am speaking with the correct person using two identifiers.  Kelly Knight , present for entirety of visit to assist with video functionality and physical examination via [employer] device.  Location: Patient: Kelly Knight Provider: Virtual Visit Location Provider: Home Office   I discussed the limitations of evaluation and management by telemedicine and the availability of in person appointments. The patient expressed understanding and agreed to proceed.    Chief Complaint  Patient presents with   Follow-up    HPI  Kelly Knight is a 28 y.o. who identifies as a male who was assigned male at birth, and  is being seen today for assistance in form completion   See notes from yesterday      Objective:    BP 109/60 (BP Location: Left Arm, Patient Position: Sitting, Cuff Size: Normal)   Pulse 63   Resp 14   Ht 5' 8 (1.727 m)   Wt 133 lb 12.8 oz (60.7 kg)   SpO2 93%   BMI 20.34 kg/m    Physical Exam Constitutional:      General: He is not in acute distress.    Appearance: Normal appearance. He is not ill-appearing.  Neurological:     Mental Status: He is alert.  Psychiatric:        Attention and Perception: Attention normal.        Mood and Affect: Affect is flat.        Speech: Speech normal.        Behavior: Behavior normal.        Thought Content: Thought content normal.        Cognition and Memory: Cognition normal.        Judgment: Judgment normal.     No results found for any visits on 07/18/24.      Assessment & Plan:   Anxiety  Depression, recurrent  PTSD (post-traumatic stress disorder)  Form completion for accommodations at work   Follow Up Instructions: I discussed the assessment and treatment plan  with the patient. The patient was provided an opportunity to ask questions and all were answered. The patient agreed with the plan and demonstrated an understanding of the instructions.  A copy of instructions were sent to the patient via MyChart unless otherwise noted below.    The patient was advised to call back or seek an in-person evaluation if the symptoms worsen or if the condition fails to improve as anticipated.    Lauraine Kitty, FNP  **Disclaimer: This note may have been dictated with voice recognition software. Similar sounding words can inadvertently be transcribed and this note may contain transcription errors which may not have been corrected upon publication of note.**

## 2024-07-23 ENCOUNTER — Other Ambulatory Visit: Payer: Self-pay

## 2024-07-24 ENCOUNTER — Other Ambulatory Visit: Payer: Self-pay

## 2024-07-24 ENCOUNTER — Ambulatory Visit: Admitting: Licensed Clinical Social Worker

## 2024-07-25 ENCOUNTER — Other Ambulatory Visit: Payer: Self-pay

## 2024-07-29 ENCOUNTER — Other Ambulatory Visit: Payer: Self-pay

## 2024-07-30 ENCOUNTER — Ambulatory Visit: Admitting: Infectious Diseases

## 2024-07-30 ENCOUNTER — Other Ambulatory Visit: Payer: Self-pay

## 2024-08-02 NOTE — Progress Notes (Addendum)
 The patient attended virtual primary clinic on 06/17/2024 where his BP screening results was 111/72. At the [facility] the patient noted he has insurance and does not smoke. Patient did not have any SDOH insecurities. Pt listed pcp as Lauraine Kitty, FNP. Per chart review pt has a pcp and the last office visit was 07/18/2024 for anxiety. The pt BP was 109/60 on 07/18/2024. According to chart pt was advised to call back or seek an in-person evaluation if the symptoms worsen or the the condition fails to improve as anticipated by pcp on 07/18/2024. Chart review also indicates a future appt with pcp on 08/19/2024. No additional Health equity team support indicated at this time.

## 2024-08-07 ENCOUNTER — Other Ambulatory Visit: Payer: Self-pay

## 2024-08-08 ENCOUNTER — Other Ambulatory Visit: Payer: Self-pay

## 2024-08-15 ENCOUNTER — Encounter (HOSPITAL_COMMUNITY): Payer: Self-pay | Admitting: Psychiatry

## 2024-08-15 ENCOUNTER — Other Ambulatory Visit: Payer: Self-pay

## 2024-08-15 ENCOUNTER — Telehealth (INDEPENDENT_AMBULATORY_CARE_PROVIDER_SITE_OTHER): Admitting: Psychiatry

## 2024-08-15 DIAGNOSIS — F17200 Nicotine dependence, unspecified, uncomplicated: Secondary | ICD-10-CM

## 2024-08-15 DIAGNOSIS — F32A Depression, unspecified: Secondary | ICD-10-CM

## 2024-08-15 DIAGNOSIS — F332 Major depressive disorder, recurrent severe without psychotic features: Secondary | ICD-10-CM

## 2024-08-15 DIAGNOSIS — F5105 Insomnia due to other mental disorder: Secondary | ICD-10-CM | POA: Diagnosis not present

## 2024-08-15 DIAGNOSIS — F99 Mental disorder, not otherwise specified: Secondary | ICD-10-CM

## 2024-08-15 DIAGNOSIS — F419 Anxiety disorder, unspecified: Secondary | ICD-10-CM | POA: Diagnosis not present

## 2024-08-15 MED ORDER — MIRTAZAPINE 30 MG PO TABS
30.0000 mg | ORAL_TABLET | Freq: Every day | ORAL | 3 refills | Status: AC
Start: 2024-08-15 — End: ?
  Filled 2024-08-15: qty 30, 30d supply, fill #0

## 2024-08-15 MED ORDER — NICOTINE POLACRILEX 4 MG MT GUM
4.0000 mg | CHEWING_GUM | OROMUCOSAL | 3 refills | Status: AC | PRN
  Filled 2024-08-15: qty 100, 20d supply, fill #0

## 2024-08-15 MED ORDER — HYDROXYZINE HCL 25 MG PO TABS
25.0000 mg | ORAL_TABLET | Freq: Three times a day (TID) | ORAL | 1 refills | Status: AC | PRN
  Filled 2024-08-15: qty 90, 30d supply, fill #0

## 2024-08-15 MED ORDER — NICOTINE 14 MG/24HR TD PT24
14.0000 mg | MEDICATED_PATCH | Freq: Every day | TRANSDERMAL | 3 refills | Status: AC
  Filled 2024-08-15: qty 28, 28d supply, fill #0

## 2024-08-15 MED ORDER — QUETIAPINE FUMARATE 25 MG PO TABS
25.0000 mg | ORAL_TABLET | Freq: Every evening | ORAL | 3 refills | Status: AC | PRN
  Filled 2024-08-15: qty 60, 30d supply, fill #0

## 2024-08-15 NOTE — Progress Notes (Signed)
 BH MD /PA/NP OP Progress Note Virtual Visit via Video Note  I connected with Kelly Knight on 08/15/24 at  1:00 PM EST by a video enabled telemedicine application and verified that I am speaking with the correct person using two identifiers.  Location: Patient: Home Provider: Clinic   I discussed the limitations of evaluation and management by telemedicine and the availability of in person appointments. The patient expressed understanding and agreed to proceed.  I provided 30 minutes of non-face-to-face time during this encounter.    08/15/2024 3:13 PM Kelly Knight  MRN:  [id number]  Chief Complaint: Kelly Knight was hard HPI:  28 year old male seen today for follow up psychiatric evaluation. He has a psychiatric history of tobacco dependence, anxiety, depression adjustment disorder.  He is currently managed on NicoDerm 14 mg patches, Nicorette  4 mg gum, hydroxyzine  25 mg 3 times daily as needed, and mirtazapine  30 mg. He reports that his medications are effective in managing his psychiatric conditions.    Today he was well-groomed, pleasant, cooperative, and engaged in conversation.  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  He notes that his supervisor at times gets under the skin.  He notes that he lurks and tries to find things to say that Mr. Jodey is doing incorrectly.  He notes that he left because he was overly anxious. Patient does note that he recently got accommodations at work to work from [employer]. He looks forward to this change.  Mentally patient notes that this week his mood continues to fluctuates but does note that he has seen improvements. Provider asked patient if he continues to smoke marijuana.  Patient reports that he has cut back his consumption.  He reports that he had a conversation with his pastor who encouraged him to reduce his consumption, take his medication, and focus on serving on the choir.  Patient notes that this motivated  him.  He also reports that his mother has been a tremendous support in this process.   Since his last visit he notes that his anxiety and depression has somewhat improved.  Today provider conducted GAD-7 and patient 12, at his last visit he scored a 21.  Provider also endorsed that a PHQ-9 and he scored a 12, at his last visit he scored a 25.  He endorses a better sleep since his last visit.  He also notes that his appetite has improved.  Today he denies SI/HI/AVH, mania, paranoia.     Overall patient notes that he has seen an improvement in his mental health.  No medication changes made today.  Patient reports taking medication as prescribed.  No other concerns are noted at this time. Visit Diagnosis:    ICD-10-CM   1. Severe episode of recurrent major depressive disorder, without psychotic features (HCC)  F33.2 hydrOXYzine  (ATARAX ) 25 MG tablet    QUEtiapine (SEROQUEL) 25 MG tablet    mirtazapine  (REMERON ) 30 MG tablet    2. Anxiety and depression  F41.9 hydrOXYzine  (ATARAX ) 25 MG tablet   F32.A     3. Insomnia due to other mental disorder  F51.05 QUEtiapine (SEROQUEL) 25 MG tablet   F99 mirtazapine  (REMERON ) 30 MG tablet    4. Tobacco dependence  F17.200 nicotine  polacrilex (NICORETTE ) 4 MG gum    nicotine  (NICODERM CQ  - DOSED IN MG/24 HOURS) 14 mg/24hr patch      Past Psychiatric History: tobacco dependence, anxiety, depression adjustment disorder Past Medical History:  Past Medical History:  Diagnosis Date  Asthma    Heart murmur    HIV (human immunodeficiency virus infection) (HCC) 01/23/2019   Situs inversus    Syphilis 01/23/2019   History reviewed. No pertinent surgical history.  Family Psychiatric History: Mother anxiety in father and diagnosed mental health issues   Family History:  Family History  Problem Relation Age of Onset   Diabetes Mother    Hypertension Mother    Hypertension Father     Social History:  Social History   Socioeconomic History   Marital  status: Single    Spouse name: Not on file   Number of children: Not on file   Years of education: Not on file   Highest education level: Not on file  Occupational History   Not on file  Tobacco Use   Smoking status: Former    Types: Cigarettes    Passive exposure: Never   Smokeless tobacco: Never   Tobacco comments:    Stopped vaping   Vaping Use   Vaping status: Former   Substances: THC  Substance and Sexual Activity   Alcohol use: Not Currently    Comment: socially    Drug use: Not Currently    Frequency: 7.0 times per week    Types: Marijuana   Sexual activity: Not Currently    Partners: Male    Comment: declined condoms  Other Topics Concern   Not on file  Social History Narrative   Not on file   Social Drivers of Health   Financial Resource Strain: Not on file  Food Insecurity: Not on file  Transportation Needs: Not on file  Physical Activity: Not on file  Stress: Not on file  Social Connections: Not on file    Allergies:  Allergies  Allergen Reactions   Shrimp [Shellfish Allergy] Itching and Swelling    Throat swelling   Sulfa Antibiotics Rash    Metabolic Disorder Labs: Lab Results  Component Value Date   HGBA1C 5.5 06/06/2024   No results found for: PROLACTIN Lab Results  Component Value Date   CHOL 126 06/06/2024   TRIG 57 06/06/2024   HDL 38 (L) 06/06/2024   CHOLHDL 3.3 06/06/2024   LDLCALC 76 06/06/2024   LDLCALC 84 01/17/2019   No results found for: TSH  Therapeutic Level Labs: No results found for: LITHIUM No results found for: VALPROATE No results found for: CBMZ  Current Medications: Current Outpatient Medications  Medication Sig Dispense Refill   acetaminophen  (TYLENOL ) 325 MG tablet Take 2 tablets (650 mg total) by mouth every 6 (six) hours as needed. 100 tablet 0   albuterol  (PROVENTIL ) (2.5 MG/3ML) 0.083% nebulizer solution Take 2.5 mg by nebulization every 6 (six) hours as needed for wheezing.     albuterol   (VENTOLIN  HFA) 108 (90 Base) MCG/ACT inhaler Inhale 2 puffs into the lungs every 6 (six) hours as needed for wheezing or shortness of breath. 8 g 1   albuterol  (VENTOLIN  HFA) 108 (90 Base) MCG/ACT inhaler Inhale 2 puffs into the lungs every 6 (six) hours as needed for wheezing or shortness of breath. 18 g 0   atovaquone  (MEPRON ) 750 MG/5ML suspension Take 10 mLs (1,500 mg total) by mouth daily with breakfast. 300 mL 2   dolutegravir  (TIVICAY ) 50 MG tablet Take 1 tablet (50 mg total) by mouth daily after supper. 30 tablet 11   dolutegravir  (TIVICAY ) 50 MG tablet Take 1 tablet (50 mg total) by mouth daily. 30 tablet 6   emtricitabine -tenofovir  AF (DESCOVY ) 200-25 MG tablet Take 1 tablet  by mouth daily after supper. 30 tablet 6   famotidine  (PEPCID ) 20 MG tablet Take 1 tablet (20 mg total) by mouth 2 (two) times daily for 7 days, THEN 1 tablet (20 mg total) 2 (two) times daily as needed. 60 tablet 2   hydrOXYzine  (ATARAX ) 25 MG tablet Take 1 tablet (25 mg total) by mouth 3 (three) times daily as needed for anxiety. 90 tablet 1   ibuprofen  (ADVIL ) 600 MG tablet Take 1 tablet (600 mg total) by mouth every 6 (six) hours as needed for moderate pain (pain score 4-6). (Patient not taking: Reported on 06/18/2024) 60 tablet 0   lidocaine  (XYLOCAINE ) 2 % solution Use as directed 15 mLs in the mouth or throat as needed for mouth pain. 100 mL 0   mirtazapine  (REMERON ) 30 MG tablet Take 1 tablet (30 mg total) by mouth at bedtime. 30 tablet 3   nicotine  (NICODERM CQ  - DOSED IN MG/24 HOURS) 14 mg/24hr patch Place 1 patch (14 mg total) onto the skin daily. 28 patch 3   nicotine  polacrilex (NICORETTE ) 4 MG gum Take 1 each (4 mg total) by mouth as needed for smoking cessation. 100 tablet 3   omeprazole  (PRILOSEC) 40 MG capsule Take 1 capsule (40 mg total) by mouth daily 30 minutes before breakfast AND 1 capsule (40 mg total) daily 30 minutes before supper. 60 capsule 1   ondansetron  (ZOFRAN -ODT) 4 MG disintegrating tablet  Take 1 tablet (4 mg total) by mouth every 8 (eight) hours as needed. 20 tablet 0   QUEtiapine (SEROQUEL) 25 MG tablet Take 1-2 tablets (25-50 mg total) by mouth at bedtime as needed. 60 tablet 3   No current facility-administered medications for this visit.     Musculoskeletal: Strength & Muscle Tone: within normal limits and Telehealth visit Gait & Station: normal, Telehealth visit Patient leans: N/A  Psychiatric Specialty Exam: Review of Systems  There were no vitals taken for this visit.There is no height or weight on file to calculate BMI.  General Appearance: Well Groomed  Eye Contact:  Good  Speech:  Clear and Coherent and Normal Rate  Volume:  Normal  Mood:  Euthymic  Affect:  Appropriate and Congruent  Thought Process:  Coherent, Goal Directed, and Linear  Orientation:  Full (Time, Place, and Person)  Thought Content: WDL and Logical   Suicidal Thoughts:  Yes.  without intent/plan  Homicidal Thoughts:  No  Memory:  Immediate;   Good Recent;   Good Remote;   Good  Judgement:  Good  Insight:  Good  Psychomotor Activity:  Normal  Concentration:  Concentration: Good and Attention Span: Good  Recall:  Good  Fund of Knowledge: Good  Language: Good  Akathisia:  No  Handed:  Right  AIMS (if indicated): not done  Assets:  Communication Skills Desire for Improvement Financial Resources/Insurance Housing Leisure Time Physical Health Resilience Social Support Transportation  ADL's:  Intact  Cognition: WNL  Sleep:  Good   Screenings: GAD-7    Flowsheet Row Video Visit from 08/15/2024 in [facility] Telemedicine from 07/18/2024 in [facility] Primary Care Office Visit from 07/16/2024 in [facility] Office Visit from 06/18/2024 in [facility] [place] Office Visit from 11/21/2019 in [facility] [place]   Total GAD-7 Score 12 21 21 20 15    PHQ2-9  Flowsheet Row Video Visit from 08/15/2024 in [facility] Telemedicine from 07/18/2024 in [facility] Care Office Visit from 07/16/2024 in [facility] Video Visit from 07/09/2024 in [facility] Virtual Primary Care Office Visit from 06/18/2024 in [facility] [place]  PHQ-2 Total Score 4 6 6 6 6   PHQ-9 Total Score 12 27 26 25 25    Flowsheet Row Video Visit from 08/15/2024 in [facility] Telemedicine from 07/18/2024 in [facility] Virtual Primary Care Office Visit from 07/16/2024 in [facility]  C-SSRS RISK CATEGORY Error: Q7 should not be populated when Q6 is No Moderate Risk Error: Q7 should not be populated when Q6 is No     Assessment and Plan: Patient notes that his mood, anxiety, and sleep has improved. He notes that at times he has mild anxiety but is able to cope with it. Patient notes that he he continues to pray and rely on his support system (mother, church family, and pastor). No medication changes made today. Patient agreeable to continue medications as prescribed.   1. Severe episode of recurrent major depressive disorder, without psychotic features (HCC)  Continue- hydrOXYzine  (ATARAX ) 25 MG tablet; Take 1 tablet (25 mg total) by mouth 3 (three) times daily as needed for anxiety.  Dispense: 90 tablet; Refill: 1 Continue- QUEtiapine (SEROQUEL) 25 MG tablet; Take 1-2 tablets (25-50 mg total) by mouth at bedtime as needed.  Dispense: 60 tablet; Refill: 3 Continue- mirtazapine  (REMERON ) 30 MG tablet; Take 1 tablet (30 mg total) by mouth at bedtime.  Dispense: 30 tablet; Refill: 3  2. Anxiety and depression  Continue- hydrOXYzine  (ATARAX ) 25 MG tablet; Take 1 tablet (25 mg total) by mouth 3 (three) times daily as needed for anxiety.  Dispense: 90 tablet; Refill:  1  3. Insomnia due to other mental disorder  Continue- QUEtiapine (SEROQUEL) 25 MG tablet; Take 1-2 tablets (25-50 mg total) by mouth at bedtime as needed.  Dispense: 60 tablet; Refill: 3 Continue- mirtazapine  (REMERON ) 30 MG tablet; Take 1 tablet (30 mg total) by mouth at bedtime.  Dispense: 30 tablet; Refill: 3  4. Tobacco dependence  Continue- nicotine  polacrilex (NICORETTE ) 4 MG gum; Take 1 each (4 mg total) by mouth as needed for smoking cessation.  Dispense: 100 tablet; Refill: 3 Continue- nicotine  (NICODERM CQ  - DOSED IN MG/24 HOURS) 14 mg/24hr patch; Place 1 patch (14 mg total) onto the skin daily.  Dispense: 28 patch; Refill: 3     Collaboration of Care: Collaboration of Care: Other provider involved in patient's care AEB PCP and counselor  Patient/Guardian was advised Release of Information must be obtained prior to any record release in order to collaborate their care with an outside provider. Patient/Guardian was advised if they have not already done so to contact the registration department to sign all necessary forms in order for us  to release information regarding their care.   Follow up in 2 months  Consent: Patient/Guardian gives verbal consent for treatment and assignment of benefits for services provided during this visit. Patient/Guardian expressed understanding and agreed to proceed.   Follow up in 3 months Zane FORBES Bach, NP 08/15/2024, 3:13 PM

## 2024-08-19 ENCOUNTER — Ambulatory Visit: Admitting: Nurse Practitioner

## 2024-08-19 ENCOUNTER — Ambulatory Visit (INDEPENDENT_AMBULATORY_CARE_PROVIDER_SITE_OTHER): Admitting: Nurse Practitioner

## 2024-08-19 VITALS — BP 107/70 | HR 71 | Resp 16 | Wt 135.0 lb

## 2024-08-19 DIAGNOSIS — Z0000 Encounter for general adult medical examination without abnormal findings: Secondary | ICD-10-CM

## 2024-08-19 NOTE — Progress Notes (Signed)
 The patient presented for a primary care visit on 08/19/2024. Blood pressure screening was conducted, and the result was (BP). During the appointment, the patient reported a food and housing SDOH need. As a precaution, the patient was asked if he needed resources for his SDOH needs. Pt requested housing resources only for this visit.  A review of the patient's chart revealed that they do not currently have a primary care provider Kelly Knight) and no future appointments were indicated.   An additional follow up will be done according to the health equity team's protocol.

## 2024-08-19 NOTE — Progress Notes (Signed)
Pt presents for annual physical exam no other concerns

## 2024-08-19 NOTE — Progress Notes (Signed)
 Annual Physical Exam   Location: Patient: in office  Provider: In office     Patient: Kelly Knight   DOB: 19-Mar-1996   28 y.o. Male  MRN: [id number]  Subjective:    Chief Complaint  Patient presents with   Annual Exam    Kelly Knight is a 28 y.o. male who presents today for a complete physical exam. He reports consuming a general diet. The patient does not participate in regular exercise at present. He generally feels fairly well. He reports sleeping fairly well. He does not have additional problems to discuss today.   He is doing much better today he is using hydroxyzine  three times daily and Seroquel for sleep. He is now being seen at [facility] has follow up in December   When he was initially diagnosed with GERD he was following a GERD friendly diet that he has fallen off of. He felt he was able to gain weight while following that diet.        08/19/2024    1:14 PM 08/15/2024    1:12 PM 07/18/2024   12:00 PM 07/16/2024   10:53 AM 07/09/2024   12:34 PM  Depression screen PHQ 2/9  Decreased Interest 2  3  3   Down, Depressed, Hopeless 2  3  3   PHQ - 2 Score 4  6  6   Altered sleeping 1  3  3   Tired, decreased energy 1  3  3   Change in appetite 1  3  3   Feeling bad or failure about yourself  2  3  3   Trouble concentrating 1  3  3   Moving slowly or fidgety/restless 1  3  3   Suicidal thoughts 1  3  1   PHQ-9 Score 12  27   25    Difficult doing work/chores Very difficult  Extremely dIfficult  Extremely dIfficult     Information is confidential and restricted. Go to Review Flowsheets to unlock data.   Data saved with a previous flowsheet row definition       08/15/2024    1:14 PM 07/18/2024   12:01 PM 07/16/2024   10:52 AM 06/18/2024    1:51 PM  GAD 7 : Generalized Anxiety Score  Nervous, Anxious, on Edge  3  3  Control/stop worrying  3  3  Worry too much - different things  3  3  Trouble relaxing  3  2  Restless  3  3  Easily  annoyed or irritable  3  3  Afraid - awful might happen  3  3  Total GAD 7 Score  21  20  Anxiety Difficulty  Extremely difficult  Extremely difficult     Information is confidential and restricted. Go to Review Flowsheets to unlock data.       Most recent fall risk assessment:    06/18/2024    1:50 PM  Fall Risk   Falls in the past year? 0  Number falls in past yr: 0  Injury with Fall? 0  Risk for fall due to : No Fall Risks  Follow up Falls evaluation completed     Most recent depression screenings:    08/19/2024    1:14 PM 08/15/2024    1:12 PM  PHQ  2/9 Scores  PHQ - 2 Score 4   PHQ- 9 Score 12      Information is confidential and restricted. Go to Review Flowsheets to unlock data.    Vision:Not within last year , Dental: Current dental problems and Receives regular dental care, and STD: screenings with ID   Patient Active Problem List   Diagnosis Date Noted   Adjustment disorder with mixed anxiety and depressed mood 08/16/2022   Uncomplicated asthma 10/29/2021   Situs inversus abdominalis 05/17/2021   GERD with esophagitis 01/21/2021   Environmental allergies 07/05/2020   Anxiety and depression 05/10/2019   HIV (human immunodeficiency virus infection) (HCC) 01/23/2019   History of syphilis 01/23/2019   Past Medical History:  Diagnosis Date   Asthma    Heart murmur    HIV (human immunodeficiency virus infection) (HCC) 01/23/2019   Situs inversus    Syphilis 01/23/2019   No past surgical history on file. Social History   Tobacco Use   Smoking status: Former    Types: Cigarettes    Passive exposure: Never   Smokeless tobacco: Never   Tobacco comments:    Stopped vaping   Vaping Use   Vaping status: Former   Substances: THC  Substance Use Topics   Alcohol use: Not Currently    Comment: socially    Drug use: Not Currently    Frequency: 7.0 times per week    Types: Marijuana      Patient Care Team: Kennyth Domino, FNP as PCP - General (Nurse  Practitioner) Lionell Jon DEL, [facility] (Pharmacist)   Outpatient Medications Prior to Visit  Medication Sig   acetaminophen  (TYLENOL ) 325 MG tablet Take 2 tablets (650 mg total) by mouth every 6 (six) hours as needed.   albuterol  (PROVENTIL ) (2.5 MG/3ML) 0.083% nebulizer solution Take 2.5 mg by nebulization every 6 (six) hours as needed for wheezing.   albuterol  (VENTOLIN  HFA) 108 (90 Base) MCG/ACT inhaler Inhale 2 puffs into the lungs every 6 (six) hours as needed for wheezing or shortness of breath.   albuterol  (VENTOLIN  HFA) 108 (90 Base) MCG/ACT inhaler Inhale 2 puffs into the lungs every 6 (six) hours as needed for wheezing or shortness of breath.   atovaquone  (MEPRON ) 750 MG/5ML suspension Take 10 mLs (1,500 mg total) by mouth daily with breakfast.   dolutegravir  (TIVICAY ) 50 MG tablet Take 1 tablet (50 mg total) by mouth daily after supper.   dolutegravir  (TIVICAY ) 50 MG tablet Take 1 tablet (50 mg total) by mouth daily.   emtricitabine -tenofovir  AF (DESCOVY ) 200-25 MG tablet Take 1 tablet by mouth daily after supper.   famotidine  (PEPCID ) 20 MG tablet Take 1 tablet (20 mg total) by mouth 2 (two) times daily for 7 days, THEN 1 tablet (20 mg total) 2 (two) times daily as needed.   hydrOXYzine  (ATARAX ) 25 MG tablet Take 1 tablet (25 mg total) by mouth 3 (three) times daily as needed for anxiety.   ibuprofen  (ADVIL ) 600 MG tablet Take 1 tablet (600 mg total) by mouth every 6 (six) hours as needed for moderate pain (pain score 4-6). (Patient not taking: Reported on 06/18/2024)   lidocaine  (XYLOCAINE ) 2 % solution Use as directed 15 mLs in the mouth or throat as needed for mouth pain.   mirtazapine  (REMERON ) 30 MG tablet Take 1 tablet (30 mg total) by mouth at bedtime.   nicotine  (NICODERM CQ  - DOSED IN MG/24 HOURS) 14 mg/24hr patch Place 1 patch (14 mg total) onto the skin daily.   nicotine   polacrilex (NICORETTE ) 4 MG gum Take 1 each (4 mg total) by mouth as needed for smoking cessation.    omeprazole  (PRILOSEC) 40 MG capsule Take 1 capsule (40 mg total) by mouth daily 30 minutes before breakfast AND 1 capsule (40 mg total) daily 30 minutes before supper.   ondansetron  (ZOFRAN -ODT) 4 MG disintegrating tablet Take 1 tablet (4 mg total) by mouth every 8 (eight) hours as needed.   QUEtiapine (SEROQUEL) 25 MG tablet Take 1-2 tablets (25-50 mg total) by mouth at bedtime as needed.   No facility-administered medications prior to visit.    ROS        Objective:     BP 107/70 (BP Location: Left Arm, Patient Position: Sitting, Cuff Size: Normal)   Pulse 71   Resp 16   Wt 135 lb (61.2 kg)   SpO2 96%   BMI 20.53 kg/m  BP Readings from Last 3 Encounters:  08/19/24 107/70  07/18/24 109/60  06/18/24 115/75      Physical Exam Constitutional:      Appearance: Normal appearance. He is not ill-appearing.  HENT:     Head: Normocephalic.     Right Ear: Tympanic membrane, ear canal and external ear normal.     Left Ear: Tympanic membrane, ear canal and external ear normal.     Nose: Nose normal.     Mouth/Throat:     Mouth: Mucous membranes are moist.     Pharynx: No oropharyngeal exudate or posterior oropharyngeal erythema.  Eyes:     Pupils: Pupils are equal, round, and reactive to light.  Cardiovascular:     Rate and Rhythm: Normal rate and regular rhythm.     Heart sounds: Normal heart sounds.  Pulmonary:     Effort: Pulmonary effort is normal.     Breath sounds: Normal breath sounds.  Musculoskeletal:     Cervical back: Neck supple.  Skin:    General: Skin is warm.  Neurological:     General: No focal deficit present.     Mental Status: He is alert and oriented to person, place, and time. Mental status is at baseline.  Psychiatric:        Mood and Affect: Mood normal.        Behavior: Behavior normal.        Thought Content: Thought content normal.        Judgment: Judgment normal.      No results found for any visits on 08/19/24. Last CBC Lab Results   Component Value Date   WBC 3.5 06/06/2024   HGB 13.0 06/06/2024   HCT 41.2 06/06/2024   MCV 91 06/06/2024   MCH 28.7 06/06/2024   RDW 13.8 06/06/2024   PLT 141 (L) 06/06/2024   Last metabolic panel Lab Results  Component Value Date   GLUCOSE 73 06/06/2024   NA 137 06/06/2024   K 4.0 06/06/2024   CL 101 06/06/2024   CO2 21 06/06/2024   BUN 11 06/06/2024   CREATININE 0.97 06/06/2024   EGFR 108 06/06/2024   CALCIUM 8.9 06/06/2024   PROT 8.1 06/06/2024   ALBUMIN 3.9 (L) 06/06/2024   LABGLOB 4.2 06/06/2024   BILITOT 0.4 06/06/2024   ALKPHOS 76 06/06/2024   AST 13 06/06/2024   ALT 7 06/06/2024   ANIONGAP 15 05/19/2021   Last lipids Lab Results  Component Value Date   CHOL 126 06/06/2024   HDL 38 (L) 06/06/2024   LDLCALC 76 06/06/2024   TRIG 57 06/06/2024   CHOLHDL  3.3 06/06/2024   Last hemoglobin A1c Lab Results  Component Value Date   HGBA1C 5.5 06/06/2024        Assessment & Plan:     Return to office in one year for next CPE earlier with any new concerns  Reviewed dietary goals, will continue to follow up with ID and BH   Discussed annual flu vaccine will plan to get this week at ID appointment   Routine Health Maintenance and Physical Exam  Immunization History  Administered Date(s) Administered   Hpv-Unspecified 07/09/2010, 04/17/2013   Influenza,inj,Quad PF,6+ Mos 11/21/2019   Janssen (J&J) SARS-COV-2 Vaccination 05/16/2020   Meningococcal Mcv4o 12/14/2020   Pneumococcal Conjugate-13 05/09/2019   Pneumococcal Polysaccharide-23 12/14/2020    Health Maintenance  Topic Date Due   HPV VACCINES (3 - Risk male 3-dose series) 08/18/2013   DTaP/Tdap/Td (1 - Tdap) Never done   Hepatitis B Vaccines 19-59 Average Risk (1 of 3 - 19+ 3-dose series) Never done   COVID-19 Vaccine (2 - Janssen risk series) 06/13/2020   Influenza Vaccine  05/10/2024   Pneumococcal Vaccine (3 of 3 - PCV20 or PCV21) 12/14/2025   Hepatitis C Screening  Completed   HIV  Screening  Completed   Meningococcal B Vaccine  Aged Out    Discussed health benefits of physical activity, and encouraged him to engage in regular exercise appropriate for his age and condition.  Problem List Items Addressed This Visit   None  Return in about 1 year (around 08/19/2025).     Lauraine Kitty, FNP  **Disclaimer: This note may have been dictated with voice recognition software. Similar sounding words can inadvertently be transcribed and this note may contain transcription errors which may not have been corrected upon publication of note.**

## 2024-08-22 ENCOUNTER — Other Ambulatory Visit: Payer: Self-pay

## 2024-08-22 ENCOUNTER — Encounter: Payer: Self-pay | Admitting: Infectious Diseases

## 2024-08-22 ENCOUNTER — Ambulatory Visit: Admitting: Infectious Diseases

## 2024-08-22 VITALS — BP 103/66 | HR 55 | Temp 97.7°F | Ht 69.0 in | Wt 135.0 lb

## 2024-08-22 DIAGNOSIS — K2100 Gastro-esophageal reflux disease with esophagitis, without bleeding: Secondary | ICD-10-CM | POA: Diagnosis not present

## 2024-08-22 DIAGNOSIS — B20 Human immunodeficiency virus [HIV] disease: Secondary | ICD-10-CM

## 2024-08-22 DIAGNOSIS — R1312 Dysphagia, oropharyngeal phase: Secondary | ICD-10-CM | POA: Diagnosis not present

## 2024-08-22 NOTE — Progress Notes (Signed)
 Name: Kelly Knight  DOB: Jul 04, 1996 MRN: [id number] PCP: Kennyth Domino, FNP     Brief Narrative:  Kelly Knight is a 28 y.o. male with HIV disease, Dx 10/2018. CD4 nadir unknown.  HIV Risk: MSM History of OIs: none Intake Labs 01/2019: Hep B sAg (-) sAb (-)  cAb (-); hep A (+); hep c Ab (-) VL 223,000 CD4  RPR 1:2056 --> 1:128 --> 1:64  Quantiferon (-) HLA*B 5701 (-)   Previous Regimens: Tivicay  + Descovy  (needs crushable regimen)   Genotypes: 01/2019: [id number]   Subjective:   Chief Complaint  Patient presents with   Follow-up    Difficulty swallowing tivicay /descovy  - getting about 3 out of 7 doses per week     Discussed the use of AI scribe software for clinical note transcription with the patient, who gave verbal consent to proceed.  History of Present Illness   Kelly Knight is a 28 year old male with HIV who presents for follow-up care.  He experiences significant difficulty with swallowing, leading to gagging and nausea. Swallowing solids is particularly challenging, and he often resorts to consuming ramen noodles or soup. There is a sensation of food being stuck in his throat and stomach, sometimes felt in the back of his throat. Pepcid  and omeprazole  have improved his symptoms, but discomfort persists. He takes Pepcid  twice daily and omeprazole  in the morning.  He has ongoing issues with reflux, which has improved since starting Pepcid . Previously, reflux was severe enough to cause regurgitation through his throat and nose. He continues to experience a sensation of reflux sitting in his stomach and throat, persisting for days at a time. He experiences occasional severe hiccups associated with reflux. These hiccups can be painful and occur unexpectedly, sometimes during activities like using the bathroom.  He is currently on Seroquel 25 mg, which has significantly improved his sleep and mental health. He feels more positive and notes a reduction in stress  and anxiety scores from 26-27 to 16-12.  He is taking Tivicay  and Descovy  for HIV management and has been working on improving adherence to medication, resulting in fewer headaches and increased energy. He is also taking atovaquone  as a temporary measure to support his immune system.  He has been working with a therapist to address PTSD and improve his mental health. He acknowledges a tendency to 'run away' from stressors and is actively working on self-awareness and intervention strategies. He has recently affirmed that he deserves a content life, marking a significant step in his mental health journey.        08/19/2024    1:14 PM 08/15/2024    1:12 PM 07/18/2024   12:00 PM  Depression screen PHQ 2/9  Decreased Interest 2  3  Down, Depressed, Hopeless 2  3  PHQ - 2 Score 4  6  Altered sleeping 1  3  Tired, decreased energy 1  3  Change in appetite 1  3  Feeling bad or failure about yourself  2  3  Trouble concentrating 1  3  Moving slowly or fidgety/restless 1  3  Suicidal thoughts 1  3  PHQ-9 Score 12  27   Difficult doing work/chores Very difficult  Extremely dIfficult     Information is confidential and restricted. Go to Review Flowsheets to unlock data.   Data saved with a previous flowsheet row definition      08/15/2024    1:14 PM 07/18/2024   12:01 PM 07/16/2024   10:52  AM 06/18/2024    1:51 PM  GAD 7 : Generalized Anxiety Score  Nervous, Anxious, on Edge  3  3  Control/stop worrying  3  3  Worry too much - different things  3  3  Trouble relaxing  3  2  Restless  3  3  Easily annoyed or irritable  3  3  Afraid - awful might happen  3  3  Total GAD 7 Score  21  20  Anxiety Difficulty  Extremely difficult  Extremely difficult     Information is confidential and restricted. Go to Review Flowsheets to unlock data.      Lab Results  Component Value Date   CD4TCELL 13 (L) 06/18/2024   CD4TABS 133 (L) 06/18/2024    Wt Readings from Last 3 Encounters:  08/22/24 135  lb (61.2 kg)  08/19/24 135 lb (61.2 kg)  07/18/24 133 lb 12.8 oz (60.7 kg)    Review of Systems  Constitutional:  Negative for appetite change, chills, fatigue, fever and unexpected weight change.  Eyes:  Negative for visual disturbance.  Respiratory:  Negative for cough and shortness of breath.   Cardiovascular:  Negative for chest pain and leg swelling.  Gastrointestinal:  Negative for abdominal pain, diarrhea and nausea.  Genitourinary:  Negative for dysuria, genital sores and penile discharge.  Musculoskeletal:  Negative for joint swelling.  Skin:  Negative for color change and rash.  Neurological:  Negative for dizziness and headaches.  Hematological:  Negative for adenopathy.  Psychiatric/Behavioral:  Positive for agitation. Negative for sleep disturbance. The patient is not nervous/anxious.     Past Medical History:  Diagnosis Date   Asthma    Heart murmur    HIV (human immunodeficiency virus infection) (HCC) 01/23/2019   Situs inversus    Syphilis 01/23/2019    Outpatient Medications Prior to Visit  Medication Sig Dispense Refill   albuterol  (VENTOLIN  HFA) 108 (90 Base) MCG/ACT inhaler Inhale 2 puffs into the lungs every 6 (six) hours as needed for wheezing or shortness of breath. 8 g 1   atovaquone  (MEPRON ) 750 MG/5ML suspension Take 10 mLs (1,500 mg total) by mouth daily with breakfast. 300 mL 2   dolutegravir  (TIVICAY ) 50 MG tablet Take 1 tablet (50 mg total) by mouth daily after supper. 30 tablet 11   emtricitabine -tenofovir  AF (DESCOVY ) 200-25 MG tablet Take 1 tablet by mouth daily after supper. 30 tablet 6   famotidine  (PEPCID ) 20 MG tablet Take 1 tablet (20 mg total) by mouth 2 (two) times daily for 7 days, THEN 1 tablet (20 mg total) 2 (two) times daily as needed. 60 tablet 2   hydrOXYzine  (ATARAX ) 25 MG tablet Take 1 tablet (25 mg total) by mouth 3 (three) times daily as needed for anxiety. 90 tablet 1   ibuprofen  (ADVIL ) 600 MG tablet Take 1 tablet (600 mg total)  by mouth every 6 (six) hours as needed for moderate pain (pain score 4-6). 60 tablet 0   mirtazapine  (REMERON ) 30 MG tablet Take 1 tablet (30 mg total) by mouth at bedtime. 30 tablet 3   nicotine  (NICODERM CQ  - DOSED IN MG/24 HOURS) 14 mg/24hr patch Place 1 patch (14 mg total) onto the skin daily. 28 patch 3   nicotine  polacrilex (NICORETTE ) 4 MG gum Take 1 each (4 mg total) by mouth as needed for smoking cessation. 100 tablet 3   omeprazole  (PRILOSEC) 40 MG capsule Take 1 capsule (40 mg total) by mouth daily 30 minutes before breakfast  AND 1 capsule (40 mg total) daily 30 minutes before supper. 60 capsule 1   ondansetron  (ZOFRAN -ODT) 4 MG disintegrating tablet Take 1 tablet (4 mg total) by mouth every 8 (eight) hours as needed. 20 tablet 0   QUEtiapine (SEROQUEL) 25 MG tablet Take 1-2 tablets (25-50 mg total) by mouth at bedtime as needed. 60 tablet 3   acetaminophen  (TYLENOL ) 325 MG tablet Take 2 tablets (650 mg total) by mouth every 6 (six) hours as needed. (Patient not taking: Reported on 08/22/2024) 100 tablet 0   albuterol  (PROVENTIL ) (2.5 MG/3ML) 0.083% nebulizer solution Take 2.5 mg by nebulization every 6 (six) hours as needed for wheezing.     albuterol  (VENTOLIN  HFA) 108 (90 Base) MCG/ACT inhaler Inhale 2 puffs into the lungs every 6 (six) hours as needed for wheezing or shortness of breath. 18 g 0   dolutegravir  (TIVICAY ) 50 MG tablet Take 1 tablet (50 mg total) by mouth daily. 30 tablet 6   lidocaine  (XYLOCAINE ) 2 % solution Use as directed 15 mLs in the mouth or throat as needed for mouth pain. (Patient not taking: Reported on 08/22/2024) 100 mL 0   No facility-administered medications prior to visit.     Allergies  Allergen Reactions   Shrimp [Shellfish Allergy] Itching and Swelling    Throat swelling   Sulfa Antibiotics Rash    Social History   Tobacco Use   Smoking status: Former    Types: Cigarettes    Passive exposure: Never   Smokeless tobacco: Never   Tobacco  comments:    Stopped vaping   Vaping Use   Vaping status: Former   Substances: THC  Substance Use Topics   Alcohol use: Not Currently    Comment: socially    Drug use: Not Currently    Frequency: 7.0 times per week    Types: Marijuana    Social History   Substance and Sexual Activity  Sexual Activity Not Currently   Partners: Male   Comment: declined condoms     Objective:   Vitals:   08/22/24 0916  BP: 103/66  Pulse: (!) 55  Temp: 97.7 F (36.5 C)  TempSrc: Temporal  SpO2: 99%  Weight: 135 lb (61.2 kg)  Height: 5' 9 (1.753 m)   Body mass index is 19.94 kg/m.  Physical Exam Vitals reviewed.  Constitutional:      Appearance: He is well-developed.     Comments: Seated comfortably in chair during visit.   HENT:     Mouth/Throat:     Dentition: Normal dentition. No dental abscesses.  Cardiovascular:     Rate and Rhythm: Normal rate and regular rhythm.     Heart sounds: Normal heart sounds.  Pulmonary:     Effort: Pulmonary effort is normal.     Breath sounds: Normal breath sounds.  Abdominal:     General: There is no distension.     Palpations: Abdomen is soft.     Tenderness: There is no abdominal tenderness.  Lymphadenopathy:     Cervical: No cervical adenopathy.  Skin:    General: Skin is warm and dry.     Findings: No rash.  Neurological:     Mental Status: He is alert and oriented to person, place, and time.  Psychiatric:        Judgment: Judgment normal.     Lab Results Lab Results  Component Value Date   WBC 3.5 06/06/2024   HGB 13.0 06/06/2024   HCT 41.2 06/06/2024   MCV 91  06/06/2024   PLT 141 (L) 06/06/2024    Lab Results  Component Value Date   CREATININE 0.97 06/06/2024   BUN 11 06/06/2024   NA 137 06/06/2024   K 4.0 06/06/2024   CL 101 06/06/2024   CO2 21 06/06/2024    Lab Results  Component Value Date   ALT 7 06/06/2024   AST 13 06/06/2024   ALKPHOS 76 06/06/2024   BILITOT 0.4 06/06/2024    Lab Results  Component  Value Date   CHOL 126 06/06/2024   HDL 38 (L) 06/06/2024   LDLCALC 76 06/06/2024   TRIG 57 06/06/2024   CHOLHDL 3.3 06/06/2024   HIV 1 RNA Quant  Date Value  06/18/2024 88,100 copies/mL (H)  12/20/2022 84,500 Copies/mL (H)  10/27/2022 98,000 copies/mL (H)   CD4 T Cell Abs (/uL)  Date Value  06/18/2024 133 (L)  10/27/2022 267 (L)  05/05/2022 228 (L)     Assessment & Plan:     Human immunodeficiency virus (HIV) disease - HIV management is complicated by oropharyngeal dysphagia, affecting medication adherence. Current regimen includes Tivicay  and Descovy , but swallowing difficulties persist. Interest in transitioning to Cabenuva injections, but concerns about adherence and potential treatment failure if doses are missed. Current viral load management is suboptimal due to adherence issues. - Crush Tivicay  and Descovy  tablets and mix with water in a small medicine cup for easier swallowing. - Continue atovaquone  until immune system is better built up. - Ordered CD4 count and tested for Cabenuva safety. - Referred to GI for evaluation of oropharyngeal dysphagia.  Gastroesophageal reflux disease with esophagitis and oropharyngeal dysphagia - Persistent GERD symptoms with esophagitis and oropharyngeal dysphagia. Symptoms include reflux, nausea, and difficulty swallowing, particularly with solid foods. Pepcid  and omeprazole  provide some relief. Possible esophageal stricturing or anatomical issue suspected. - Continue Pepcid  twice daily. - Continue omeprazole  in the morning. - Referred to GI for evaluation of esophageal stricturing or anatomical issues. - Encouraged use of Ensure protein shakes for nutrition, ensuring separation from Tivicay  by six hours.  Depressive disorder and post-traumatic stress disorder (PTSD) - Improvement in depressive symptoms with Seroquel, aiding sleep and mood stabilization. Ongoing therapy with Dr. Harl focusing on PTSD and self-awareness. Reports reduced  anxiety and improved mood, though challenges with self-talk and PTSD triggers persist. - Continue Seroquel 25 mg for sleep and mood stabilization. - Continue therapy with Dr. Harl for PTSD management. - Encouraged self-talk and positive affirmations to improve mental health.      No orders of the defined types were placed in this encounter.  Orders Placed This Encounter  Procedures   HIV RNA, RTPCR W/R GT (RTI, PI,INT)   RPR   T-helper cells (CD4) count   Ambulatory referral to Gastroenterology    Referral Priority:   Routine    Referral Type:   Consultation    Referral Reason:   Specialty Services Required    Number of Visits Requested:   1   Return in about 3 months (around 11/22/2024).   Corean Fireman, MSN, NP-C [facility]  [email or web address] Pager: [phone] Office: [phone] [facility] Main Line: [phone]   08/22/24  10:43 AM

## 2024-08-22 NOTE — Patient Instructions (Addendum)
  [facility] - Address: [address] [place] [address], [place], [place] [address] Phone: [phone]  Separate any Ensure supplements by 6 hours after your tivicay

## 2024-08-23 ENCOUNTER — Ambulatory Visit: Payer: Self-pay | Admitting: Infectious Diseases

## 2024-08-23 LAB — T-HELPER CELLS (CD4) COUNT (NOT AT ARMC)
CD4 % Helper T Cell: 12 % — ABNORMAL LOW (ref 33–65)
CD4 T Cell Abs: 114 /uL — ABNORMAL LOW (ref 400–1790)

## 2024-08-26 ENCOUNTER — Other Ambulatory Visit: Payer: Self-pay

## 2024-08-28 ENCOUNTER — Telehealth (HOSPITAL_COMMUNITY): Payer: Self-pay

## 2024-08-28 NOTE — Telephone Encounter (Signed)
 Patient called in tearfully. Patient reports a triggering event happened and would like to speak with provider.

## 2024-08-29 NOTE — Telephone Encounter (Signed)
 Provider attempted to call patient 3 times without success.  Voicemail left to call back with further concerns.

## 2024-08-29 NOTE — Telephone Encounter (Signed)
 Spoke with Kelly Knight, reviewed that immune system is still fragile and that he should continue with atovaquone .   He confirms that crushing and dissolving his Tivicay /Descovy  has been working much better for him and he has not had any issues getting in his doses.   He has not heard from GI yet, but plans to reach out to them tomorrow if he does not hear anything today.   Tiffanni Scarfo, BSN, RN

## 2024-08-30 LAB — HIV-1 INTEGRASE GENOTYPE

## 2024-08-30 LAB — RPR TITER: RPR Titer: 1:16 {titer} — ABNORMAL HIGH

## 2024-08-30 LAB — HIV RNA, RTPCR W/R GT (RTI, PI,INT)
HIV 1 RNA Quant: 154000 {copies}/mL — ABNORMAL HIGH
HIV-1 RNA Quant, Log: 5.19 {Log_copies}/mL — ABNORMAL HIGH

## 2024-08-30 LAB — T PALLIDUM AB: T Pallidum Abs: POSITIVE — AB

## 2024-08-30 LAB — SYPHILIS: RPR W/REFLEX TO RPR TITER AND TREPONEMAL ANTIBODIES, TRADITIONAL SCREENING AND DIAGNOSIS ALGORITHM: RPR Ser Ql: REACTIVE — AB

## 2024-08-30 LAB — HIV-1 GENOTYPE: HIV-1 Genotype: DETECTED — AB

## 2024-09-23 ENCOUNTER — Encounter (HOSPITAL_COMMUNITY): Payer: Self-pay

## 2024-09-25 ENCOUNTER — Ambulatory Visit (HOSPITAL_COMMUNITY): Admitting: Mental Health

## 2024-10-17 ENCOUNTER — Encounter (HOSPITAL_COMMUNITY): Admitting: Psychiatry

## 2024-11-11 ENCOUNTER — Other Ambulatory Visit: Payer: Self-pay

## 2024-11-20 ENCOUNTER — Ambulatory Visit: Admitting: Infectious Diseases

## 2025-08-18 ENCOUNTER — Ambulatory Visit: Admitting: Nurse Practitioner
